# Patient Record
Sex: Female | Born: 1945
Health system: Southern US, Community
[De-identification: ages and names within clinical notes are randomized; demographics above are authoritative.]

## PROBLEM LIST (undated history)

## (undated) DIAGNOSIS — E782 Mixed hyperlipidemia: Secondary | ICD-10-CM

## (undated) DIAGNOSIS — Z974 Presence of external hearing-aid: Secondary | ICD-10-CM

## (undated) DIAGNOSIS — F32A Depression, unspecified: Secondary | ICD-10-CM

## (undated) DIAGNOSIS — E039 Hypothyroidism, unspecified: Secondary | ICD-10-CM

## (undated) DIAGNOSIS — I340 Nonrheumatic mitral (valve) insufficiency: Secondary | ICD-10-CM

## (undated) DIAGNOSIS — I5189 Other ill-defined heart diseases: Secondary | ICD-10-CM

## (undated) DIAGNOSIS — R911 Solitary pulmonary nodule: Secondary | ICD-10-CM

## (undated) DIAGNOSIS — I509 Heart failure, unspecified: Secondary | ICD-10-CM

## (undated) DIAGNOSIS — I10 Essential (primary) hypertension: Secondary | ICD-10-CM

## (undated) DIAGNOSIS — I071 Rheumatic tricuspid insufficiency: Secondary | ICD-10-CM

## (undated) DIAGNOSIS — I428 Other cardiomyopathies: Secondary | ICD-10-CM

## (undated) DIAGNOSIS — E079 Disorder of thyroid, unspecified: Secondary | ICD-10-CM

## (undated) HISTORY — DX: Disorder of thyroid, unspecified: E07.9

## (undated) HISTORY — DX: Solitary pulmonary nodule: R91.1

## (undated) HISTORY — DX: Nonrheumatic mitral (valve) insufficiency: I34.0

## (undated) HISTORY — DX: Rheumatic tricuspid insufficiency: I07.1

## (undated) HISTORY — PX: TONSILLECTOMY: SUR1361

## (undated) HISTORY — DX: Depression, unspecified: F32.A

## (undated) HISTORY — DX: Other cardiomyopathies: I42.8

## (undated) HISTORY — DX: Mixed hyperlipidemia: E78.2

## (undated) HISTORY — PX: APPENDECTOMY: SHX54

---

## 2013-12-26 HISTORY — PX: COLONOSCOPY: SHX174

## 2013-12-26 HISTORY — PX: LAPAROSCOPIC ILEOCECECTOMY: SHX5898

## 2014-03-26 DIAGNOSIS — M7500 Adhesive capsulitis of unspecified shoulder: Secondary | ICD-10-CM | POA: Insufficient documentation

## 2014-08-27 DIAGNOSIS — Z86010 Personal history of colonic polyps: Secondary | ICD-10-CM | POA: Insufficient documentation

## 2014-09-08 ENCOUNTER — Ambulatory Visit: Payer: Self-pay | Admitting: Gastroenterology

## 2014-10-09 ENCOUNTER — Ambulatory Visit: Payer: Self-pay | Admitting: Surgery

## 2014-10-09 LAB — CBC WITH DIFFERENTIAL/PLATELET
BASOS ABS: 0.1 10*3/uL (ref 0.0–0.1)
Basophil %: 1.3 %
Eosinophil #: 0.2 10*3/uL (ref 0.0–0.7)
Eosinophil %: 3.4 %
HCT: 39.8 % (ref 35.0–47.0)
HGB: 12.8 g/dL (ref 12.0–16.0)
LYMPHS ABS: 1.7 10*3/uL (ref 1.0–3.6)
Lymphocyte %: 34.9 %
MCH: 30.5 pg (ref 26.0–34.0)
MCHC: 32.3 g/dL (ref 32.0–36.0)
MCV: 95 fL (ref 80–100)
MONO ABS: 0.5 x10 3/mm (ref 0.2–0.9)
Monocyte %: 10.3 %
NEUTROS ABS: 2.5 10*3/uL (ref 1.4–6.5)
Neutrophil %: 50.1 %
PLATELETS: 228 10*3/uL (ref 150–440)
RBC: 4.21 10*6/uL (ref 3.80–5.20)
RDW: 12.4 % (ref 11.5–14.5)
WBC: 4.9 10*3/uL (ref 3.6–11.0)

## 2014-10-09 LAB — HEPATIC FUNCTION PANEL A (ARMC)
ALT: 19 U/L
Albumin: 3.7 g/dL (ref 3.4–5.0)
Alkaline Phosphatase: 76 U/L
BILIRUBIN TOTAL: 0.3 mg/dL (ref 0.2–1.0)
Bilirubin, Direct: <0.1 mg/dL (ref 0.00–0.20)
SGOT(AST): 12 U/L — ABNORMAL LOW (ref 15–37)
TOTAL PROTEIN: 7 g/dL (ref 6.4–8.2)

## 2014-10-09 LAB — BASIC METABOLIC PANEL
ANION GAP: 3 — AB (ref 7–16)
BUN: 13 mg/dL (ref 7–18)
CHLORIDE: 106 mmol/L (ref 98–107)
CREATININE: 0.66 mg/dL (ref 0.60–1.30)
Calcium, Total: 8.7 mg/dL (ref 8.5–10.1)
Co2: 31 mmol/L (ref 21–32)
EGFR (African American): >60
EGFR (Non-African Amer.): >60
Glucose: 73 mg/dL (ref 65–99)
OSMOLALITY: 278 (ref 275–301)
Potassium: 4.3 mmol/L (ref 3.5–5.1)
Sodium: 140 mmol/L (ref 136–145)

## 2014-10-13 ENCOUNTER — Inpatient Hospital Stay: Payer: Self-pay | Admitting: Surgery

## 2014-10-14 LAB — CBC WITH DIFFERENTIAL/PLATELET
Basophil #: 0 10*3/uL (ref 0.0–0.1)
Basophil %: 0.5 %
EOS ABS: 0 10*3/uL (ref 0.0–0.7)
EOS PCT: 0.3 %
HCT: 37.4 % (ref 35.0–47.0)
HGB: 12.6 g/dL (ref 12.0–16.0)
Lymphocyte #: 0.8 10*3/uL — ABNORMAL LOW (ref 1.0–3.6)
Lymphocyte %: 10.6 %
MCH: 31.2 pg (ref 26.0–34.0)
MCHC: 33.8 g/dL (ref 32.0–36.0)
MCV: 92 fL (ref 80–100)
MONO ABS: 0.3 x10 3/mm (ref 0.2–0.9)
Monocyte %: 3.8 %
NEUTROS ABS: 6.5 10*3/uL (ref 1.4–6.5)
Neutrophil %: 84.8 %
Platelet: 214 10*3/uL (ref 150–440)
RBC: 4.05 10*6/uL (ref 3.80–5.20)
RDW: 12.4 % (ref 11.5–14.5)
WBC: 7.7 10*3/uL (ref 3.6–11.0)

## 2014-10-14 LAB — BASIC METABOLIC PANEL
Anion Gap: 8 (ref 7–16)
BUN: 5 mg/dL — ABNORMAL LOW (ref 7–18)
CREATININE: 0.56 mg/dL — AB (ref 0.60–1.30)
Calcium, Total: 7.6 mg/dL — ABNORMAL LOW (ref 8.5–10.1)
Chloride: 96 mmol/L — ABNORMAL LOW (ref 98–107)
Co2: 28 mmol/L (ref 21–32)
Glucose: 101 mg/dL — ABNORMAL HIGH (ref 65–99)
OSMOLALITY: 262 (ref 275–301)
POTASSIUM: 3.8 mmol/L (ref 3.5–5.1)
Sodium: 132 mmol/L — ABNORMAL LOW (ref 136–145)

## 2015-01-15 DIAGNOSIS — Z1231 Encounter for screening mammogram for malignant neoplasm of breast: Secondary | ICD-10-CM | POA: Diagnosis not present

## 2015-03-30 DIAGNOSIS — D485 Neoplasm of uncertain behavior of skin: Secondary | ICD-10-CM | POA: Diagnosis not present

## 2015-03-30 DIAGNOSIS — Z08 Encounter for follow-up examination after completed treatment for malignant neoplasm: Secondary | ICD-10-CM | POA: Diagnosis not present

## 2015-03-30 DIAGNOSIS — Z85828 Personal history of other malignant neoplasm of skin: Secondary | ICD-10-CM | POA: Diagnosis not present

## 2015-03-30 DIAGNOSIS — Z1283 Encounter for screening for malignant neoplasm of skin: Secondary | ICD-10-CM | POA: Diagnosis not present

## 2015-03-30 DIAGNOSIS — B078 Other viral warts: Secondary | ICD-10-CM | POA: Diagnosis not present

## 2015-03-30 DIAGNOSIS — Z789 Other specified health status: Secondary | ICD-10-CM | POA: Diagnosis not present

## 2015-03-30 DIAGNOSIS — L570 Actinic keratosis: Secondary | ICD-10-CM | POA: Diagnosis not present

## 2015-04-18 NOTE — Discharge Summary (Signed)
PATIENT NAME:  Krista Peters, Krista Peters MR#:  [id number] DATE OF BIRTH:  08-18-1946  DATE OF ADMISSION:  10/13/2014 DATE OF DISCHARGE:  10/17/2014  DISCHARGE DIAGNOSES: 1.  Unresectable cecal polyp serrated status post laparoscopic ileocecectomy.  2.  History of irritable bowel syndrome.  3.  History of macular degeneration.  4.  History of urinary incontinence.  5.  History of tonsillectomy.   DISCHARGE MEDICATIONS ARE AS FOLLOWS:  Oxybutynin 5 p.o. daily, multivitamin one cap p.o. daily, Wellbutrin 150 mg p.o. daily, hyoscyamine 0.375 p.o. q. 12 hours p.r.n.   INDICATION FOR ADMISSION:  [name] is a pleasant 69 year old who was noted on colonoscopy to have a serrated polyp that was unresectable at her appendiceal orifice and her cecum. She was brought to the operating room for management of unresectable polyp with risk for transition to malignancy.   HOSPITAL COURSE AS FOLLOWS:  [name] underwent unremarkable colectomy on October 19. She was initially given clear liquids and IV fluids and PCA for pain. As she began having bowel function, she was advanced to a regular diet and PCA was converted to p.o. pain medications. At the time of discharge, she was not requiring any p.o. pain medications and was eating and having bowel movements, which were unremarkable.   DISCHARGE INSTRUCTIONS:  [name] is to follow up with me in approximately one week. She is to call or return to the ED if she has increased pain, nausea, vomiting, or redness or drainage from her incision.    ____________________________ Glena Norfolk Traver Meckes, MD cal:nb D: 10/27/2014 20:07:35 ET T: 10/28/2014 01:27:46 ET JOB#: [id number]  cc: Harrell Gave A. Hersel Mcmeen, MD, <Dictator> Floyde Parkins MD ELECTRONICALLY SIGNED 10/28/2014 7:07

## 2015-04-18 NOTE — Op Note (Signed)
PATIENT NAME:  Krista Peters, Krista Peters MR#:  [id number] DATE OF BIRTH:  1946/03/26  DATE OF PROCEDURE:  10/13/2014  REASON FOR SURGERY: Unresectable serrated appendiceal orifice polyp.   PROCEDURE PERFORMED:  Laparoscopic-assisted ileocecectomy.    ASSISTANT:  Nestor Lewandowsky, MD.    ANESTHESIA: General.   ESTIMATED BLOOD LOSS: 50 mL.     COMPLICATIONS: None.   SPECIMEN: Ileocecum.    INDICATION FOR SURGERY: Miss Plant is a pleasant 69 year old who presented with an unresectable serrated polyp at the base of her appendix. She was brought to the operating room for definitive resection of polyp which could require a limited colon resection.   DETAILS OF PROCEDURE:  Informed consent was obtained. [name] was brought to the operating room suite. She was induced. Endotracheal tube was placed, general anesthesia was administered. Her abdomen was prepped and draped in standard surgical fashion. A timeout was then performed correctly identifying the patient name, operative site, and procedure to be performed. An infraumbilical incision was made, this was deepened down to fascia. The fascia was incised. The peritoneum was entered. Two stay sutures were placed through the fasciotomy. The Hasson trocar was placed in the abdomen. The colon was evaluated and after placing a left lower quadrant 5 mm and a suprapubic 5 mm the right colon was mobilized up to the hepatic flexure and to the transverse flexure. After significant mobilization a small midline incision was made. The colon was brought out through this incision and the cecum was removed. It was ligated proximally and distally just proximal to the right colic arcade with a GIA 75 stapler. LigaSure and a harmonic scalpel was used to ligate the ileocolic vessel and after removal a GIA 75 was used to create a common channel between the terminal ileum and the right colon. After the common channel was created the enterocolostomy was closed with a TA 60 stapler.  A crotch stitch was placed to decrease tension on the anastomosis.  The anastomosis was then dropped in the belly.  The abdomen was irrigated with normal saline. The fascia was then closed with a running looped number 1 PDS ran from one direction and tied to itself at the other direction. Staples were then used to close the skin. Staples were used to close the skin at the previous port site. Sterile dressing was then placed over the wound. The patient was then awoken, extubated, and brought to the postanesthesia care unit. There were no immediate complications. Needle, sponge, and instrument counts were correct at the end of the procedure.    ____________________________ Glena Norfolk. Romney Compean, MD cal:bu D: 10/15/2014 09:36:58 ET T: 10/15/2014 16:12:54 ET JOB#: [id number]  cc: Harrell Gave A. Leyland Kenna, MD, <Dictator> Floyde Parkins MD ELECTRONICALLY SIGNED 10/28/2014 7:02

## 2015-04-20 LAB — SURGICAL PATHOLOGY

## 2015-05-29 ENCOUNTER — Other Ambulatory Visit: Payer: Self-pay | Admitting: Internal Medicine

## 2015-09-11 ENCOUNTER — Encounter: Payer: Self-pay | Admitting: Internal Medicine

## 2015-09-11 ENCOUNTER — Other Ambulatory Visit: Payer: Self-pay | Admitting: Internal Medicine

## 2015-09-11 ENCOUNTER — Other Ambulatory Visit: Payer: Self-pay

## 2015-09-11 ENCOUNTER — Ambulatory Visit (INDEPENDENT_AMBULATORY_CARE_PROVIDER_SITE_OTHER): Payer: Medicare Other | Admitting: Internal Medicine

## 2015-09-11 VITALS — BP 128/78 | HR 84 | Ht 66.0 in | Wt 130.4 lb

## 2015-09-11 DIAGNOSIS — N3941 Urge incontinence: Secondary | ICD-10-CM | POA: Insufficient documentation

## 2015-09-11 DIAGNOSIS — E781 Pure hyperglyceridemia: Secondary | ICD-10-CM | POA: Insufficient documentation

## 2015-09-11 DIAGNOSIS — K589 Irritable bowel syndrome without diarrhea: Secondary | ICD-10-CM | POA: Insufficient documentation

## 2015-09-11 DIAGNOSIS — E782 Mixed hyperlipidemia: Secondary | ICD-10-CM | POA: Insufficient documentation

## 2015-09-11 DIAGNOSIS — Z23 Encounter for immunization: Secondary | ICD-10-CM | POA: Diagnosis not present

## 2015-09-11 DIAGNOSIS — H04123 Dry eye syndrome of bilateral lacrimal glands: Secondary | ICD-10-CM | POA: Diagnosis not present

## 2015-09-11 DIAGNOSIS — F329 Major depressive disorder, single episode, unspecified: Secondary | ICD-10-CM

## 2015-09-11 DIAGNOSIS — S0300XA Dislocation of jaw, unspecified side, initial encounter: Secondary | ICD-10-CM

## 2015-09-11 DIAGNOSIS — F325 Major depressive disorder, single episode, in full remission: Secondary | ICD-10-CM | POA: Insufficient documentation

## 2015-09-11 MED ORDER — TIZANIDINE HCL 2 MG PO TABS: 2.0000 mg | ORAL_TABLET | Freq: Every day | ORAL | Status: DC

## 2015-09-11 NOTE — Progress Notes (Signed)
Date:  09/11/2015   Name:  Krista Peters   DOB:  09/08/1946   MRN:  [id number]   Chief Complaint: Headache Headache  This is a new problem. The current episode started more than 1 month ago. The problem has been unchanged. The pain is located in the occipital region. The quality of the pain is described as aching. The pain is moderate (worse in the am). Pertinent negatives include no coughing, eye pain, eye redness, fever or sore throat. Her past medical history is significant for TMJ. There is no history of hypertension, recent head traumas or sinus disease.   Dry mouth - patient complains of a dry mouth. She had a complete dental exam last week which was normal. Her mouth is not so dry to keep her from speaking. She does keep water with her at all times but is unsure how much she consumes in a day. She does take a hycosamine and ditropan but has been on them for some time.  Dry eyes -patient complains of slightly dry eyes she is aware of this problem from previous eye doctor visits. No disease has been diagnosed she has been recommended to use moisturizing eyedrops.  Review of Systems:  Review of Systems  Constitutional: Negative for fever, diaphoresis and fatigue.  HENT: Negative for dental problem, mouth sores and sore throat.   Eyes: Negative for pain, redness and visual disturbance.       Dry eyes  Respiratory: Negative for cough and shortness of breath.   Cardiovascular: Negative for chest pain, palpitations and leg swelling.  Skin: Negative for color change, rash and wound.       Dry, wrinkled skin.  Neurological: Positive for headaches.    Patient Active Problem List   Diagnosis Date Noted  . Urge incontinence of urine 09/11/2015  . Depression 09/11/2015  . Hypertriglyceridemia 09/11/2015  . Irritable bowel syndrome without diarrhea 09/11/2015  . Hx of adenomatous colonic polyps 08/27/2014  . Adhesive capsulitis 03/26/2014    Prior to Admission medications   Medication  Sig Start Date End Date Taking? Authorizing Provider  buPROPion (WELLBUTRIN XL) 150 MG 24 hr tablet TAKE 1 TABLET BY MOUTH DAILY 05/29/15  Yes [name], MD  hyoscyamine (LEVBID) 0.375 MG 12 hr tablet Take 0.375 mg by mouth. 08/02/12  Yes Historical Provider, MD  oxybutynin (DITROPAN) 5 MG tablet TAKE 1 TABLET BY MOUTH EVERY DAY 09/11/15  Yes [name], MD    Allergies  Allergen Reactions  . Naproxen Rash  . Penicillins Rash  . Pneumovax 23 [Pneumococcal Vac Polyvalent] Rash    Past Surgical History  Procedure Laterality Date  . Tonsillectomy    . Laparoscopic ileocecectomy  2015    Serrated adenoma  . Colonoscopy  2015    Large adenoma removed laparoscopically    Social History  Substance Use Topics  . Smoking status: Never Smoker   . Smokeless tobacco: None  . Alcohol Use: 1.2 oz/week    2 Standard drinks or equivalent per week     Medication list has been reviewed and updated.  Physical Examination:  Physical Exam  Constitutional: She is oriented to person, place, and time. She appears well-developed and well-nourished. No distress.  HENT:  Mouth/Throat: Mucous membranes are normal. Mucous membranes are not dry.    Neck: Normal range of motion. Neck supple. Carotid bruit is not present. No thyromegaly present.  Cardiovascular: Normal rate, regular rhythm and normal heart sounds.   Pulmonary/Chest: Effort normal and breath  sounds normal.  Musculoskeletal: She exhibits no edema.  TMJ joint click and abnormal excursion noted bilaterally No temporal artery tenderness or scalp muscle spasm appreciated  Neurological: She is alert and oriented to person, place, and time.  Skin: Skin is warm and dry. She is not diaphoretic. No pallor.  Slightly decreased skin turgor noted on both hands. No other skin abnormality is noted.    BP 128/78 mmHg  Pulse 84  Ht 5\' 6"  (1.676 m)  Wt 130 lb 6.4 oz (59.149 kg)  BMI 21.06 kg/m2  Assessment and Plan: 1. TMJ  (dislocation of temporomandibular joint), initial encounter Continue to wear bite guard at night - tiZANidine (ZANAFLEX) 2 MG tablet; Take 1 tablet (2 mg total) by mouth at bedtime.  Dispense: 30 tablet; Refill: 2  2. Need for influenza vaccination - Flu Vaccine QUAD 36+ mos IM  3. Dry eye, bilateral Continue moisturizing eyedrops as needed   Halina Maidens, MD [facility]  09/11/2015

## 2015-10-06 DIAGNOSIS — H353132 Nonexudative age-related macular degeneration, bilateral, intermediate dry stage: Secondary | ICD-10-CM | POA: Diagnosis not present

## 2015-10-13 ENCOUNTER — Other Ambulatory Visit: Payer: Self-pay | Admitting: Internal Medicine

## 2015-10-21 DIAGNOSIS — Z1283 Encounter for screening for malignant neoplasm of skin: Secondary | ICD-10-CM | POA: Diagnosis not present

## 2015-10-21 DIAGNOSIS — Z08 Encounter for follow-up examination after completed treatment for malignant neoplasm: Secondary | ICD-10-CM | POA: Diagnosis not present

## 2015-10-21 DIAGNOSIS — Z85828 Personal history of other malignant neoplasm of skin: Secondary | ICD-10-CM | POA: Diagnosis not present

## 2015-10-21 DIAGNOSIS — L821 Other seborrheic keratosis: Secondary | ICD-10-CM | POA: Diagnosis not present

## 2015-10-27 ENCOUNTER — Other Ambulatory Visit: Payer: Self-pay | Admitting: Internal Medicine

## 2016-01-08 ENCOUNTER — Other Ambulatory Visit: Payer: Self-pay | Admitting: Internal Medicine

## 2016-02-17 ENCOUNTER — Ambulatory Visit (INDEPENDENT_AMBULATORY_CARE_PROVIDER_SITE_OTHER): Payer: Medicare Other

## 2016-02-17 ENCOUNTER — Encounter: Payer: Self-pay | Admitting: Emergency Medicine

## 2016-02-17 ENCOUNTER — Ambulatory Visit
Admission: EM | Admit: 2016-02-17 | Discharge: 2016-02-17 | Disposition: A | Discharge status: Home / Self Care | Payer: Medicare Other | Attending: Family Medicine | Admitting: Family Medicine

## 2016-02-17 DIAGNOSIS — K219 Gastro-esophageal reflux disease without esophagitis: Secondary | ICD-10-CM | POA: Insufficient documentation

## 2016-02-17 DIAGNOSIS — R079 Chest pain, unspecified: Secondary | ICD-10-CM | POA: Diagnosis not present

## 2016-02-17 DIAGNOSIS — Z880 Allergy status to penicillin: Secondary | ICD-10-CM | POA: Insufficient documentation

## 2016-02-17 DIAGNOSIS — R51 Headache: Secondary | ICD-10-CM | POA: Diagnosis present

## 2016-02-17 DIAGNOSIS — Z79899 Other long term (current) drug therapy: Secondary | ICD-10-CM | POA: Diagnosis not present

## 2016-02-17 NOTE — ED Notes (Signed)
Pt reports awoke in middle of night with a sensation in chest described as burning. Pt reports resolved after taking tums but after going back to sleep awoke again with pressure in mid upper back described as intermittent. Pt denies pressure at this time reports "I just don't feel right and I have had this headache" Pt denies SOB or similar symptoms previously

## 2016-02-17 NOTE — ED Provider Notes (Signed)
CSN: [id number]     Arrival date & time 02/17/16  [id number] History   None    Chief Complaint  Patient presents with  . Headache   (Consider location/radiation/quality/duration/timing/severity/associated sxs/prior Treatment) HPI Comments: 70 yo female with a h/o GERD presents with a c/o burning chest sensation episodes (twice) last night. States pain seemed to radiate towards the back. Denies any palpitations, cough, injury, fevers, chills, diaphoresis. Currently not having any chest symptoms. States currently has a mild headache.  The history is provided by the patient.    No past medical history on file. Past Surgical History  Procedure Laterality Date  . Tonsillectomy    . Laparoscopic ileocecectomy  2015    Serrated adenoma  . Colonoscopy  2015    Large adenoma removed laparoscopically  . Appendectomy     Family History  Problem Relation Age of Onset  . Diabetes Mother   . Diabetes Brother   . CAD Father   . CAD Brother    Social History  Substance Use Topics  . Smoking status: Never Smoker   . Smokeless tobacco: None  . Alcohol Use: 1.2 oz/week    2 Standard drinks or equivalent per week   OB History    No data available     Review of Systems  Allergies  Naproxen; Penicillins; and Pneumovax 23  Home Medications   Prior to Admission medications   Medication Sig Start Date End Date Taking? Authorizing Provider  Multiple Vitamins-Minerals (PRESERVISION AREDS) CAPS Take by mouth.   Yes Historical Provider, MD  buPROPion (WELLBUTRIN XL) 150 MG 24 hr tablet TAKE 1 TABLET BY MOUTH EVERY DAY 10/27/15   [name], MD  hyoscyamine (LEVBID) 0.375 MG 12 hr tablet Take 0.375 mg by mouth. 08/02/12   Historical Provider, MD  hyoscyamine (LEVSIN, ANASPAZ) 0.125 MG tablet TAKE 1 TABLET BY MOUTH FOUR TIMES DAILY AS NEEDED 01/08/16   [name], MD  oxybutynin (DITROPAN) 5 MG tablet TAKE 1 TABLET BY MOUTH EVERY DAY 10/13/15   [name], MD  tiZANidine (ZANAFLEX) 2  MG tablet Take 1 tablet (2 mg total) by mouth at bedtime. 09/11/15   [name], MD   Meds Ordered and Administered this Visit  Medications - No data to display  BP 129/71 mmHg  Pulse 97  Temp(Src) 98.6 F (37 C) (Tympanic)  Resp 16  Ht 5\' 6"  (1.676 m)  Wt 130 lb (58.968 kg)  BMI 20.99 kg/m2  SpO2 100% No data found.   Physical Exam  Constitutional: She appears well-developed and well-nourished. No distress.  HENT:  Head: Normocephalic and atraumatic.  Right Ear: External ear normal.  Left Ear: External ear normal.  Nose: Nose normal. No nose lacerations, sinus tenderness, nasal deformity, septal deviation or nasal septal hematoma. No epistaxis.  No foreign bodies.  Mouth/Throat: Uvula is midline, oropharynx is clear and moist and mucous membranes are normal. No oropharyngeal exudate.  Eyes: Conjunctivae and EOM are normal. Pupils are equal, round, and reactive to light. Right eye exhibits no discharge. Left eye exhibits no discharge. No scleral icterus.  Neck: Normal range of motion. Neck supple. No thyromegaly present.  Cardiovascular: Normal rate, regular rhythm and normal heart sounds.   Pulmonary/Chest: Effort normal and breath sounds normal. No respiratory distress. She has no wheezes. She has no rales. She exhibits no tenderness.  Abdominal: Soft. Bowel sounds are normal. She exhibits no distension and no mass. There is no tenderness. There is no rebound and no  guarding.  Lymphadenopathy:    She has no cervical adenopathy.  Skin: She is not diaphoretic.  Nursing note and vitals reviewed.   ED Course  Procedures (including critical care time)  Labs Review Labs Reviewed - No data to display  Imaging Review Dg Chest 2 View  02/17/2016  CLINICAL DATA:  Upper back discomfort, former smoking history, chest pain EXAM: CHEST  2 VIEW COMPARISON:  Chest x-ray of 10/09/2014 FINDINGS: No active infiltrate or effusion is seen. The lungs are slightly hyperaerated.  Mediastinal and hilar contours are unremarkable. The heart is within normal limits in size. There are mild degenerative changes in the mid to lower thoracic spine. IMPRESSION: No active cardiopulmonary disease. Electronically Signed   By: Ivar Drape M.D.   On: 02/17/2016 10:10     Visual Acuity Review  Right Eye Distance:   Left Eye Distance:   Bilateral Distance:    Right Eye Near:   Left Eye Near:    Bilateral Near:       EKG: there are no previous tracings available for comparison, normal sinus rhythm; reviewed by me and agree   MDM   1. Gastroesophageal reflux disease, esophagitis presence not specified    Discharge Medication List as of 02/17/2016 11:09 AM     1. EKG and x-ray results and diagnosis reviewed with patient 2. rx as per orders above; reviewed possible side effects, interactions, risks and benefits  3. Recommend supportive treatment with otc H2 blocker or PPI prn 4. Follow-up prn if symptoms worsen or don't improve    Norval Gable, MD 02/17/16 2214

## 2016-07-07 ENCOUNTER — Ambulatory Visit (INDEPENDENT_AMBULATORY_CARE_PROVIDER_SITE_OTHER): Payer: Medicare Other | Admitting: Internal Medicine

## 2016-07-07 ENCOUNTER — Encounter: Payer: Self-pay | Admitting: Internal Medicine

## 2016-07-07 VITALS — BP 122/84 | HR 77 | Resp 16 | Ht 66.0 in | Wt 133.8 lb

## 2016-07-07 DIAGNOSIS — F329 Major depressive disorder, single episode, unspecified: Secondary | ICD-10-CM | POA: Diagnosis not present

## 2016-07-07 DIAGNOSIS — E781 Pure hyperglyceridemia: Secondary | ICD-10-CM | POA: Diagnosis not present

## 2016-07-07 DIAGNOSIS — Z1159 Encounter for screening for other viral diseases: Secondary | ICD-10-CM

## 2016-07-07 DIAGNOSIS — Z1239 Encounter for other screening for malignant neoplasm of breast: Secondary | ICD-10-CM

## 2016-07-07 DIAGNOSIS — Z0000 Encounter for general adult medical examination without abnormal findings: Secondary | ICD-10-CM | POA: Diagnosis not present

## 2016-07-07 DIAGNOSIS — K589 Irritable bowel syndrome without diarrhea: Secondary | ICD-10-CM | POA: Diagnosis not present

## 2016-07-07 DIAGNOSIS — F32A Depression, unspecified: Secondary | ICD-10-CM

## 2016-07-07 LAB — POCT URINALYSIS DIPSTICK
BILIRUBIN UA: NEGATIVE
GLUCOSE UA: NEGATIVE
Ketones, UA: NEGATIVE
Leukocytes, UA: NEGATIVE
Nitrite, UA: NEGATIVE
Protein, UA: NEGATIVE
RBC UA: NEGATIVE
SPEC GRAV UA: 1.01
pH, UA: 7

## 2016-07-07 MED ORDER — HYOSCYAMINE SULFATE 0.125 MG PO TABS: ORAL_TABLET | ORAL | Status: DC

## 2016-07-07 NOTE — Progress Notes (Signed)
Patient: Krista Peters, Female    DOB: 05/01/1946, 70 y.o.   MRN: [id number] Visit Date: 07/07/2016  Today's Provider: Halina Maidens, MD   Chief Complaint  Patient presents with  . Medicare Wellness   Subjective:    Annual wellness visit Krista Peters is a 70 y.o. female who presents today for her Subsequent Annual Wellness Visit. She feels well. She reports exercising walking. She reports she is sleeping well.   ----------------------------------------------------------- Depression        This is a chronic problem.  The problem has been resolved since onset.  Associated symptoms include no fatigue and no headaches.  Past treatments include other medications.  Compliance with treatment is good.  Previous treatment provided significant relief. Abdominal Cramping This is a recurrent problem. The problem occurs intermittently. The pain is located in the generalized abdominal region (diagnoses as IBS). Pertinent negatives include no arthralgias, constipation, diarrhea, dysuria, fever, frequency, headaches or vomiting. Treatments tried: Levsin PRN. Prior diagnostic workup includes lower endoscopy.    Review of Systems  Constitutional: Negative for fever, chills and fatigue.  HENT: Negative for congestion, hearing loss, tinnitus, trouble swallowing and voice change.   Eyes: Negative for visual disturbance.  Respiratory: Negative for cough, chest tightness, shortness of breath and wheezing.   Cardiovascular: Negative for chest pain, palpitations and leg swelling.  Gastrointestinal: Positive for abdominal distention. Negative for vomiting, abdominal pain, diarrhea and constipation.  Endocrine: Negative for polydipsia and polyuria.  Genitourinary: Negative for dysuria, frequency, vaginal bleeding, vaginal discharge and genital sores.  Musculoskeletal: Negative for joint swelling, arthralgias and gait problem.  Skin: Negative for color change and rash.  Neurological: Negative for  dizziness, tremors, light-headedness and headaches.  Hematological: Negative for adenopathy. Does not bruise/bleed easily.  Psychiatric/Behavioral: Positive for depression. Negative for sleep disturbance and dysphoric mood. The patient is not nervous/anxious.     Social History   Social History  . Marital Status: Married    Spouse Name: N/A  . Number of Children: N/A  . Years of Education: N/A   Occupational History  . Not on file.   Social History Main Topics  . Smoking status: Never Smoker   . Smokeless tobacco: Not on file  . Alcohol Use: 1.2 oz/week    2 Standard drinks or equivalent per week  . Drug Use: Not on file  . Sexual Activity: Not on file   Other Topics Concern  . Not on file   Social History Narrative    Patient Active Problem List   Diagnosis Date Noted  . Urge incontinence of urine 09/11/2015  . Depression 09/11/2015  . Hypertriglyceridemia 09/11/2015  . Irritable bowel syndrome without diarrhea 09/11/2015  . Hx of adenomatous colonic polyps 08/27/2014  . Adhesive capsulitis 03/26/2014    Past Surgical History  Procedure Laterality Date  . Tonsillectomy    . Laparoscopic ileocecectomy  2015    Serrated adenoma  . Colonoscopy  2015    Large adenoma removed laparoscopically  . Appendectomy      Her family history includes CAD in her brother and father; Diabetes in her brother and mother.    Previous Medications   BUPROPION (WELLBUTRIN XL) 150 MG 24 HR TABLET    TAKE 1 TABLET BY MOUTH EVERY DAY   HYOSCYAMINE (LEVBID) 0.375 MG 12 HR TABLET    Take 0.375 mg by mouth.   HYOSCYAMINE (LEVSIN, ANASPAZ) 0.125 MG TABLET    TAKE 1 TABLET BY MOUTH FOUR TIMES DAILY AS NEEDED  MULTIPLE VITAMINS-MINERALS (PRESERVISION AREDS) CAPS    Take by mouth.   OXYBUTYNIN (DITROPAN) 5 MG TABLET    TAKE 1 TABLET BY MOUTH EVERY DAY   TIZANIDINE (ZANAFLEX) 2 MG TABLET    Take 1 tablet (2 mg total) by mouth at bedtime.    Patient Care Team: [name], MD as PCP  - General (Family Medicine)     Objective:   Vitals: BP 122/84 mmHg  Pulse 77  Resp 16  Ht 5\' 6"  (1.676 m)  Wt 133 lb 12.8 oz (60.691 kg)  BMI 21.61 kg/m2  SpO2 97%  Physical Exam  Constitutional: She is oriented to person, place, and time. She appears well-developed and well-nourished. No distress.  HENT:  Head: Normocephalic and atraumatic.  Right Ear: Tympanic membrane and ear canal normal.  Left Ear: Tympanic membrane and ear canal normal.  Nose: Right sinus exhibits no maxillary sinus tenderness. Left sinus exhibits no maxillary sinus tenderness.  Mouth/Throat: Uvula is midline and oropharynx is clear and moist.  Eyes: Conjunctivae and EOM are normal. Right eye exhibits no discharge. Left eye exhibits no discharge. No scleral icterus.  Neck: Normal range of motion. Carotid bruit is not present. No erythema present. No thyromegaly present.  Cardiovascular: Normal rate, regular rhythm, normal heart sounds and normal pulses.   Pulmonary/Chest: Effort normal. No respiratory distress. She has no wheezes. Right breast exhibits no mass, no nipple discharge, no skin change and no tenderness. Left breast exhibits no mass, no nipple discharge, no skin change and no tenderness.  Abdominal: Soft. Bowel sounds are normal. There is no hepatosplenomegaly. There is generalized tenderness. There is no rigidity, no guarding and no CVA tenderness.  Musculoskeletal: Normal range of motion.  Lymphadenopathy:    She has no cervical adenopathy.    She has no axillary adenopathy.  Neurological: She is alert and oriented to person, place, and time. She has normal reflexes. No cranial nerve deficit or sensory deficit.  Skin: Skin is warm, dry and intact. No rash noted.  Psychiatric: She has a normal mood and affect. Her speech is normal and behavior is normal. Thought content normal.  Nursing note and vitals reviewed.   Activities of Daily Living In your present state of health, do you have any  difficulty performing the following activities: 07/07/2016 09/11/2015  Hearing? Tempie Donning  Vision? N N  Difficulty concentrating or making decisions? N N  Walking or climbing stairs? N N  Dressing or bathing? N N  Doing errands, shopping? N N  Preparing Food and eating ? N -  Using the Toilet? N -  In the past six months, have you accidently leaked urine? N -  Do you have problems with loss of bowel control? N -  Managing your Medications? N -  Managing your Finances? N -  Housekeeping or managing your Housekeeping? N -    Fall Risk Assessment Fall Risk  07/07/2016 09/11/2015  Falls in the past year? No No      Depression Screen PHQ 2/9 Scores 07/07/2016 09/11/2015  PHQ - 2 Score 0 1    Cognitive Testing - 6-CIT   Correct? Score   What year is it? yes 0 Yes = 0    No = 4  What month is it? yes 0 Yes = 0    No = 3  Remember:     Pia [address]au, [place], [place]     What time is it? yes 0 Yes = 0  No = 3  Count backwards from 20 to 1 yes 0 Correct = 0    1 error = 2   More than 1 error = 4  Say the months of the year in reverse. yes 0 Correct = 0    1 error = 2   More than 1 error = 4  What address did I ask you to remember? yes 0 Correct = 0  1 error = 2    2 error = 4    3 error = 6    4 error = 8    All wrong = 10       TOTAL SCORE  0/28   Interpretation:  Normal  Normal (0-7) Abnormal (8-28)        Medicare Annual Wellness Visit Summary:  Reviewed patient's Family Medical History Reviewed and updated list of patient's medical providers Assessment of cognitive impairment was done Assessed patient's functional ability Established a written schedule for health screening [place] Completed and Reviewed  Exercise Activities and Dietary recommendations Goals    None      Immunization History  Administered Date(s) Administered  . Influenza,inj,Quad PF,36+ Mos 09/11/2015  . Pneumococcal Polysaccharide-23 12/28/2011  . Tdap 12/27/2010  .  Zoster 07/26/2012    Health Maintenance  Topic Date Due  . Hepatitis C Screening  1946-11-03  . DEXA SCAN  07/13/2012  . PNA vac Low Risk Adult (1 of 2 - PCV13) 12/27/2012  . INFLUENZA VACCINE  07/26/2016  . MAMMOGRAM  01/15/2017  . TETANUS/TDAP  12/27/2020  . COLONOSCOPY  08/26/2024  . ZOSTAVAX  Completed     Discussed health benefits of physical activity, and encouraged her to engage in regular exercise appropriate for her age and condition.    ------------------------------------------------------------------------------------------------------------   Assessment & Plan:  1. Medicare annual wellness visit, subsequent Measures satisfied DEXA discussed - pt intolerant of bisphosphonates and not eager to take other medications - will continue high calcium diet and vitamin D - POCT urinalysis dipstick  2. Irritable bowel syndrome without diarrhea - hyoscyamine (LEVSIN, ANASPAZ) 0.125 MG tablet; TAKE 1 TABLET BY MOUTH FOUR TIMES DAILY AS NEEDED  Dispense: 30 tablet; Refill: 2 - CBC with Differential/Platelet - Comprehensive metabolic panel  3. Need for hepatitis C screening test - Hepatitis C antibody  4. Breast cancer screening Patient will schedule Mammogram annually at [facility]  5. Depression Doing well on current therapy - TSH  6. Hypertriglyceridemia Continue healthy diet and exercise - Lipid panel   Halina Maidens, MD [facility]  07/07/2016

## 2016-07-07 NOTE — Patient Instructions (Signed)
Health Maintenance  Topic Date Due  . MAMMOGRAM  01/16/2016  . DEXA SCAN  07/07/2017 (Originally 07/13/2012)  . PNA vac Low Risk Adult (1 of 2 - PCV13) 07/07/2026 (Originally 12/27/2012)  . INFLUENZA VACCINE  07/26/2016  . TETANUS/TDAP  12/27/2020  . COLONOSCOPY  08/26/2024  . ZOSTAVAX  Completed  . Hepatitis C Screening  Addressed   Breast Self-Awareness Practicing breast self-awareness may pick up problems early, prevent significant medical complications, and possibly save your life. By practicing breast self-awareness, you can become familiar with how your breasts look and feel and if your breasts are changing. This allows you to notice changes early. It can also offer you some reassurance that your breast health is good. One way to learn what is normal for your breasts and whether your breasts are changing is to do a breast self-exam. If you find a lump or something that was not present in the past, it is best to contact your caregiver right away. Other findings that should be evaluated by your caregiver include nipple discharge, especially if it is bloody; skin changes or reddening; areas where the skin seems to be pulled in (retracted); or new lumps and bumps. Breast pain is seldom associated with cancer (malignancy), but should also be evaluated by a caregiver. HOW TO PERFORM A BREAST SELF-EXAM The best time to examine your breasts is 5-7 days after your menstrual period is over. During menstruation, the breasts are lumpier, and it may be more difficult to pick up changes. If you do not menstruate, have reached menopause, or had your uterus removed (hysterectomy), you should examine your breasts at regular intervals, such as monthly. If you are breastfeeding, examine your breasts after a feeding or after using a breast pump. Breast implants do not decrease the risk for lumps or tumors, so continue to perform breast self-exams as recommended. Talk to your caregiver about how to determine the  difference between the implant and breast tissue. Also, talk about the amount of pressure you should use during the exam. Over time, you will become more familiar with the variations of your breasts and more comfortable with the exam. A breast self-exam requires you to remove all your clothes above the waist. 1. Look at your breasts and nipples. Stand in front of a mirror in a room with good lighting. With your hands on your hips, push your hands firmly downward. Look for a difference in shape, contour, and size from one breast to the other (asymmetry). Asymmetry includes puckers, dips, or bumps. Also, look for skin changes, such as reddened or scaly areas on the breasts. Look for nipple changes, such as discharge, dimpling, repositioning, or redness. 2. Carefully feel your breasts. This is best done either in the shower or tub while using soapy water or when flat on your back. Place the arm (on the side of the breast you are examining) above your head. Use the pads (not the fingertips) of your three middle fingers on your opposite hand to feel your breasts. Start in the underarm area and use  inch (2 cm) overlapping circles to feel your breast. Use 3 different levels of pressure (light, medium, and firm pressure) at each circle before moving to the next circle. The light pressure is needed to feel the tissue closest to the skin. The medium pressure will help to feel breast tissue a little deeper, while the firm pressure is needed to feel the tissue close to the ribs. Continue the overlapping circles, moving downward over  the breast until you feel your ribs below your breast. Then, move one finger-width towards the center of the body. Continue to use the  inch (2 cm) overlapping circles to feel your breast as you move slowly up toward the collar bone (clavicle) near the base of the neck. Continue the up and down exam using all 3 pressures until you reach the middle of the chest. Do this with each breast,  carefully feeling for lumps or changes. 3.  Keep a written record with breast changes or normal findings for each breast. By writing this information down, you do not need to depend only on memory for size, tenderness, or location. Write down where you are in your menstrual cycle, if you are still menstruating. Breast tissue can have some lumps or thick tissue. However, see your caregiver if you find anything that concerns you.  SEEK MEDICAL CARE IF:  You see a change in shape, contour, or size of your breasts or nipples.   You see skin changes, such as reddened or scaly areas on the breasts or nipples.   You have an unusual discharge from your nipples.   You feel a new lump or unusually thick areas.    This information is not intended to replace advice given to you by your health care provider. Make sure you discuss any questions you have with your health care provider.   Document Released: 12/12/2005 Document Revised: 11/28/2012 Document Reviewed: 03/28/2012 Elsevier Interactive Patient Education [employer].

## 2016-07-08 LAB — CBC WITH DIFFERENTIAL/PLATELET
BASOS: 1 %
Basophils Absolute: 0 10*3/uL (ref 0.0–0.2)
EOS (ABSOLUTE): 0.2 10*3/uL (ref 0.0–0.4)
EOS: 5 %
HEMATOCRIT: 41.4 % (ref 34.0–46.6)
HEMOGLOBIN: 13.3 g/dL (ref 11.1–15.9)
IMMATURE GRANS (ABS): 0 10*3/uL (ref 0.0–0.1)
Immature Granulocytes: 0 %
LYMPHS: 32 %
Lymphocytes Absolute: 1.5 10*3/uL (ref 0.7–3.1)
MCH: 30.6 pg (ref 26.6–33.0)
MCHC: 32.1 g/dL (ref 31.5–35.7)
MCV: 95 fL (ref 79–97)
MONOCYTES: 9 %
Monocytes Absolute: 0.4 10*3/uL (ref 0.1–0.9)
NEUTROS ABS: 2.5 10*3/uL (ref 1.4–7.0)
Neutrophils: 53 %
Platelets: 278 10*3/uL (ref 150–379)
RBC: 4.34 x10E6/uL (ref 3.77–5.28)
RDW: 13.2 % (ref 12.3–15.4)
WBC: 4.7 10*3/uL (ref 3.4–10.8)

## 2016-07-08 LAB — TSH: TSH: 1.63 u[IU]/mL (ref 0.450–4.500)

## 2016-07-08 LAB — HEPATITIS C ANTIBODY

## 2016-07-08 LAB — LIPID PANEL
CHOL/HDL RATIO: 3.1 ratio (ref 0.0–4.4)
Cholesterol, Total: 197 mg/dL (ref 100–199)
HDL: 63 mg/dL (ref >39)
LDL Calculated: 116 mg/dL — ABNORMAL HIGH (ref 0–99)
TRIGLYCERIDES: 91 mg/dL (ref 0–149)
VLDL Cholesterol Cal: 18 mg/dL (ref 5–40)

## 2016-07-08 LAB — COMPREHENSIVE METABOLIC PANEL
A/G RATIO: 1.7 (ref 1.2–2.2)
ALBUMIN: 4.3 g/dL (ref 3.6–4.8)
ALT: 11 IU/L (ref 0–32)
AST: 12 IU/L (ref 0–40)
Alkaline Phosphatase: 82 IU/L (ref 39–117)
BILIRUBIN TOTAL: 0.4 mg/dL (ref 0.0–1.2)
BUN/Creatinine Ratio: 18 (ref 12–28)
BUN: 13 mg/dL (ref 8–27)
CHLORIDE: 98 mmol/L (ref 96–106)
CO2: 26 mmol/L (ref 18–29)
Calcium: 9.2 mg/dL (ref 8.7–10.3)
Creatinine, Ser: 0.72 mg/dL (ref 0.57–1.00)
GFR calc non Af Amer: 86 mL/min/{1.73_m2} (ref >59)
GFR, EST AFRICAN AMERICAN: 100 mL/min/{1.73_m2} (ref >59)
Globulin, Total: 2.6 g/dL (ref 1.5–4.5)
Glucose: 71 mg/dL (ref 65–99)
POTASSIUM: 4.5 mmol/L (ref 3.5–5.2)
Sodium: 140 mmol/L (ref 134–144)
TOTAL PROTEIN: 6.9 g/dL (ref 6.0–8.5)

## 2016-07-18 DIAGNOSIS — Z1231 Encounter for screening mammogram for malignant neoplasm of breast: Secondary | ICD-10-CM | POA: Diagnosis not present

## 2016-07-27 ENCOUNTER — Encounter: Payer: Self-pay | Admitting: Internal Medicine

## 2016-09-16 ENCOUNTER — Other Ambulatory Visit: Payer: Self-pay | Admitting: Internal Medicine

## 2016-09-16 ENCOUNTER — Telehealth: Payer: Self-pay | Admitting: Internal Medicine

## 2016-09-16 DIAGNOSIS — M810 Age-related osteoporosis without current pathological fracture: Secondary | ICD-10-CM

## 2016-09-16 NOTE — Telephone Encounter (Signed)
Pt called said she would like to have a bone density test done said she would take meds if it was bad enough, pt said she isn't having any symptoms at this time.

## 2016-09-20 ENCOUNTER — Ambulatory Visit (INDEPENDENT_AMBULATORY_CARE_PROVIDER_SITE_OTHER): Payer: Medicare Other

## 2016-09-20 DIAGNOSIS — Z23 Encounter for immunization: Secondary | ICD-10-CM

## 2016-09-27 ENCOUNTER — Ambulatory Visit
Admission: RE | Admit: 2016-09-27 | Discharge: 2016-09-27 | Disposition: A | Discharge status: Home / Self Care | Payer: Medicare Other | Source: Ambulatory Visit | Attending: Internal Medicine | Admitting: Internal Medicine

## 2016-09-27 DIAGNOSIS — E2839 Other primary ovarian failure: Secondary | ICD-10-CM | POA: Diagnosis present

## 2016-09-27 DIAGNOSIS — M810 Age-related osteoporosis without current pathological fracture: Secondary | ICD-10-CM | POA: Diagnosis not present

## 2016-09-27 DIAGNOSIS — Z780 Asymptomatic menopausal state: Secondary | ICD-10-CM | POA: Diagnosis not present

## 2016-10-11 DIAGNOSIS — H353132 Nonexudative age-related macular degeneration, bilateral, intermediate dry stage: Secondary | ICD-10-CM | POA: Diagnosis not present

## 2016-10-12 ENCOUNTER — Other Ambulatory Visit: Payer: Self-pay | Admitting: Internal Medicine

## 2016-10-21 ENCOUNTER — Encounter: Payer: Self-pay | Admitting: Internal Medicine

## 2016-10-21 ENCOUNTER — Telehealth: Payer: Self-pay

## 2016-10-21 ENCOUNTER — Other Ambulatory Visit: Payer: Self-pay | Admitting: Internal Medicine

## 2016-10-21 DIAGNOSIS — M810 Age-related osteoporosis without current pathological fracture: Secondary | ICD-10-CM

## 2016-10-21 MED ORDER — ALENDRONATE SODIUM 70 MG PO TABS: 70.0000 mg | ORAL_TABLET | ORAL | 11 refills | Status: DC

## 2016-10-21 NOTE — Telephone Encounter (Signed)
Advised to add 1200 mg calcium to diet and add vit D suppl. 800mg  to meds. She can not do calcium OTC has to be in food only. Will start Fosomax please call in.

## 2016-10-26 ENCOUNTER — Other Ambulatory Visit: Payer: Self-pay | Admitting: Internal Medicine

## 2016-10-27 DIAGNOSIS — D485 Neoplasm of uncertain behavior of skin: Secondary | ICD-10-CM | POA: Diagnosis not present

## 2016-10-27 DIAGNOSIS — L821 Other seborrheic keratosis: Secondary | ICD-10-CM | POA: Diagnosis not present

## 2016-10-27 DIAGNOSIS — L570 Actinic keratosis: Secondary | ICD-10-CM | POA: Diagnosis not present

## 2016-10-27 DIAGNOSIS — D225 Melanocytic nevi of trunk: Secondary | ICD-10-CM | POA: Diagnosis not present

## 2016-10-27 DIAGNOSIS — D1801 Hemangioma of skin and subcutaneous tissue: Secondary | ICD-10-CM | POA: Diagnosis not present

## 2017-01-23 ENCOUNTER — Other Ambulatory Visit: Payer: Self-pay | Admitting: Internal Medicine

## 2017-01-23 NOTE — Telephone Encounter (Signed)
pts coming in on 7/26 for her cpe

## 2017-07-07 ENCOUNTER — Other Ambulatory Visit: Payer: Self-pay | Admitting: Internal Medicine

## 2017-07-20 ENCOUNTER — Encounter: Payer: Self-pay | Admitting: Internal Medicine

## 2017-07-29 ENCOUNTER — Other Ambulatory Visit: Payer: Self-pay | Admitting: Internal Medicine

## 2017-08-07 DIAGNOSIS — L821 Other seborrheic keratosis: Secondary | ICD-10-CM | POA: Diagnosis not present

## 2017-08-07 DIAGNOSIS — L570 Actinic keratosis: Secondary | ICD-10-CM | POA: Diagnosis not present

## 2017-08-07 DIAGNOSIS — Z859 Personal history of malignant neoplasm, unspecified: Secondary | ICD-10-CM | POA: Diagnosis not present

## 2017-08-07 DIAGNOSIS — L308 Other specified dermatitis: Secondary | ICD-10-CM | POA: Diagnosis not present

## 2017-08-11 DIAGNOSIS — Z1231 Encounter for screening mammogram for malignant neoplasm of breast: Secondary | ICD-10-CM | POA: Diagnosis not present

## 2017-08-26 ENCOUNTER — Other Ambulatory Visit: Payer: Self-pay | Admitting: Internal Medicine

## 2017-08-26 DIAGNOSIS — M810 Age-related osteoporosis without current pathological fracture: Secondary | ICD-10-CM

## 2017-08-31 ENCOUNTER — Ambulatory Visit (INDEPENDENT_AMBULATORY_CARE_PROVIDER_SITE_OTHER): Payer: Medicare Other | Admitting: Internal Medicine

## 2017-08-31 ENCOUNTER — Encounter: Payer: Self-pay | Admitting: Internal Medicine

## 2017-08-31 VITALS — BP 132/84 | HR 68 | Ht 66.0 in | Wt 137.2 lb

## 2017-08-31 DIAGNOSIS — K589 Irritable bowel syndrome without diarrhea: Secondary | ICD-10-CM | POA: Diagnosis not present

## 2017-08-31 DIAGNOSIS — Z23 Encounter for immunization: Secondary | ICD-10-CM | POA: Diagnosis not present

## 2017-08-31 DIAGNOSIS — E781 Pure hyperglyceridemia: Secondary | ICD-10-CM

## 2017-08-31 DIAGNOSIS — M810 Age-related osteoporosis without current pathological fracture: Secondary | ICD-10-CM

## 2017-08-31 DIAGNOSIS — Z86010 Personal history of colonic polyps: Secondary | ICD-10-CM | POA: Diagnosis not present

## 2017-08-31 DIAGNOSIS — F324 Major depressive disorder, single episode, in partial remission: Secondary | ICD-10-CM | POA: Diagnosis not present

## 2017-08-31 DIAGNOSIS — Z0000 Encounter for general adult medical examination without abnormal findings: Secondary | ICD-10-CM

## 2017-08-31 DIAGNOSIS — Z860101 Personal history of adenomatous and serrated colon polyps: Secondary | ICD-10-CM

## 2017-08-31 LAB — POCT URINALYSIS DIPSTICK
Bilirubin, UA: NEGATIVE
Blood, UA: NEGATIVE
Glucose, UA: NEGATIVE
KETONES UA: NEGATIVE
NITRITE UA: NEGATIVE
PH UA: 6.5 (ref 5.0–8.0)
PROTEIN UA: NEGATIVE
SPEC GRAV UA: 1.01 (ref 1.010–1.025)
Urobilinogen, UA: 0.2 E.U./dL

## 2017-08-31 NOTE — Patient Instructions (Signed)
Health Maintenance  Topic Date Due  . INFLUENZA VACCINE  09/25/2017 (Originally 07/26/2017)  . PNA vac Low Risk Adult (1 of 2 - PCV13) 07/07/2026 (Originally 12/27/2012)  . MAMMOGRAM  08/11/2018  . TETANUS/TDAP  12/27/2020  . COLONOSCOPY  08/26/2024  . DEXA SCAN  Completed  . Hepatitis C Screening  Completed

## 2017-08-31 NOTE — Progress Notes (Signed)
Patient: Krista Peters, Female    DOB: Mar 02, 1946, 71 y.o.   MRN: [id number] Visit Date: 08/31/2017  Today's Provider: Halina Maidens, MD   Chief Complaint  Patient presents with  . Medicare Wellness    PHQ9- 5. Feeling tired and little interest in doing things. Breast Exam.    Subjective:    Annual wellness visit Krista Peters is a 71 y.o. female who presents today for her Subsequent Annual Wellness Visit. She feels fairly well. She reports exercising walking daily. She reports she is sleeping well. Recent mammogram was normal.  ----------------------------------------------------------- HPI Osteoporosis - Patient had a DEXA scan last fall which showed osteoporosis. She started on Fosamax 70 mg weekly. She tolerated fairly well but feels as if it causes some mild stomach irritation and upset. Similar to episodes she had a number of years ago with a medication-induced pancreatitis. She denies vomiting, difficulty swallowing, or heartburn. IBS - patient also has irritable bowel syndrome for which she takes hycosamine prn. Stools are normal. No vomiting weight loss or other change in symptoms. Last colonoscopy was 3 years ago requiring surgical removal of a very large polyp. She is now due for follow-up colonoscopy. Mood disorder - currently on Wellbutrin and doing well. She sleeps well in her juice fairly good although she flushes slowed down a little bit. No chest pains palpitations or change in appetite or sleep.   Review of Systems  Constitutional: Negative for chills, fatigue, fever and unexpected weight change.  HENT: Negative for congestion, hearing loss, tinnitus, trouble swallowing and voice change.   Eyes: Negative for visual disturbance.  Respiratory: Negative for cough, chest tightness, shortness of breath and wheezing.   Cardiovascular: Negative for chest pain, palpitations and leg swelling.  Gastrointestinal: Positive for nausea. Negative for abdominal pain, blood in  stool, constipation, diarrhea and vomiting.  Endocrine: Negative for polydipsia and polyuria.  Genitourinary: Negative for dysuria, frequency, genital sores, vaginal bleeding and vaginal discharge.  Musculoskeletal: Negative for arthralgias, gait problem and joint swelling.  Skin: Negative for color change and rash.  Neurological: Negative for dizziness, tremors, light-headedness and headaches.  Hematological: Negative for adenopathy. Does not bruise/bleed easily.  Psychiatric/Behavioral: Negative for dysphoric mood, sleep disturbance and suicidal ideas. The patient is not nervous/anxious.     Social History   Social History  . Marital status: Married    Spouse name: N/A  . Number of children: N/A  . Years of education: N/A   Occupational History  . Not on file.   Social History Main Topics  . Smoking status: Never Smoker  . Smokeless tobacco: Never Used  . Alcohol use 1.2 oz/week    2 Standard drinks or equivalent per week  . Drug use: Unknown  . Sexual activity: Not on file   Other Topics Concern  . Not on file   Social History Narrative  . No narrative on file    Patient Active Problem List   Diagnosis Date Noted  . Osteoporosis of multiple sites without pathological fracture 10/21/2016  . Urge incontinence of urine 09/11/2015  . Depression 09/11/2015  . Hypertriglyceridemia 09/11/2015  . Irritable bowel syndrome without diarrhea 09/11/2015  . Hx of adenomatous colonic polyps 08/27/2014  . Adhesive capsulitis 03/26/2014    Past Surgical History:  Procedure Laterality Date  . APPENDECTOMY    . COLONOSCOPY  2015   Large adenoma removed laparoscopically  . LAPAROSCOPIC ILEOCECECTOMY  2015   Serrated adenoma  . TONSILLECTOMY      Her  family history includes CAD in her brother and father; Diabetes in her brother and mother.     Previous Medications   ALENDRONATE (FOSAMAX) 70 MG TABLET    TAKE 1 TABLET(70 MG) BY MOUTH EVERY 7 DAYS WITH A FULL GLASS OF WATER  AND ON AN EMPTY STOMACH   BUPROPION (WELLBUTRIN XL) 150 MG 24 HR TABLET    TAKE 1 TABLET BY MOUTH EVERY DAY   HYOSCYAMINE (LEVSIN, ANASPAZ) 0.125 MG TABLET    TAKE 1 TABLET BY MOUTH FOUR TIMES DAILY AS NEEDED   MULTIPLE VITAMINS-MINERALS (PRESERVISION AREDS) CAPS    Take by mouth.   OXYBUTYNIN (DITROPAN) 5 MG TABLET    TAKE 1 TABLET BY MOUTH EVERY DAY    Patient Care Team: [name], MD as PCP - General (Family Medicine) [facility] (Dermatology)      Objective:   Vitals: BP 132/84   Pulse 68   Ht 5\' 6"  (1.676 m)   Wt 137 lb 3.2 oz (62.2 kg)   BMI 22.14 kg/m   Physical Exam  Constitutional: She is oriented to person, place, and time. She appears well-developed and well-nourished. No distress.  HENT:  Head: Normocephalic and atraumatic.  Right Ear: Tympanic membrane and ear canal normal.  Left Ear: Tympanic membrane and ear canal normal.  Nose: Right sinus exhibits no maxillary sinus tenderness. Left sinus exhibits no maxillary sinus tenderness.  Mouth/Throat: Uvula is midline and oropharynx is clear and moist.  Eyes: Conjunctivae and EOM are normal. Right eye exhibits no discharge. Left eye exhibits no discharge. No scleral icterus.  Neck: Normal range of motion. Carotid bruit is not present. No erythema present. No thyromegaly present.  Cardiovascular: Normal rate, regular rhythm, normal heart sounds and normal pulses.   Pulmonary/Chest: Effort normal. No respiratory distress. She has no wheezes. Right breast exhibits no mass, no nipple discharge, no skin change and no tenderness. Left breast exhibits no mass, no nipple discharge, no skin change and no tenderness.  Abdominal: Soft. Bowel sounds are normal. There is no hepatosplenomegaly. There is no tenderness. There is no CVA tenderness.  Musculoskeletal: Normal range of motion.  Lymphadenopathy:    She has no cervical adenopathy.    She has no axillary adenopathy.  Neurological: She is alert and  oriented to person, place, and time. She has normal reflexes. No cranial nerve deficit or sensory deficit.  Skin: Skin is warm, dry and intact. No rash noted.  Psychiatric: She has a normal mood and affect. Her speech is normal and behavior is normal. Thought content normal.  Nursing note and vitals reviewed.   Activities of Daily Living In your present state of health, do you have any difficulty performing the following activities: 08/31/2017 08/31/2017  Hearing? N Y  Comment - Hearing Aids in Both ears.   Vision? N N  Comment - Pt wears glasses.   Difficulty concentrating or making decisions? N N  Walking or climbing stairs? N N  Dressing or bathing? N N  Doing errands, shopping? N N  Preparing Food and eating ? N -  Using the Toilet? N -  In the past six months, have you accidently leaked urine? Y -  Do you have problems with loss of bowel control? N -  Comment BM- comes out fast but smaller peices but otherwise normal.  -  Managing your Medications? N -  Managing your Finances? N -  Housekeeping or managing your Housekeeping? N -  Some recent data might be hidden  Fall Risk Assessment Fall Risk  08/31/2017 07/07/2016 09/11/2015  Falls in the past year? No No No     Depression Screen PHQ 2/9 Scores 08/31/2017 07/07/2016 09/11/2015  PHQ - 2 Score 2 0 1  PHQ- 9 Score 5 - -    6CIT Screen 08/31/2017  What Year? 0 points  What month? 0 points  What time? 0 points  Count back from 20 0 points  Months in reverse 0 points  Repeat phrase 0 points  Total Score 0    Medicare Annual Wellness Visit Summary:  Reviewed patient's Family Medical History Reviewed and updated list of patient's medical providers Assessment of cognitive impairment was done Assessed patient's functional ability Established a written schedule for health screening [place] Completed and Reviewed  Exercise Activities and Dietary recommendations Goals    None      Immunization  History  Administered Date(s) Administered  . Influenza,inj,Quad PF,6+ Mos 09/11/2015, 09/20/2016  . Pneumococcal Polysaccharide-23 12/28/2011  . Tdap 12/27/2010  . Zoster 07/26/2012    Health Maintenance  Topic Date Due  . INFLUENZA VACCINE  09/25/2017 (Originally 07/26/2017)  . PNA vac Low Risk Adult (1 of 2 - PCV13) 07/07/2026 (Originally 12/27/2012)  . MAMMOGRAM  08/11/2018  . TETANUS/TDAP  12/27/2020  . COLONOSCOPY  08/26/2024  . DEXA SCAN  Completed  . Hepatitis C Screening  Completed    Discussed health benefits of physical activity, and encouraged her to engage in regular exercise appropriate for her age and condition.    ------------------------------------------------------------------------------------------------------------  Assessment & Plan:  1. Medicare annual wellness visit, subsequent Measures satisfied - POCT Urinalysis Dipstick  2. Irritable bowel syndrome without diarrhea stable - CBC with Differential/Platelet - Comprehensive metabolic panel  3. Osteoporosis of multiple sites without pathological fracture Possible side effects to fosamax - recommend stopping for several months Continue vitamin D, high calcium diet and resume regular exercise  4. Hypertriglyceridemia Continue diet control - Lipid panel  5. Major depressive disorder with single episode, in partial remission ([place]) Doing fairly well on current medication No change for now - TSH  6. Need for influenza vaccination - Flu Vaccine QUAD 36+ mos IM  7. Hx of adenomatous colonic polyps - Ambulatory referral to Gastroenterology   No orders of the defined types were placed in this encounter.   Partially dictated using Editor, commissioning. Any errors are unintentional.  Halina Maidens, MD [facility]  08/31/2017

## 2017-09-01 LAB — LIPID PANEL
CHOLESTEROL TOTAL: 206 mg/dL — AB (ref 100–199)
Chol/HDL Ratio: 3.3 ratio (ref 0.0–4.4)
HDL: 63 mg/dL (ref >39)
LDL Calculated: 127 mg/dL — ABNORMAL HIGH (ref 0–99)
Triglycerides: 80 mg/dL (ref 0–149)
VLDL Cholesterol Cal: 16 mg/dL (ref 5–40)

## 2017-09-01 LAB — COMPREHENSIVE METABOLIC PANEL
ALK PHOS: 67 IU/L (ref 39–117)
ALT: 13 IU/L (ref 0–32)
AST: 19 IU/L (ref 0–40)
Albumin/Globulin Ratio: 1.7 (ref 1.2–2.2)
Albumin: 4.5 g/dL (ref 3.5–4.8)
BUN/Creatinine Ratio: 17 (ref 12–28)
BUN: 12 mg/dL (ref 8–27)
Bilirubin Total: 0.4 mg/dL (ref 0.0–1.2)
CO2: 25 mmol/L (ref 20–29)
Calcium: 9.1 mg/dL (ref 8.7–10.3)
Chloride: 98 mmol/L (ref 96–106)
Creatinine, Ser: 0.72 mg/dL (ref 0.57–1.00)
GFR calc Af Amer: 98 mL/min/{1.73_m2} (ref >59)
GFR calc non Af Amer: 85 mL/min/{1.73_m2} (ref >59)
GLUCOSE: 80 mg/dL (ref 65–99)
Globulin, Total: 2.6 g/dL (ref 1.5–4.5)
Potassium: 4.5 mmol/L (ref 3.5–5.2)
Sodium: 140 mmol/L (ref 134–144)
Total Protein: 7.1 g/dL (ref 6.0–8.5)

## 2017-09-01 LAB — CBC WITH DIFFERENTIAL/PLATELET
BASOS ABS: 0 10*3/uL (ref 0.0–0.2)
Basos: 1 %
EOS (ABSOLUTE): 0.2 10*3/uL (ref 0.0–0.4)
Eos: 5 %
HEMOGLOBIN: 13.4 g/dL (ref 11.1–15.9)
Hematocrit: 41.1 % (ref 34.0–46.6)
Immature Grans (Abs): 0 10*3/uL (ref 0.0–0.1)
Immature Granulocytes: 0 %
LYMPHS ABS: 1.8 10*3/uL (ref 0.7–3.1)
Lymphs: 34 %
MCH: 31.5 pg (ref 26.6–33.0)
MCHC: 32.6 g/dL (ref 31.5–35.7)
MCV: 97 fL (ref 79–97)
MONOCYTES: 10 %
MONOS ABS: 0.5 10*3/uL (ref 0.1–0.9)
Neutrophils Absolute: 2.6 10*3/uL (ref 1.4–7.0)
Neutrophils: 50 %
PLATELETS: 264 10*3/uL (ref 150–379)
RBC: 4.26 x10E6/uL (ref 3.77–5.28)
RDW: 13.5 % (ref 12.3–15.4)
WBC: 5.2 10*3/uL (ref 3.4–10.8)

## 2017-09-01 LAB — TSH: TSH: 1.3 u[IU]/mL (ref 0.450–4.500)

## 2017-09-18 ENCOUNTER — Telehealth: Payer: Self-pay | Admitting: Gastroenterology

## 2017-09-18 NOTE — Telephone Encounter (Signed)
Patient called to schedule her colonoscopy with Dr. Allen Norris

## 2017-09-19 NOTE — Telephone Encounter (Signed)
Returned patient's call to schedule.  LVM for callback.

## 2017-10-05 ENCOUNTER — Other Ambulatory Visit: Payer: Self-pay

## 2017-10-05 ENCOUNTER — Telehealth: Payer: Self-pay

## 2017-10-05 DIAGNOSIS — Z86010 Personal history of colonic polyps: Secondary | ICD-10-CM

## 2017-10-05 NOTE — Telephone Encounter (Signed)
Gastroenterology Pre-Procedure Review  Request Date: 11/12 Requesting Physician: Dr. Allen Norris  PATIENT REVIEW QUESTIONS: The patient responded to the following health history questions as indicated:    1. Are you having any GI issues? no 2. Do you have a personal history of Polyps? yes (3 yrs prior) 3. Do you have a family history of Colon Cancer or Polyps? no 4. Diabetes Mellitus? no 5. Joint replacements in the past 12 months?no 6. Major health problems in the past 3 months?no 7. Any artificial heart valves, MVP, or defibrillator?no    MEDICATIONS & ALLERGIES:    Patient reports the following regarding taking any anticoagulation/antiplatelet therapy:   Plavix, Coumadin, Eliquis, Xarelto, Lovenox, Pradaxa, Brilinta, or Effient? no Aspirin? nO  Patient confirms/reports the following medications:  Current Outpatient Prescriptions  Medication Sig Dispense Refill  . alendronate (FOSAMAX) 70 MG tablet TAKE 1 TABLET(70 MG) BY MOUTH EVERY 7 DAYS WITH A FULL GLASS OF WATER AND ON AN EMPTY STOMACH 12 tablet 11  . buPROPion (WELLBUTRIN XL) 150 MG 24 hr tablet TAKE 1 TABLET BY MOUTH EVERY DAY 90 tablet 0  . hyoscyamine (LEVSIN, ANASPAZ) 0.125 MG tablet TAKE 1 TABLET BY MOUTH FOUR TIMES DAILY AS NEEDED 30 tablet 2  . Multiple Vitamins-Minerals (PRESERVISION AREDS) CAPS Take by mouth.    . oxybutynin (DITROPAN) 5 MG tablet TAKE 1 TABLET BY MOUTH EVERY DAY 90 tablet 0   No current facility-administered medications for this visit.     Patient confirms/reports the following allergies:  Allergies  Allergen Reactions  . Naproxen Rash  . Penicillins Rash  . Pneumovax 23 [Pneumococcal Vac Polyvalent] Rash    No orders of the defined types were placed in this encounter.   AUTHORIZATION INFORMATION Primary Insurance: 1D#: Group #:  Secondary Insurance: 1D#: Group #:  SCHEDULE INFORMATION: Date: 11/12 Time: Location: [place]

## 2017-10-11 ENCOUNTER — Other Ambulatory Visit: Payer: Self-pay | Admitting: Internal Medicine

## 2017-10-25 ENCOUNTER — Other Ambulatory Visit: Payer: Self-pay | Admitting: Internal Medicine

## 2017-11-03 NOTE — Discharge Instructions (Signed)
General Anesthesia, Adult, Care After °These instructions provide you with information about caring for yourself after your procedure. Your health care provider may also give you more specific instructions. Your treatment has been planned according to current medical practices, but problems sometimes occur. Call your health care provider if you have any problems or questions after your procedure. °What can I expect after the procedure? °After the procedure, it is common to have: °· Vomiting. °· A sore throat. °· Mental slowness. ° °It is common to feel: °· Nauseous. °· Cold or shivery. °· Sleepy. °· Tired. °· Sore or achy, even in parts of your body where you did not have surgery. ° °Follow these instructions at home: °For at least 24 hours after the procedure: °· Do not: °? Participate in activities where you could fall or become injured. °? Drive. °? Use heavy machinery. °? Drink alcohol. °? Take sleeping pills or medicines that cause drowsiness. °? Make important decisions or sign legal documents. °? Take care of children on your own. °· Rest. °Eating and drinking °· If you vomit, drink water, juice, or soup when you can drink without vomiting. °· Drink enough fluid to keep your urine clear or pale yellow. °· Make sure you have little or no nausea before eating solid foods. °· Follow the diet recommended by your health care provider. °General instructions °· Have a responsible adult stay with you until you are awake and alert. °· Return to your normal activities as told by your health care provider. Ask your health care provider what activities are safe for you. °· Take over-the-counter and prescription medicines only as told by your health care provider. °· If you smoke, do not smoke without supervision. °· Keep all follow-up visits as told by your health care provider. This is important. °Contact a health care provider if: °· You continue to have nausea or vomiting at home, and medicines are not helpful. °· You  cannot drink fluids or start eating again. °· You cannot urinate after 8-12 hours. °· You develop a skin rash. °· You have fever. °· You have increasing redness at the site of your procedure. °Get help right away if: °· You have difficulty breathing. °· You have chest pain. °· You have unexpected bleeding. °· You feel that you are having a life-threatening or urgent problem. °This information is not intended to replace advice given to you by your health care provider. Make sure you discuss any questions you have with your health care provider. °Document Released: 03/20/2001 Document Revised: 05/16/2016 Document Reviewed: 11/26/2015 °Elsevier Interactive Patient Education © 2018 [employer] ° °

## 2017-11-06 ENCOUNTER — Ambulatory Visit: Payer: Medicare Other | Admitting: Anesthesiology

## 2017-11-06 ENCOUNTER — Ambulatory Visit
Admission: RE | Admit: 2017-11-06 | Discharge: 2017-11-06 | Disposition: A | Discharge status: Home / Self Care | Payer: Medicare Other | Source: Ambulatory Visit | Attending: Gastroenterology | Admitting: Gastroenterology

## 2017-11-06 ENCOUNTER — Encounter
Admission: RE | Disposition: A | Discharge status: Home / Self Care | Payer: Self-pay | Source: Ambulatory Visit | Attending: Gastroenterology

## 2017-11-06 DIAGNOSIS — Z79899 Other long term (current) drug therapy: Secondary | ICD-10-CM | POA: Insufficient documentation

## 2017-11-06 DIAGNOSIS — Z1211 Encounter for screening for malignant neoplasm of colon: Secondary | ICD-10-CM | POA: Diagnosis not present

## 2017-11-06 DIAGNOSIS — Z887 Allergy status to serum and vaccine status: Secondary | ICD-10-CM | POA: Diagnosis not present

## 2017-11-06 DIAGNOSIS — Z9049 Acquired absence of other specified parts of digestive tract: Secondary | ICD-10-CM | POA: Insufficient documentation

## 2017-11-06 DIAGNOSIS — Z886 Allergy status to analgesic agent status: Secondary | ICD-10-CM | POA: Insufficient documentation

## 2017-11-06 DIAGNOSIS — F329 Major depressive disorder, single episode, unspecified: Secondary | ICD-10-CM | POA: Diagnosis not present

## 2017-11-06 DIAGNOSIS — Z880 Allergy status to penicillin: Secondary | ICD-10-CM | POA: Diagnosis not present

## 2017-11-06 DIAGNOSIS — Z833 Family history of diabetes mellitus: Secondary | ICD-10-CM | POA: Diagnosis not present

## 2017-11-06 DIAGNOSIS — K5730 Diverticulosis of large intestine without perforation or abscess without bleeding: Secondary | ICD-10-CM | POA: Diagnosis not present

## 2017-11-06 DIAGNOSIS — Z9889 Other specified postprocedural states: Secondary | ICD-10-CM | POA: Insufficient documentation

## 2017-11-06 DIAGNOSIS — K648 Other hemorrhoids: Secondary | ICD-10-CM | POA: Insufficient documentation

## 2017-11-06 DIAGNOSIS — Z86010 Personal history of colonic polyps: Secondary | ICD-10-CM | POA: Diagnosis not present

## 2017-11-06 DIAGNOSIS — N319 Neuromuscular dysfunction of bladder, unspecified: Secondary | ICD-10-CM | POA: Diagnosis not present

## 2017-11-06 DIAGNOSIS — Z8249 Family history of ischemic heart disease and other diseases of the circulatory system: Secondary | ICD-10-CM | POA: Diagnosis not present

## 2017-11-06 HISTORY — DX: Presence of external hearing-aid: Z97.4

## 2017-11-06 HISTORY — PX: COLONOSCOPY WITH PROPOFOL: SHX5780

## 2017-11-06 SURGERY — COLONOSCOPY WITH PROPOFOL
Anesthesia: General | Site: Rectum | Wound class: Contaminated

## 2017-11-06 MED ORDER — LIDOCAINE HCL (CARDIAC) 20 MG/ML IV SOLN
INTRAVENOUS | Status: DC | PRN
  Administered 2017-11-06: 50 mg via INTRAVENOUS

## 2017-11-06 MED ORDER — LACTATED RINGERS IV SOLN
1000.0000 mL | INTRAVENOUS | Status: DC
  Administered 2017-11-06: 1000 mL via INTRAVENOUS

## 2017-11-06 MED ORDER — STERILE WATER FOR IRRIGATION IR SOLN
Status: DC | PRN
  Administered 2017-11-06: 09:00:00

## 2017-11-06 MED ORDER — PROPOFOL 10 MG/ML IV BOLUS
INTRAVENOUS | Status: DC | PRN
  Administered 2017-11-06: 40 mg via INTRAVENOUS
  Administered 2017-11-06: 80 mg via INTRAVENOUS
  Administered 2017-11-06: 40 mg via INTRAVENOUS

## 2017-11-06 SURGICAL SUPPLY — 23 items

## 2017-11-06 NOTE — Anesthesia Preprocedure Evaluation (Addendum)
Anesthesia Evaluation  Patient identified by MRN, date of birth, ID band Patient awake    Reviewed: Allergy & Precautions, NPO status , Patient's Chart, lab work & pertinent test results, reviewed documented beta blocker date and time   Airway Mallampati: II  TM Distance: >3 FB Neck ROM: Full    Dental no notable dental hx.    Pulmonary neg pulmonary ROS,    Pulmonary exam normal breath sounds clear to auscultation       Cardiovascular negative cardio ROS Normal cardiovascular exam Rhythm:Regular Rate:Normal     Neuro/Psych PSYCHIATRIC DISORDERS Depression negative neurological ROS     GI/Hepatic negative GI ROS, Neg liver ROS,   Endo/Other  negative endocrine ROS  Renal/GU negative Renal ROS Bladder dysfunction      Musculoskeletal negative musculoskeletal ROS (+)   Abdominal Normal abdominal exam  (+)  Abdomen: soft.    Peds  Hematology negative hematology ROS (+)   Anesthesia Other Findings   Reproductive/Obstetrics negative OB ROS                            Anesthesia Physical Anesthesia Plan  ASA: II  Anesthesia Plan: General   Post-op Pain Management:    Induction:   PONV Risk Score and Plan:   Airway Management Planned: Natural Airway  Additional Equipment: None  Intra-op Plan:   Post-operative Plan:   Informed Consent: I have reviewed the patients History and Physical, chart, labs and discussed the procedure including the risks, benefits and alternatives for the proposed anesthesia with the patient or authorized representative who has indicated his/her understanding and acceptance.     Plan Discussed with: CRNA, Anesthesiologist and Surgeon  Anesthesia Plan Comments:         Anesthesia Quick Evaluation

## 2017-11-06 NOTE — H&P (Signed)
Krista Lame, MD [facility] [address], [place], [place] [address] Phone:[phone] Fax : [phone]  Primary Care Physician:  [name], MD Primary Gastroenterologist:  Dr. Allen Norris  Pre-Procedure History & Physical: HPI:  Krista Peters is a 71 y.o. female is here for an colonoscopy.   Past Medical History:  Diagnosis Date  . Hearing aid worn    bilateral    Past Surgical History:  Procedure Laterality Date  . APPENDECTOMY    . COLONOSCOPY  2015   Large adenoma removed laparoscopically  . LAPAROSCOPIC ILEOCECECTOMY  2015   Serrated adenoma  . TONSILLECTOMY      Prior to Admission medications   Medication Sig Start Date End Date Taking? Authorizing Provider  buPROPion (WELLBUTRIN XL) 150 MG 24 hr tablet TAKE 1 TABLET BY MOUTH EVERY DAY 10/25/17  Yes [name], MD  Multiple Vitamins-Minerals (PRESERVISION AREDS) CAPS Take by mouth.   Yes [provider]  oxybutynin (DITROPAN) 5 MG tablet TAKE 1 TABLET BY MOUTH EVERY DAY 10/11/17  Yes [name], MD  alendronate (FOSAMAX) 70 MG tablet TAKE 1 TABLET(70 MG) BY MOUTH EVERY 7 DAYS WITH A FULL GLASS OF WATER AND ON AN EMPTY STOMACH Patient not taking: Reported on 10/20/2017 08/26/17   [name], MD  hyoscyamine (LEVSIN, ANASPAZ) 0.125 MG tablet TAKE 1 TABLET BY MOUTH FOUR TIMES DAILY AS NEEDED Patient not taking: Reported on 10/20/2017 07/07/16   [name], MD    Allergies as of 10/05/2017 - Review Complete 08/31/2017  Allergen Reaction Noted  . Naproxen Rash 09/11/2015  . Penicillins Rash 09/11/2015  . Pneumovax 23 [pneumococcal vac polyvalent] Rash 09/11/2015    Family History  Problem Relation Age of Onset  . Diabetes Mother   . Diabetes Brother   . CAD Father   . CAD Brother     Social History   Socioeconomic History  . Marital status: Married    Spouse name: Not on file  . Number of children: Not on file  . Years of education: Not on file  . Highest  education level: Not on file  Social Needs  . Financial resource strain: Not on file  . Food insecurity - worry: Not on file  . Food insecurity - inability: Not on file  . Transportation needs - medical: Not on file  . Transportation needs - non-medical: Not on file  Occupational History  . Not on file  Tobacco Use  . Smoking status: Never Smoker  . Smokeless tobacco: Never Used  Substance and Sexual Activity  . Alcohol use: Yes    Alcohol/week: 1.2 oz    Types: 2 Standard drinks or equivalent per week    Comment: occasionally  . Drug use: Not on file  . Sexual activity: Not on file  Other Topics Concern  . Not on file  Social History Narrative  . Not on file    Review of Systems: See HPI, otherwise negative ROS  Physical Exam: BP 135/76   Temp 98.1 F (36.7 C) (Tympanic)   Resp 16   Ht 5\' 6"  (1.676 m)   Wt 132 lb (59.9 kg)   SpO2 98%   BMI 21.31 kg/m  General:   Alert,  pleasant and cooperative in NAD Head:  Normocephalic and atraumatic. Neck:  Supple; no masses or thyromegaly. Lungs:  Clear throughout to auscultation.    Heart:  Regular rate and rhythm. Abdomen:  Soft, nontender and nondistended. Normal bowel sounds, without guarding,  and without rebound.   Neurologic:  Alert and  oriented x4;  grossly normal neurologically.  Impression/Plan: Krista Peters is here for an colonoscopy to be performed for history of colon polyp  Risks, benefits, limitations, and alternatives regarding  colonoscopy have been reviewed with the patient.  Questions have been answered.  All parties agreeable.   Krista Lame, MD  11/06/2017, 7:43 AM

## 2017-11-06 NOTE — Anesthesia Procedure Notes (Signed)
Date/Time: 11/06/2017 8:32 AM Performed by: Cameron Ali, CRNA Pre-anesthesia Checklist: Patient identified, Emergency Drugs available, Suction available, Timeout performed and Patient being monitored Patient Re-evaluated:Patient Re-evaluated prior to induction Oxygen Delivery Method: Nasal cannula Placement Confirmation: positive ETCO2

## 2017-11-06 NOTE — Transfer of Care (Signed)
Immediate Anesthesia Transfer of Care Note  Patient: Krista Peters  Procedure(s) Performed: COLONOSCOPY WITH PROPOFOL (N/A Rectum)  Patient Location: PACU  Anesthesia Type: General  Level of Consciousness: awake, alert  and patient cooperative  Airway and Oxygen Therapy: Patient Spontanous Breathing and Patient connected to supplemental oxygen  Post-op Assessment: Post-op Vital signs reviewed, Patient's Cardiovascular Status Stable, Respiratory Function Stable, Patent Airway and No signs of Nausea or vomiting  Post-op Vital Signs: Reviewed and stable  Complications: No apparent anesthesia complications

## 2017-11-06 NOTE — Op Note (Signed)
[facility] Gastroenterology Patient Name: Krista Peters Procedure Date: 11/06/2017 8:29 AM MRN: [id number] Account #: [id number] Date of Birth: 02-26-1946 Admit Type: Outpatient Age: 71 Room: [facility] OR ROOM 01 Gender: Female Note Status: Finalized Procedure:            Colonoscopy Indications:          High risk colon cancer surveillance: Personal history                        of colonic polyps Providers:            Lucilla Lame MD, MD Referring MD:         Halina Maidens, MD (Referring MD) Medicines:            Propofol per Anesthesia Complications:        No immediate complications. Procedure:            Pre-Anesthesia Assessment:                       - Prior to the procedure, a History and Physical was                        performed, and patient medications and allergies were                        reviewed. The patient's tolerance of previous                        anesthesia was also reviewed. The risks and benefits of                        the procedure and the sedation options and risks were                        discussed with the patient. All questions were                        answered, and informed consent was obtained. Prior                        Anticoagulants: The patient has taken no previous                        anticoagulant or antiplatelet agents. ASA Grade                        Assessment: II - A patient with mild systemic disease.                        After reviewing the risks and benefits, the patient was                        deemed in satisfactory condition to undergo the                        procedure.                       After obtaining informed consent, the colonoscope was  passed under direct vision. Throughout the procedure,                        the patient's blood pressure, pulse, and oxygen                        saturations were monitored continuously. The Olympus   Colonoscope 190 [id number]) was introduced through the                        anus and advanced to the the ileocolonic anastomosis.                        The colonoscopy was performed without difficulty. The                        patient tolerated the procedure well. The quality of                        the bowel preparation was excellent. Findings:      The perianal and digital rectal examinations were normal.      There was evidence of a prior end-to-side colo-colonic anastomosis in       the ascending colon. This was patent and was characterized by healthy       appearing mucosa.      Non-bleeding internal hemorrhoids were found during retroflexion. The       hemorrhoids were Grade I (internal hemorrhoids that do not prolapse).      Multiple small-mouthed diverticula were found in the sigmoid colon. Impression:           - Patent end-to-side colo-colonic anastomosis,                        characterized by healthy appearing mucosa.                       - Non-bleeding internal hemorrhoids.                       - Diverticulosis in the sigmoid colon.                       - No specimens collected. Recommendation:       - Discharge patient to home.                       - Resume previous diet.                       - Repeat colonoscopy in 5 years for surveillance. Procedure Code(s):    --- Professional ---                       [phone], Colonoscopy, flexible; diagnostic, including                        collection of specimen(s) by brushing or washing, when                        performed (separate procedure) Diagnosis Code(s):    --- Professional ---  Z86.010, Personal history of colonic polyps CPT copyright 2016 American Medical Association. All rights reserved. The codes documented in this report are preliminary and upon coder review may  be revised to meet current compliance requirements. Lucilla Lame MD, MD 11/06/2017 8:49:31 AM This report has been signed  electronically. Number of Addenda: 0 Note Initiated On: 11/06/2017 8:29 AM Scope Withdrawal Time: 0 hours 5 minutes 41 seconds  Total Procedure Duration: 0 hours 8 minutes 1 second       [facility]

## 2017-11-06 NOTE — Anesthesia Postprocedure Evaluation (Signed)
Anesthesia Post Note  Patient: Krista Peters  Procedure(s) Performed: COLONOSCOPY WITH PROPOFOL (N/A Rectum)  Patient location during evaluation: PACU Anesthesia Type: General Level of consciousness: awake Pain management: pain level controlled Vital Signs Assessment: post-procedure vital signs reviewed and stable Respiratory status: spontaneous breathing Cardiovascular status: blood pressure returned to baseline Postop Assessment: no headache Anesthetic complications: no    Lavonna Monarch

## 2017-11-07 ENCOUNTER — Encounter: Payer: Self-pay | Admitting: Gastroenterology

## 2018-01-22 ENCOUNTER — Other Ambulatory Visit: Payer: Self-pay | Admitting: Internal Medicine

## 2018-01-22 NOTE — Telephone Encounter (Signed)
Scheduled for September.

## 2018-04-04 ENCOUNTER — Telehealth: Payer: Self-pay

## 2018-04-04 NOTE — Telephone Encounter (Signed)
Called to reschedule 08/30/18 AWV. Program no longer available on Thursdays. LVM requesting returned call.

## 2018-04-06 NOTE — Telephone Encounter (Signed)
Called to reschedule 08/30/18 AWV. Program no longer available on Thursdays. LVM requesting returned call.

## 2018-04-09 NOTE — Telephone Encounter (Signed)
Called to reschedule 08/30/18 AWV. Program no longer available on Thursdays. LVM requesting returned call.

## 2018-04-10 ENCOUNTER — Other Ambulatory Visit: Payer: Self-pay | Admitting: Internal Medicine

## 2018-04-19 DIAGNOSIS — M1812 Unilateral primary osteoarthritis of first carpometacarpal joint, left hand: Secondary | ICD-10-CM | POA: Diagnosis not present

## 2018-04-19 DIAGNOSIS — R2232 Localized swelling, mass and lump, left upper limb: Secondary | ICD-10-CM | POA: Diagnosis not present

## 2018-04-19 DIAGNOSIS — M25542 Pain in joints of left hand: Secondary | ICD-10-CM | POA: Diagnosis not present

## 2018-04-28 DIAGNOSIS — R2232 Localized swelling, mass and lump, left upper limb: Secondary | ICD-10-CM | POA: Insufficient documentation

## 2018-04-30 DIAGNOSIS — D3612 Benign neoplasm of peripheral nerves and autonomic nervous system, upper limb, including shoulder: Secondary | ICD-10-CM | POA: Diagnosis not present

## 2018-04-30 DIAGNOSIS — Z87891 Personal history of nicotine dependence: Secondary | ICD-10-CM | POA: Diagnosis not present

## 2018-04-30 DIAGNOSIS — R2232 Localized swelling, mass and lump, left upper limb: Secondary | ICD-10-CM | POA: Diagnosis not present

## 2018-06-08 DIAGNOSIS — H353132 Nonexudative age-related macular degeneration, bilateral, intermediate dry stage: Secondary | ICD-10-CM | POA: Diagnosis not present

## 2018-08-30 ENCOUNTER — Ambulatory Visit: Payer: Self-pay

## 2018-08-30 ENCOUNTER — Ambulatory Visit (INDEPENDENT_AMBULATORY_CARE_PROVIDER_SITE_OTHER): Payer: Medicare Other | Admitting: Internal Medicine

## 2018-08-30 ENCOUNTER — Encounter: Payer: Self-pay | Admitting: Internal Medicine

## 2018-08-30 VITALS — BP 124/78 | HR 95 | Ht 66.0 in | Wt 136.0 lb

## 2018-08-30 DIAGNOSIS — K589 Irritable bowel syndrome without diarrhea: Secondary | ICD-10-CM

## 2018-08-30 DIAGNOSIS — Z0000 Encounter for general adult medical examination without abnormal findings: Secondary | ICD-10-CM | POA: Diagnosis not present

## 2018-08-30 DIAGNOSIS — M810 Age-related osteoporosis without current pathological fracture: Secondary | ICD-10-CM

## 2018-08-30 DIAGNOSIS — F324 Major depressive disorder, single episode, in partial remission: Secondary | ICD-10-CM

## 2018-08-30 DIAGNOSIS — Z1239 Encounter for other screening for malignant neoplasm of breast: Secondary | ICD-10-CM

## 2018-08-30 LAB — POCT URINALYSIS DIPSTICK
Bilirubin, UA: NEGATIVE
Glucose, UA: NEGATIVE
Ketones, UA: NEGATIVE
Nitrite, UA: NEGATIVE
PH UA: 6 (ref 5.0–8.0)
PROTEIN UA: NEGATIVE
RBC UA: NEGATIVE
Spec Grav, UA: 1.01 (ref 1.010–1.025)
Urobilinogen, UA: 0.2 E.U./dL

## 2018-08-30 MED ORDER — BUPROPION HCL ER (XL) 150 MG PO TB24
150.0000 mg | ORAL_TABLET | Freq: Every day | ORAL | 3 refills | Status: DC
Start: 2018-08-30 — End: 2019-10-07

## 2018-08-30 NOTE — Progress Notes (Signed)
Date:  08/30/2018   Name:  Krista Peters   DOB:  1946/05/09   MRN:  [id number]   Chief Complaint: Annual Exam (Breast Exam. Needs to void at end of visit.) Krista Peters is a 72 y.o. female who presents today for her Complete Annual Exam. She feels well. She reports exercising walking 5-8 K steps. She reports she is sleeping well.  She had mammogram at [facility] earlier this week - reported BiRads 0.  She had colonoscopy done in 2018 with recommendations for 5 yr follow up.  Depression         This is a chronic problem.The problem is unchanged.  Associated symptoms include no fatigue, no headaches and no suicidal ideas.  Past treatments include other medications.  Previous treatment provided significant relief.  IBS - mostly bloating and cramping.  Not taking any medication currently.  Bowels move normally with no bleeding, constipation or diarrhea.    Review of Systems  Constitutional: Negative for chills, fatigue and fever.  HENT: Positive for hearing loss. Negative for congestion, tinnitus, trouble swallowing and voice change.   Eyes: Negative for visual disturbance.  Respiratory: Negative for cough, chest tightness, shortness of breath and wheezing.   Cardiovascular: Negative for chest pain, palpitations and leg swelling.  Gastrointestinal: Positive for nausea. Negative for abdominal pain, constipation, diarrhea and vomiting.  Endocrine: Negative for polydipsia and polyuria.  Genitourinary: Negative for dysuria, frequency, genital sores, vaginal bleeding and vaginal discharge.  Musculoskeletal: Negative for arthralgias, gait problem and joint swelling.  Skin: Positive for rash (since getting shingles vaccine). Negative for color change.  Neurological: Negative for dizziness, tremors, light-headedness and headaches.  Hematological: Negative for adenopathy. Does not bruise/bleed easily.  Psychiatric/Behavioral: Positive for depression. Negative for dysphoric mood, sleep disturbance and  suicidal ideas. The patient is not nervous/anxious.     Patient Active Problem List   Diagnosis Date Noted  . Osteoporosis of multiple sites without pathological fracture 10/21/2016  . Urge incontinence of urine 09/11/2015  . Depression 09/11/2015  . Hypertriglyceridemia 09/11/2015  . Irritable bowel syndrome without diarrhea 09/11/2015  . Hx of adenomatous colonic polyps 08/27/2014  . Adhesive capsulitis 03/26/2014    Allergies  Allergen Reactions  . Naproxen Rash  . Penicillins Rash  . Pneumovax 23 [Pneumococcal Vac Polyvalent] Rash    Past Surgical History:  Procedure Laterality Date  . APPENDECTOMY    . COLONOSCOPY  2015   Large adenoma removed laparoscopically  . COLONOSCOPY WITH PROPOFOL N/A 11/06/2017   Procedure: COLONOSCOPY WITH PROPOFOL;  Surgeon: Lucilla Lame, MD;  Location: [facility];  Service: Endoscopy;  Laterality: N/A;  . LAPAROSCOPIC ILEOCECECTOMY  2015   Serrated adenoma  . TONSILLECTOMY      Social History   Tobacco Use  . Smoking status: Never Smoker  . Smokeless tobacco: Never Used  Substance Use Topics  . Alcohol use: Yes    Alcohol/week: 2.0 standard drinks    Types: 2 Standard drinks or equivalent per week    Comment: occasionally  . Drug use: Not on file     Medication list has been reviewed and updated.  Current Meds  Medication Sig  . buPROPion (WELLBUTRIN XL) 150 MG 24 hr tablet TAKE 1 TABLET BY MOUTH EVERY DAY  . Multiple Vitamins-Minerals (PRESERVISION AREDS) CAPS Take by mouth.  . oxybutynin (DITROPAN) 5 MG tablet TAKE 1 TABLET BY MOUTH EVERY DAY    PHQ 2/9 Scores 08/30/2018 08/31/2017 07/07/2016 09/11/2015  PHQ - 2 Score 0  2 0 1  PHQ- 9 Score 0 5 - -    Physical Exam  Constitutional: She is oriented to person, place, and time. She appears well-developed and well-nourished. No distress.  HENT:  Head: Normocephalic and atraumatic.  Right Ear: Tympanic membrane and ear canal normal.  Left Ear: Tympanic membrane and ear  canal normal.  Nose: Right sinus exhibits no maxillary sinus tenderness. Left sinus exhibits no maxillary sinus tenderness.  Mouth/Throat: Uvula is midline and oropharynx is clear and moist.  Eyes: Conjunctivae and EOM are normal. Right eye exhibits no discharge. Left eye exhibits no discharge. No scleral icterus.  Neck: Normal range of motion. Carotid bruit is not present. No erythema present. No thyromegaly present.  Cardiovascular: Normal rate, regular rhythm, normal heart sounds and normal pulses.  Pulmonary/Chest: Effort normal. No respiratory distress. She has no wheezes. Right breast exhibits no mass, no nipple discharge, no skin change and no tenderness. Left breast exhibits no mass, no nipple discharge, no skin change and no tenderness.  Abdominal: Soft. Bowel sounds are normal. There is no hepatosplenomegaly. There is no tenderness. There is no CVA tenderness.  Musculoskeletal: Normal range of motion.  Lymphadenopathy:    She has no cervical adenopathy.    She has no axillary adenopathy.  Neurological: She is alert and oriented to person, place, and time. She has normal reflexes. No cranial nerve deficit or sensory deficit.  Skin: Skin is warm, dry and intact. Rash noted.  Psychiatric: She has a normal mood and affect. Her speech is normal and behavior is normal. Thought content normal.  Nursing note and vitals reviewed.   BP 124/78 (BP Location: Right Arm, Patient Position: Sitting, Cuff Size: Normal)   Pulse 95   Ht 5\' 6"  (1.676 m)   Wt 136 lb (61.7 kg)   SpO2 96%   BMI 21.95 kg/m   Assessment and Plan: 1. Annual physical exam Continue healthy diet, regular exercise - Lipid panel - POCT urinalysis dipstick  2. Breast cancer screening Mammogram done recently was normal.  3. Major depressive disorder with single episode, in partial remission ([place]) Doing well on Bupropion - TSH - buPROPion (WELLBUTRIN XL) 150 MG 24 hr tablet; Take 1 tablet (150 mg total) by mouth daily.   Dispense: 90 tablet; Refill: 3  4. Irritable bowel syndrome without diarrhea Stable without medication - CBC with Differential/Platelet - Comprehensive metabolic panel  5. Osteoporosis of multiple sites without pathological fracture Did not tolerate Fosamax and not sure about taking Forteo, etc - VITAMIN D 25 Hydroxy (Vit-D Deficiency, Fractures) - DG Bone Density; Future   Meds ordered this encounter  Medications  . buPROPion (WELLBUTRIN XL) 150 MG 24 hr tablet    Sig: Take 1 tablet (150 mg total) by mouth daily.    Dispense:  90 tablet    Refill:  3    Partially dictated using Editor, commissioning. Any errors are unintentional.  Halina Maidens, MD [facility]  08/30/2018

## 2018-08-31 LAB — LIPID PANEL
CHOLESTEROL TOTAL: 197 mg/dL (ref 100–199)
Chol/HDL Ratio: 2.9 ratio (ref 0.0–4.4)
HDL: 69 mg/dL (ref >39)
LDL Calculated: 115 mg/dL — ABNORMAL HIGH (ref 0–99)
Triglycerides: 67 mg/dL (ref 0–149)
VLDL Cholesterol Cal: 13 mg/dL (ref 5–40)

## 2018-08-31 LAB — COMPREHENSIVE METABOLIC PANEL
ALBUMIN: 4.2 g/dL (ref 3.5–4.8)
ALK PHOS: 76 IU/L (ref 39–117)
ALT: 14 IU/L (ref 0–32)
AST: 15 IU/L (ref 0–40)
Albumin/Globulin Ratio: 1.6 (ref 1.2–2.2)
BILIRUBIN TOTAL: 0.3 mg/dL (ref 0.0–1.2)
BUN / CREAT RATIO: 17 (ref 12–28)
BUN: 12 mg/dL (ref 8–27)
CO2: 23 mmol/L (ref 20–29)
CREATININE: 0.69 mg/dL (ref 0.57–1.00)
Calcium: 8.9 mg/dL (ref 8.7–10.3)
Chloride: 99 mmol/L (ref 96–106)
GFR calc Af Amer: 101 mL/min/{1.73_m2} (ref >59)
GFR calc non Af Amer: 88 mL/min/{1.73_m2} (ref >59)
GLOBULIN, TOTAL: 2.6 g/dL (ref 1.5–4.5)
Glucose: 83 mg/dL (ref 65–99)
Potassium: 4.6 mmol/L (ref 3.5–5.2)
SODIUM: 139 mmol/L (ref 134–144)
Total Protein: 6.8 g/dL (ref 6.0–8.5)

## 2018-08-31 LAB — CBC WITH DIFFERENTIAL/PLATELET
BASOS: 1 %
Basophils Absolute: 0 10*3/uL (ref 0.0–0.2)
EOS (ABSOLUTE): 0.2 10*3/uL (ref 0.0–0.4)
Eos: 4 %
Hematocrit: 39.4 % (ref 34.0–46.6)
Hemoglobin: 13 g/dL (ref 11.1–15.9)
Immature Grans (Abs): 0 10*3/uL (ref 0.0–0.1)
Immature Granulocytes: 0 %
Lymphocytes Absolute: 1.1 10*3/uL (ref 0.7–3.1)
Lymphs: 25 %
MCH: 30.9 pg (ref 26.6–33.0)
MCHC: 33 g/dL (ref 31.5–35.7)
MCV: 94 fL (ref 79–97)
MONOS ABS: 0.4 10*3/uL (ref 0.1–0.9)
Monocytes: 10 %
NEUTROS ABS: 2.7 10*3/uL (ref 1.4–7.0)
Neutrophils: 60 %
PLATELETS: 235 10*3/uL (ref 150–450)
RBC: 4.21 x10E6/uL (ref 3.77–5.28)
RDW: 12.6 % (ref 12.3–15.4)
WBC: 4.5 10*3/uL (ref 3.4–10.8)

## 2018-08-31 LAB — TSH: TSH: 1.91 u[IU]/mL (ref 0.450–4.500)

## 2018-08-31 LAB — VITAMIN D 25 HYDROXY (VIT D DEFICIENCY, FRACTURES): VIT D 25 HYDROXY: 40.3 ng/mL (ref 30.0–100.0)

## 2018-09-03 ENCOUNTER — Ambulatory Visit (INDEPENDENT_AMBULATORY_CARE_PROVIDER_SITE_OTHER): Payer: Medicare Other

## 2018-09-03 VITALS — BP 140/72 | HR 67 | Temp 98.0°F | Resp 12 | Ht 66.0 in | Wt 135.8 lb

## 2018-09-03 DIAGNOSIS — Z0000 Encounter for general adult medical examination without abnormal findings: Secondary | ICD-10-CM | POA: Diagnosis not present

## 2018-09-03 NOTE — Progress Notes (Signed)
Subjective:   Krista Peters is a 72 y.o. female who presents for Medicare Annual (Subsequent) preventive examination.  Review of Systems:  N/A Cardiac Risk Factors include: advanced age (>70men, >57 women);sedentary lifestyle     Objective:     Vitals: BP 140/72 (BP Location: Right Arm, Patient Position: Sitting, Cuff Size: Normal)   Pulse 67   Temp 98 F (36.7 C) (Oral)   Resp 12   Ht 5\' 6"  (1.676 m)   Wt 135 lb 12.8 oz (61.6 kg)   SpO2 98%   BMI 21.92 kg/m   Body mass index is 21.92 kg/m.  Advanced Directives 09/03/2018 11/06/2017 08/31/2017 07/07/2016  Does Patient Have a Medical Advance Directive? Yes Yes Yes Yes  Type of Paramedic of [place];Living will Healthcare Power of [place] Living will [place];Living will  Does patient want to make changes to medical advance directive? - No - Patient declined - -  Copy of [place] in Chart? Yes Yes - -    Tobacco Social History   Tobacco Use  Smoking Status Former Smoker  . Packs/day: 0.25  . Years: 10.00  . Pack years: 2.50  . Types: Cigarettes  . Last attempt to quit: 1970  . Years since quitting: 49.7  Smokeless Tobacco Never Used  Tobacco Comment   smoking cessation materials not required     Counseling given: No Comment: smoking cessation materials not required  Clinical Intake:  Pre-visit preparation completed: Yes  Pain : No/denies pain   BMI - recorded: 21.92 Nutritional Status: BMI of 19-24  Normal Nutritional Risks: None Diabetes: No  How often do you need to have someone help you when you read instructions, pamphlets, or other written materials from your doctor or pharmacy?: 1 - Never  Interpreter Needed?: No  Information entered by :: AEversole, LPN  Past Medical History:  Diagnosis Date  . Hearing aid worn    bilateral   Past Surgical History:  Procedure Laterality Date  . APPENDECTOMY    . COLONOSCOPY  2015   Large  adenoma removed laparoscopically  . COLONOSCOPY WITH PROPOFOL N/A 11/06/2017   Procedure: COLONOSCOPY WITH PROPOFOL;  Surgeon: Lucilla Lame, MD;  Location: [facility];  Service: Endoscopy;  Laterality: N/A;  . LAPAROSCOPIC ILEOCECECTOMY  2015   Serrated adenoma  . TONSILLECTOMY     Family History  Problem Relation Age of Onset  . Diabetes Mother   . Diabetes Brother   . CAD Father   . CAD Brother    Social History   Socioeconomic History  . Marital status: Married    Spouse name: Not on file  . Number of children: 4  . Years of education: Not on file  . Highest education level: 12th grade  Occupational History  . Occupation: Retired  Scientific laboratory technician  . Financial resource strain: Not hard at all  . Food insecurity:    Worry: Never true    Inability: Never true  . Transportation needs:    Medical: No    Non-medical: No  Tobacco Use  . Smoking status: Former Smoker    Packs/day: 0.25    Years: 10.00    Pack years: 2.50    Types: Cigarettes    Last attempt to quit: 1970    Years since quitting: 49.7  . Smokeless tobacco: Never Used  . Tobacco comment: smoking cessation materials not required  Substance and Sexual Activity  . Alcohol use: Yes  Comment: occasionally  . Drug use: Never  . Sexual activity: Not Currently  Lifestyle  . Physical activity:    Days per week: 7 days    Minutes per session: 20 min  . Stress: Not at all  Relationships  . Social connections:    Talks on phone: Patient refused    Gets together: Patient refused    Attends religious service: Patient refused    Active member of club or organization: Patient refused    Attends meetings of clubs or organizations: Patient refused    Relationship status: Married  Other Topics Concern  . Not on file  Social History Narrative  . Not on file    Outpatient Encounter Medications as of 09/03/2018  Medication Sig  . buPROPion (WELLBUTRIN XL) 150 MG 24 hr tablet Take 1 tablet (150 mg total)  by mouth daily.  . Multiple Vitamins-Minerals (PRESERVISION AREDS) CAPS Take by mouth.  . oxybutynin (DITROPAN) 5 MG tablet TAKE 1 TABLET BY MOUTH EVERY DAY   No facility-administered encounter medications on file as of 09/03/2018.     Activities of Daily Living In your present state of health, do you have any difficulty performing the following activities: 09/03/2018 11/06/2017  Hearing? N Y  Comment B hearing aids -  Vision? N N  Comment wears eyeglasses -  Difficulty concentrating or making decisions? N N  Walking or climbing stairs? N N  Dressing or bathing? N N  Doing errands, shopping? N -  Preparing Food and eating ? N -  Comment denies dentures -  Using the Toilet? N -  In the past six months, have you accidently leaked urine? N -  Do you have problems with loss of bowel control? N -  Managing your Medications? N -  Managing your Finances? N -  Housekeeping or managing your Housekeeping? N -  Some recent data might be hidden    Patient Care Team: [name], MD as PCP - General (Family Medicine) [facility] as Consulting Physician (Dermatology)    Assessment:   This is a routine wellness examination for [name].  Exercise Activities and Dietary recommendations Current Exercise Habits: The patient does not participate in regular exercise at present;Home exercise routine, Time (Minutes): 20, Frequency (Times/Week): 7, Weekly Exercise (Minutes/Week): 140, Intensity: Mild, Exercise limited by: None identified  Goals    . DIET - INCREASE WATER INTAKE     Recommend to drink at least 6-8 8oz glasses of water per day.       Fall Risk Fall Risk  09/03/2018 08/31/2017 07/07/2016 09/11/2015  Falls in the past year? No No No No  Risk for fall due to : Impaired vision - - -  Risk for fall due to: Comment wears eyeglasses - - -   FALL RISK PREVENTION PERTAINING TO HOME: Is your home free of loose throw rugs in walkways, pet beds, electrical cords, etc? Yes Is  there adequate lighting in your home to reduce risk of falls?  Yes Are there stairs in or around your home WITH handrails? No stairs  ASSISTIVE DEVICES UTILIZED TO PREVENT FALLS: Use of a cane, walker or w/c? No Grab bars in the bathroom? Yes  Shower chair or a place to sit while bathing? Yes An elevated toilet seat or a handicapped toilet? Yes  Timed Get Up and Go Performed: Yes. Pt ambulated 10 feet within 7 sec. Gait stead-fast and without the use of an assistive device. No intervention required at this time. Fall  risk prevention has been discussed.  Community Resource Referral:  [place] Referral not required at this time.   Depression Screen PHQ 2/9 Scores 09/03/2018 08/30/2018 08/31/2017 07/07/2016  PHQ - 2 Score 0 0 2 0  PHQ- 9 Score 0 0 5 -     Cognitive Function     6CIT Screen 09/03/2018 08/31/2017  What Year? 0 points 0 points  What month? 0 points 0 points  What time? 0 points 0 points  Count back from 20 0 points 0 points  Months in reverse 0 points 0 points  Repeat phrase 0 points 0 points  Total Score 0 0    Immunization History  Administered Date(s) Administered  . Influenza, High Dose Seasonal PF 09/25/2013  . Influenza,inj,Quad PF,6+ Mos 09/11/2015, 09/20/2016, 08/31/2017  . Pneumococcal Polysaccharide-23 12/28/2011, 11/18/2013  . Tdap 12/27/2010  . Zoster 08/02/2012  . Zoster Recombinat (Shingrix) 06/09/2018, 08/28/2018    Qualifies for Shingles Vaccine? No. Completed Shingrix series  Due for Flu vaccine. Declined my offer to administer today. Education has been provided regarding the importance of this vaccine but still declined. States she had an adverse reaction to the Shingrix and was advised by Dr. [name] to wait to receive until Oct 2019. Advised may receive this vaccine at local pharmacy or Health Dept. Aware to provide a copy of the vaccination record if obtained from local pharmacy or Health Dept. Verbalized acceptance and  understanding.  Screening Tests Health Maintenance  Topic Date Due  . INFLUENZA VACCINE  04/23/2019 (Originally 07/26/2018)  . MAMMOGRAM  08/29/2019  . TETANUS/TDAP  12/27/2020  . COLONOSCOPY  11/06/2022  . DEXA SCAN  Completed  . Hepatitis C Screening  Completed    Cancer Screenings: Lung: Low Dose CT Chest recommended if Age 34-80 years, 30 pack-year currently smoking OR have quit w/in 15years. Patient does not qualify. Breast:  Up to date on Mammogram? No. Completed 08/28/18. Repeat every year.   Up to date of Bone Density/Dexa? Yes. Completed 09/27/16. Results reflect osteopenia and osteoporosis. Repeat every 2 years. Ordered by Dr. [name] on 08/30/18. Colorectal: Completed 11/06/17. Repeat every 5 years  Additional Screenings: Hepatitis C Screening: Completed 07/07/16    Plan:  I have personally reviewed and addressed the Medicare Annual Wellness questionnaire and have noted the following in the patient's chart:  A. Medical and social history B. Use of alcohol, tobacco or illicit drugs  C. Current medications and supplements D. Functional ability and status E.  Nutritional status F.  Physical activity G. Advance directives H. List of other physicians I.  Hospitalizations, surgeries, and ER visits in previous 12 months J.  [place] such as hearing and vision if needed, cognitive and depression L. Referrals and appointments  In addition, I have reviewed and discussed with patient certain preventive protocols, quality metrics, and best practice recommendations. A written personalized care plan for preventive services as well as general preventive health recommendations were provided to patient.  Signed,  Aleatha Borer, LPN Nurse Health Advisor  MD Recommendations: Due for Flu vaccine. Declined my offer to administer today. Education has been provided regarding the importance of this vaccine but still declined. States she had an adverse reaction to the Shingrix and  was advised by Dr. [name] to wait to receive until Oct 2019. Advised may receive this vaccine at local pharmacy or Health Dept. Aware to provide a copy of the vaccination record if obtained from local pharmacy or Health Dept. Verbalized acceptance and understanding.  Bone Density/Dexa:  Completed 09/27/16. Results reflect osteopenia and osteoporosis. Repeat every 2 years. Ordered by Dr. [name] on 08/30/18.

## 2018-09-03 NOTE — Patient Instructions (Signed)
[name] , Thank you for taking time to come for your Medicare Wellness Visit. I appreciate your ongoing commitment to your health goals. Please review the following plan we discussed and let me know if I can assist you in the future.   Screening recommendations/referrals: Colorectal Screening: Up to date Mammogram: Up to date Bone Density: You will receive a call from our office regarding your appointment  Vision and Dental Exams: Recommended annual ophthalmology exams for early detection of glaucoma and other disorders of the eye Recommended annual dental exams for proper oral hygiene  Vaccinations: Influenza vaccine: Declined Pneumococcal vaccine: Contraindicated Tdap vaccine: Up to date Shingles vaccine: Please call your insurance company to determine your out of pocket expense for the Shingrix vaccine. You may also receive this vaccine at your local pharmacy or Health Dept.    Advanced directives: We have received a copy of your POA (Power of [place]) and/or Living Will. These documents can be located in your chart.  Goals: Recommend to drink at least 6-8 8oz glasses of water per day.  Next appointment: Please schedule your Annual Wellness Visit with your Nurse Health Advisor in one year.  Preventive Care 71 Years and Older, Female Preventive care refers to lifestyle choices and visits with your health care provider that can promote health and wellness. What does preventive care include?  A yearly physical exam. This is also called an annual well check.  Dental exams once or twice a year.  Routine eye exams. Ask your health care provider how often you should have your eyes checked.  Personal lifestyle choices, including:  Daily care of your teeth and gums.  Regular physical activity.  Eating a healthy diet.  Avoiding tobacco and drug use.  Limiting alcohol use.  Practicing safe sex.  Taking low-dose aspirin every day.  Taking vitamin and mineral supplements  as recommended by your health care provider. What happens during an annual well check? The services and screenings done by your health care provider during your annual well check will depend on your age, overall health, lifestyle risk factors, and family history of disease. Counseling  Your health care provider may ask you questions about your:  Alcohol use.  Tobacco use.  Drug use.  Emotional well-being.  Home and relationship well-being.  Sexual activity.  Eating habits.  History of falls.  Memory and ability to understand (cognition).  Work and work Statistician.  Reproductive health. Screening  You may have the following tests or measurements:  Height, weight, and BMI.  Blood pressure.  Lipid and cholesterol levels. These may be checked every 5 years, or more frequently if you are over 71 years old.  Skin check.  Lung cancer screening. You may have this screening every year starting at age 21 if you have a 30-pack-year history of smoking and currently smoke or have quit within the past 15 years.  Fecal occult blood test (FOBT) of the stool. You may have this test every year starting at age 67.  Flexible sigmoidoscopy or colonoscopy. You may have a sigmoidoscopy every 5 years or a colonoscopy every 10 years starting at age 81.  Hepatitis C blood test.  Hepatitis B blood test.  Sexually transmitted disease (STD) testing.  Diabetes screening. This is done by checking your blood sugar (glucose) after you have not eaten for a while (fasting). You may have this done every 1-3 years.  Bone density scan. This is done to screen for osteoporosis. You may have this done starting at age  65.  Mammogram. This may be done every 1-2 years. Talk to your health care provider about how often you should have regular mammograms. Talk with your health care provider about your test results, treatment options, and if necessary, the need for more tests. Vaccines  Your health care  provider may recommend certain vaccines, such as:  Influenza vaccine. This is recommended every year.  Tetanus, diphtheria, and acellular pertussis (Tdap, Td) vaccine. You may need a Td booster every 10 years.  Zoster vaccine. You may need this after age 50.  Pneumococcal 13-valent conjugate (PCV13) vaccine. One dose is recommended after age 33.  Pneumococcal polysaccharide (PPSV23) vaccine. One dose is recommended after age 71. Talk to your health care provider about which screenings and vaccines you need and how often you need them. This information is not intended to replace advice given to you by your health care provider. Make sure you discuss any questions you have with your health care provider. Document Released: 01/08/2016 Document Revised: 08/31/2016 Document Reviewed: 10/13/2015 Elsevier Interactive Patient Education  2017 [place] Prevention in the Home Falls can cause injuries. They can happen to people of all ages. There are many things you can do to make your home safe and to help prevent falls. What can I do on the outside of my home?  Regularly fix the edges of walkways and driveways and fix any cracks.  Remove anything that might make you trip as you walk through a door, such as a raised step or threshold.  Trim any bushes or trees on the path to your home.  Use bright outdoor lighting.  Clear any walking paths of anything that might make someone trip, such as rocks or tools.  Regularly check to see if handrails are loose or broken. Make sure that both sides of any steps have handrails.  Any raised decks and porches should have guardrails on the edges.  Have any leaves, snow, or ice cleared regularly.  Use sand or salt on walking paths during winter.  Clean up any spills in your garage right away. This includes oil or grease spills. What can I do in the bathroom?  Use night lights.  Install grab bars by the toilet and in the tub and shower. Do  not use towel bars as grab bars.  Use non-skid mats or decals in the tub or shower.  If you need to sit down in the shower, use a plastic, non-slip stool.  Keep the floor dry. Clean up any water that spills on the floor as soon as it happens.  Remove soap buildup in the tub or shower regularly.  Attach bath mats securely with double-sided non-slip rug tape.  Do not have throw rugs and other things on the floor that can make you trip. What can I do in the bedroom?  Use night lights.  Make sure that you have a light by your bed that is easy to reach.  Do not use any sheets or blankets that are too big for your bed. They should not hang down onto the floor.  Have a firm chair that has side arms. You can use this for support while you get dressed.  Do not have throw rugs and other things on the floor that can make you trip. What can I do in the kitchen?  Clean up any spills right away.  Avoid walking on wet floors.  Keep items that you use a lot in easy-to-reach places.  If you need  to reach something above you, use a strong step stool that has a grab bar.  Keep electrical cords out of the way.  Do not use floor polish or wax that makes floors slippery. If you must use wax, use non-skid floor wax.  Do not have throw rugs and other things on the floor that can make you trip. What can I do with my stairs?  Do not leave any items on the stairs.  Make sure that there are handrails on both sides of the stairs and use them. Fix handrails that are broken or loose. Make sure that handrails are as long as the stairways.  Check any carpeting to make sure that it is firmly attached to the stairs. Fix any carpet that is loose or worn.  Avoid having throw rugs at the top or bottom of the stairs. If you do have throw rugs, attach them to the floor with carpet tape.  Make sure that you have a light switch at the top of the stairs and the bottom of the stairs. If you do not have them,  ask someone to add them for you. What else can I do to help prevent falls?  Wear shoes that:  Do not have high heels.  Have rubber bottoms.  Are comfortable and fit you well.  Are closed at the toe. Do not wear sandals.  If you use a stepladder:  Make sure that it is fully opened. Do not climb a closed stepladder.  Make sure that both sides of the stepladder are locked into place.  Ask someone to hold it for you, if possible.  Clearly mark and make sure that you can see:  Any grab bars or handrails.  First and last steps.  Where the edge of each step is.  Use tools that help you move around (mobility aids) if they are needed. These include:  Canes.  Walkers.  Scooters.  Crutches.  Turn on the lights when you go into a dark area. Replace any light bulbs as soon as they burn out.  Set up your furniture so you have a clear path. Avoid moving your furniture around.  If any of your floors are uneven, fix them.  If there are any pets around you, be aware of where they are.  Review your medicines with your doctor. Some medicines can make you feel dizzy. This can increase your chance of falling. Ask your doctor what other things that you can do to help prevent falls. This information is not intended to replace advice given to you by your health care provider. Make sure you discuss any questions you have with your health care provider. Document Released: 10/08/2009 Document Revised: 05/19/2016 Document Reviewed: 01/16/2015 Elsevier Interactive Patient Education  2017 [employer].

## 2018-10-11 ENCOUNTER — Ambulatory Visit
Admission: RE | Admit: 2018-10-11 | Discharge: 2018-10-11 | Disposition: A | Discharge status: Home / Self Care | Payer: Medicare Other | Source: Ambulatory Visit | Attending: Internal Medicine | Admitting: Internal Medicine

## 2018-10-11 DIAGNOSIS — M8589 Other specified disorders of bone density and structure, multiple sites: Secondary | ICD-10-CM | POA: Diagnosis not present

## 2018-10-11 DIAGNOSIS — M810 Age-related osteoporosis without current pathological fracture: Secondary | ICD-10-CM

## 2018-10-11 DIAGNOSIS — Z780 Asymptomatic menopausal state: Secondary | ICD-10-CM | POA: Diagnosis not present

## 2018-10-12 NOTE — Progress Notes (Signed)
Called patient. Patient husband answered the phone. She is not home. Asked him to tell her to call our office to discuss results. He verbalized understanding. Awaiting call. Will try again next business day if not spoke with patient.

## 2018-10-15 NOTE — Progress Notes (Signed)
Spoke with patient. Informed of bone density results- will take calcium and vitd daily. Repeat in two years.

## 2018-10-22 ENCOUNTER — Other Ambulatory Visit: Payer: Self-pay | Admitting: Internal Medicine

## 2018-11-02 DIAGNOSIS — Z872 Personal history of diseases of the skin and subcutaneous tissue: Secondary | ICD-10-CM | POA: Diagnosis not present

## 2018-11-02 DIAGNOSIS — L308 Other specified dermatitis: Secondary | ICD-10-CM | POA: Diagnosis not present

## 2018-11-02 DIAGNOSIS — Z1283 Encounter for screening for malignant neoplasm of skin: Secondary | ICD-10-CM | POA: Diagnosis not present

## 2018-11-02 DIAGNOSIS — L578 Other skin changes due to chronic exposure to nonionizing radiation: Secondary | ICD-10-CM | POA: Diagnosis not present

## 2018-11-13 ENCOUNTER — Ambulatory Visit: Payer: Self-pay | Admitting: Internal Medicine

## 2018-12-24 ENCOUNTER — Encounter: Payer: Self-pay | Admitting: Internal Medicine

## 2018-12-24 ENCOUNTER — Ambulatory Visit: Payer: Medicare Other | Admitting: Internal Medicine

## 2018-12-24 VITALS — BP 132/68 | HR 108 | Ht 66.0 in | Wt 135.0 lb

## 2018-12-24 DIAGNOSIS — M62830 Muscle spasm of back: Secondary | ICD-10-CM

## 2018-12-24 MED ORDER — BACLOFEN 10 MG PO TABS: 10.0000 mg | ORAL_TABLET | Freq: Every day | ORAL | 0 refills | Status: DC

## 2018-12-24 NOTE — Progress Notes (Signed)
Date:  12/24/2018   Name:  Krista Peters   DOB:  1946-05-18   MRN:  [id number]   Chief Complaint: Back Pain (X 2 months. Getting worse. Hurts right in the center of the back/ spine. Does not hurt when walking but constantly hurting while sitting. )  Back Pain  This is a new problem. The current episode started more than 1 month ago. The problem occurs daily. The quality of the pain is described as cramping. The pain does not radiate. Pertinent negatives include no abdominal pain, chest pain, fever or headaches.    Review of Systems  Constitutional: Negative for chills and fever.  Respiratory: Negative for chest tightness and shortness of breath.   Cardiovascular: Negative for chest pain and palpitations.  Gastrointestinal: Negative for abdominal pain, constipation and diarrhea.  Musculoskeletal: Positive for back pain.  Neurological: Negative for dizziness and headaches.    Patient Active Problem List   Diagnosis Date Noted  . Osteoporosis of multiple sites without pathological fracture 10/21/2016  . Urge incontinence of urine 09/11/2015  . Depression 09/11/2015  . Hypertriglyceridemia 09/11/2015  . Irritable bowel syndrome without diarrhea 09/11/2015  . Hx of adenomatous colonic polyps 08/27/2014  . Adhesive capsulitis 03/26/2014    Allergies  Allergen Reactions  . Naproxen Rash  . Penicillins Rash  . Pneumovax 23 [Pneumococcal Vac Polyvalent] Rash    Past Surgical History:  Procedure Laterality Date  . APPENDECTOMY    . COLONOSCOPY  2015   Large adenoma removed laparoscopically  . COLONOSCOPY WITH PROPOFOL N/A 11/06/2017   Procedure: COLONOSCOPY WITH PROPOFOL;  Surgeon: Lucilla Lame, MD;  Location: [facility];  Service: Endoscopy;  Laterality: N/A;  . LAPAROSCOPIC ILEOCECECTOMY  2015   Serrated adenoma  . TONSILLECTOMY      Social History   Tobacco Use  . Smoking status: Former Smoker    Packs/day: 0.25    Years: 10.00    Pack years: 2.50   Types: Cigarettes    Last attempt to quit: 1970    Years since quitting: 50.0  . Smokeless tobacco: Never Used  . Tobacco comment: smoking cessation materials not required  Substance Use Topics  . Alcohol use: Yes    Comment: occasionally  . Drug use: Never     Medication list has been reviewed and updated.  Current Meds  Medication Sig  . buPROPion (WELLBUTRIN XL) 150 MG 24 hr tablet Take 1 tablet (150 mg total) by mouth daily.  . cholecalciferol (VITAMIN D3) 25 MCG (1000 UT) tablet Take 1,000 Units by mouth daily.  . Multiple Vitamins-Minerals (PRESERVISION AREDS) CAPS Take by mouth.  . oxybutynin (DITROPAN) 5 MG tablet TAKE 1 TABLET BY MOUTH EVERY DAY    PHQ 2/9 Scores 12/24/2018 09/03/2018 08/30/2018 08/31/2017  PHQ - 2 Score 0 0 0 2  PHQ- 9 Score 0 0 0 5    Physical Exam Vitals signs and nursing note reviewed.  Constitutional:      General: She is not in acute distress.    Appearance: She is well-developed.  HENT:     Head: Normocephalic and atraumatic.  Neck:     Musculoskeletal: Normal range of motion and neck supple.  Cardiovascular:     Rate and Rhythm: Normal rate and regular rhythm.  Pulmonary:     Effort: Pulmonary effort is normal. No respiratory distress.  Musculoskeletal: Normal range of motion.       Arms:  Skin:    General: Skin is warm and dry.  Findings: No rash.  Neurological:     Mental Status: She is alert and oriented to person, place, and time.     Motor: Motor function is intact.     Deep Tendon Reflexes:     Reflex Scores:      Patellar reflexes are 2+ on the right side and 2+ on the left side.    Comments: SLR negative bilaterally  Psychiatric:        Behavior: Behavior normal.        Thought Content: Thought content normal.     BP 132/68 (BP Location: Right Arm, Patient Position: Sitting, Cuff Size: Normal)   Pulse (!) 108   Ht 5\' 6"  (1.676 m)   Wt 135 lb (61.2 kg)   SpO2 99%   BMI 21.79 kg/m   Assessment and Plan: 1. Spasm  of thoracic back muscle Take baclofen at HS and use heat 1-2 times per day If no improvement, return for re check - baclofen (LIORESAL) 10 MG tablet; Take 1 tablet (10 mg total) by mouth at bedtime.  Dispense: 30 each; Refill: 0   Partially dictated using Editor, commissioning. Any errors are unintentional.  Halina Maidens, MD [facility]  12/24/2018

## 2018-12-26 HISTORY — PX: BIOPSY THYROID: PRO38

## 2019-01-21 ENCOUNTER — Other Ambulatory Visit: Payer: Self-pay | Admitting: Internal Medicine

## 2019-01-21 DIAGNOSIS — M62830 Muscle spasm of back: Secondary | ICD-10-CM

## 2019-02-11 DIAGNOSIS — M2012 Hallux valgus (acquired), left foot: Secondary | ICD-10-CM | POA: Diagnosis not present

## 2019-02-18 ENCOUNTER — Other Ambulatory Visit: Payer: Self-pay

## 2019-02-18 ENCOUNTER — Encounter: Payer: Self-pay | Admitting: *Deleted

## 2019-02-20 DIAGNOSIS — M2042 Other hammer toe(s) (acquired), left foot: Secondary | ICD-10-CM | POA: Diagnosis not present

## 2019-02-20 DIAGNOSIS — M2012 Hallux valgus (acquired), left foot: Secondary | ICD-10-CM | POA: Diagnosis not present

## 2019-02-21 ENCOUNTER — Other Ambulatory Visit: Payer: Self-pay | Admitting: Podiatry

## 2019-02-27 ENCOUNTER — Ambulatory Visit: Payer: Medicare Other | Admitting: Anesthesiology

## 2019-02-27 ENCOUNTER — Ambulatory Visit
Admission: RE | Admit: 2019-02-27 | Discharge: 2019-02-27 | Disposition: A | Discharge status: Home / Self Care | Payer: Medicare Other | Attending: Podiatry | Admitting: Podiatry

## 2019-02-27 ENCOUNTER — Encounter
Admission: RE | Disposition: A | Discharge status: Home / Self Care | Payer: Self-pay | Source: Home / Self Care | Attending: Podiatry

## 2019-02-27 DIAGNOSIS — Z79899 Other long term (current) drug therapy: Secondary | ICD-10-CM | POA: Insufficient documentation

## 2019-02-27 DIAGNOSIS — Z833 Family history of diabetes mellitus: Secondary | ICD-10-CM | POA: Diagnosis not present

## 2019-02-27 DIAGNOSIS — M810 Age-related osteoporosis without current pathological fracture: Secondary | ICD-10-CM | POA: Insufficient documentation

## 2019-02-27 DIAGNOSIS — M24575 Contracture, left foot: Secondary | ICD-10-CM | POA: Diagnosis not present

## 2019-02-27 DIAGNOSIS — Z8249 Family history of ischemic heart disease and other diseases of the circulatory system: Secondary | ICD-10-CM | POA: Diagnosis not present

## 2019-02-27 DIAGNOSIS — M2012 Hallux valgus (acquired), left foot: Secondary | ICD-10-CM | POA: Diagnosis not present

## 2019-02-27 DIAGNOSIS — Z880 Allergy status to penicillin: Secondary | ICD-10-CM | POA: Diagnosis not present

## 2019-02-27 DIAGNOSIS — M2042 Other hammer toe(s) (acquired), left foot: Secondary | ICD-10-CM | POA: Insufficient documentation

## 2019-02-27 DIAGNOSIS — E781 Pure hyperglyceridemia: Secondary | ICD-10-CM | POA: Diagnosis not present

## 2019-02-27 DIAGNOSIS — Z886 Allergy status to analgesic agent status: Secondary | ICD-10-CM | POA: Diagnosis not present

## 2019-02-27 DIAGNOSIS — Z887 Allergy status to serum and vaccine status: Secondary | ICD-10-CM | POA: Insufficient documentation

## 2019-02-27 DIAGNOSIS — Z87891 Personal history of nicotine dependence: Secondary | ICD-10-CM | POA: Diagnosis not present

## 2019-02-27 DIAGNOSIS — K589 Irritable bowel syndrome without diarrhea: Secondary | ICD-10-CM | POA: Diagnosis not present

## 2019-02-27 DIAGNOSIS — Z86010 Personal history of colonic polyps: Secondary | ICD-10-CM | POA: Insufficient documentation

## 2019-02-27 HISTORY — PX: BUNIONECTOMY: SHX129

## 2019-02-27 SURGERY — BUNIONECTOMY
Anesthesia: General | Site: Foot | Laterality: Left

## 2019-02-27 MED ORDER — FENTANYL CITRATE (PF) 100 MCG/2ML IJ SOLN: 25.0000 ug | INTRAMUSCULAR | Status: DC | PRN

## 2019-02-27 MED ORDER — ONDANSETRON HCL 4 MG/2ML IJ SOLN
INTRAMUSCULAR | Status: DC | PRN
  Administered 2019-02-27: 4 mg via INTRAVENOUS

## 2019-02-27 MED ORDER — HYDROCODONE-ACETAMINOPHEN 5-325 MG PO TABS: 1.0000 | ORAL_TABLET | ORAL | 0 refills | Status: DC | PRN

## 2019-02-27 MED ORDER — LIDOCAINE HCL (CARDIAC) PF 100 MG/5ML IV SOSY
PREFILLED_SYRINGE | INTRAVENOUS | Status: DC | PRN
  Administered 2019-02-27: 40 mg via INTRATRACHEAL

## 2019-02-27 MED ORDER — LACTATED RINGERS IV SOLN
INTRAVENOUS | Status: DC
  Administered 2019-02-27: 13:00:00 via INTRAVENOUS

## 2019-02-27 MED ORDER — CLINDAMYCIN PHOSPHATE 900 MG/50ML IV SOLN
900.0000 mg | INTRAVENOUS | Status: AC
  Administered 2019-02-27: 900 mg via INTRAVENOUS

## 2019-02-27 MED ORDER — PROPOFOL 10 MG/ML IV BOLUS
INTRAVENOUS | Status: DC | PRN
  Administered 2019-02-27: 120 mg via INTRAVENOUS

## 2019-02-27 MED ORDER — MIDAZOLAM HCL 5 MG/5ML IJ SOLN
INTRAMUSCULAR | Status: DC | PRN
  Administered 2019-02-27: 2 mg via INTRAVENOUS

## 2019-02-27 MED ORDER — ONDANSETRON HCL 4 MG PO TABS: 4.0000 mg | ORAL_TABLET | Freq: Four times a day (QID) | ORAL | Status: DC | PRN

## 2019-02-27 MED ORDER — BUPIVACAINE LIPOSOME 1.3 % IJ SUSP
INTRAMUSCULAR | Status: DC | PRN
  Administered 2019-02-27 (×2): 10 mL

## 2019-02-27 MED ORDER — OXYCODONE HCL 5 MG/5ML PO SOLN: 5.0000 mg | Freq: Once | ORAL | Status: DC | PRN

## 2019-02-27 MED ORDER — OXYCODONE HCL 5 MG PO TABS: 5.0000 mg | ORAL_TABLET | Freq: Once | ORAL | Status: DC | PRN

## 2019-02-27 MED ORDER — FENTANYL CITRATE (PF) 100 MCG/2ML IJ SOLN
INTRAMUSCULAR | Status: DC | PRN
  Administered 2019-02-27: 25 ug via INTRAVENOUS

## 2019-02-27 MED ORDER — EPHEDRINE SULFATE 50 MG/ML IJ SOLN
INTRAMUSCULAR | Status: DC | PRN
  Administered 2019-02-27: 10 mg via INTRAVENOUS

## 2019-02-27 MED ORDER — BUPIVACAINE HCL (PF) 0.25 % IJ SOLN
INTRAMUSCULAR | Status: DC | PRN
  Administered 2019-02-27: 10 mL

## 2019-02-27 MED ORDER — ONDANSETRON HCL 4 MG/2ML IJ SOLN: 4.0000 mg | Freq: Four times a day (QID) | INTRAMUSCULAR | Status: DC | PRN

## 2019-02-27 MED ORDER — POVIDONE-IODINE 7.5 % EX SOLN
Freq: Once | CUTANEOUS | Status: AC
  Administered 2019-02-27: 14:00:00 via TOPICAL

## 2019-02-27 MED ORDER — DEXAMETHASONE SODIUM PHOSPHATE 4 MG/ML IJ SOLN
INTRAMUSCULAR | Status: DC | PRN
  Administered 2019-02-27: 4 mg via INTRAVENOUS

## 2019-02-27 SURGICAL SUPPLY — 40 items
BANDAGE ELASTIC 4 VELCRO NS (GAUZE/BANDAGES/DRESSINGS) ×2 IMPLANT
BENZOIN TINCTURE PRP APPL 2/3 (GAUZE/BANDAGES/DRESSINGS) ×2 IMPLANT
BLADE MED AGGRESSIVE (BLADE) ×2 IMPLANT
BLADE OSC/SAGITTAL MD 5.5X18 (BLADE) ×2 IMPLANT
BNDG COHESIVE 4X5 TAN STRL (GAUZE/BANDAGES/DRESSINGS) ×2 IMPLANT
BNDG ESMARK 4X12 TAN STRL LF (GAUZE/BANDAGES/DRESSINGS) ×2 IMPLANT
BNDG GAUZE 4.5X4.1 6PLY STRL (MISCELLANEOUS) ×2 IMPLANT
BNDG STRETCH 4X75 STRL LF (GAUZE/BANDAGES/DRESSINGS) ×2 IMPLANT
CANISTER SUCT 1200ML W/VALVE (MISCELLANEOUS) ×2 IMPLANT
CHLORAPREP W/TINT 26 (MISCELLANEOUS) ×2 IMPLANT
COVER LIGHT HANDLE UNIVERSAL (MISCELLANEOUS) ×4 IMPLANT
CUFF TOURN SGL QUICK 18 (TOURNIQUET CUFF) ×2 IMPLANT
DRAPE FLUOR MINI C-ARM 54X84 (DRAPES) ×2 IMPLANT
DYNACLIP Bone Fixation System (Staple) ×2 IMPLANT
ELECT REM PT RETURN 9FT ADLT (ELECTROSURGICAL) ×2
ELECTRODE REM PT RTRN 9FT ADLT (ELECTROSURGICAL) ×1 IMPLANT
GAUZE PETRO XEROFOAM 1X8 (MISCELLANEOUS) ×2 IMPLANT
GAUZE SPONGE 4X4 12PLY STRL (GAUZE/BANDAGES/DRESSINGS) ×2 IMPLANT
GLOVE BIO SURGEON STRL SZ7.5 (GLOVE) ×2 IMPLANT
GLOVE INDICATOR 8.0 STRL GRN (GLOVE) ×2 IMPLANT
GOWN STRL REUS W/ TWL LRG LVL3 (GOWN DISPOSABLE) ×2 IMPLANT
GOWN STRL REUS W/TWL LRG LVL3 (GOWN DISPOSABLE) ×2
K-WIRE DBL TROCAR .045X4 ×2 IMPLANT
K-WIRE DBL TROCAR .062X4 ×2 IMPLANT
KIT PROCEDURE DRILL (DRILL) ×2 IMPLANT
KIT TURNOVER KIT A (KITS) ×2 IMPLANT
KWIRE DBL TROCAR .045X4 ×1 IMPLANT
KWIRE DBL TROCAR .062X4 ×1 IMPLANT
NS IRRIG 500ML POUR BTL (IV SOLUTION) ×2 IMPLANT
PACK EXTREMITY ARMC (MISCELLANEOUS) ×2 IMPLANT
PENCIL SMOKE EVACUATOR (MISCELLANEOUS) ×2 IMPLANT
RASP SM TEAR CROSS CUT (RASP) ×2 IMPLANT
STOCKINETTE IMPERVIOUS LG (DRAPES) ×2 IMPLANT
STRIP CLOSURE SKIN 1/4X4 (GAUZE/BANDAGES/DRESSINGS) ×2 IMPLANT
SUT ETHILON 4 0 FS (SUTURE) ×2 IMPLANT
SUT MNCRL 5-0+ PC-1 (SUTURE) ×1 IMPLANT
SUT MONOCRYL 5-0 (SUTURE) ×1
SUT VIC AB 3-0 SH 27 (SUTURE) ×1
SUT VIC AB 3-0 SH 27X BRD (SUTURE) ×1 IMPLANT
SUT VIC AB 4-0 FS2 27 (SUTURE) ×2 IMPLANT

## 2019-02-27 NOTE — Anesthesia Preprocedure Evaluation (Signed)
Anesthesia Evaluation  Patient identified by MRN, date of birth, ID band Patient awake    Reviewed: Allergy & Precautions, H&P , NPO status , Patient's Chart, lab work & pertinent test results  Airway Mallampati: I  TM Distance: >3 FB Neck ROM: full    Dental no notable dental hx.    Pulmonary former smoker,    Pulmonary exam normal breath sounds clear to auscultation       Cardiovascular negative cardio ROS Normal cardiovascular exam Rhythm:regular Rate:Normal     Neuro/Psych negative neurological ROS     GI/Hepatic negative GI ROS, Neg liver ROS,   Endo/Other  negative endocrine ROS  Renal/GU negative Renal ROS  negative genitourinary   Musculoskeletal   Abdominal   Peds  Hematology negative hematology ROS (+)   Anesthesia Other Findings   Reproductive/Obstetrics negative OB ROS                             Anesthesia Physical Anesthesia Plan  ASA: II  Anesthesia Plan: General LMA   Post-op Pain Management:    Induction:   PONV Risk Score and Plan:   Airway Management Planned:   Additional Equipment:   Intra-op Plan:   Post-operative Plan:   Informed Consent: I have reviewed the patients History and Physical, chart, labs and discussed the procedure including the risks, benefits and alternatives for the proposed anesthesia with the patient or authorized representative who has indicated his/her understanding and acceptance.       Plan Discussed with:   Anesthesia Plan Comments:         Anesthesia Quick Evaluation

## 2019-02-27 NOTE — Anesthesia Procedure Notes (Signed)
Procedure Name: LMA Insertion Date/Time: 02/27/2019 2:52 PM Performed by: Mayme Genta, CRNA Pre-anesthesia Checklist: Patient identified, Emergency Drugs available, Suction available, Timeout performed and Patient being monitored Patient Re-evaluated:Patient Re-evaluated prior to induction Oxygen Delivery Method: Circle system utilized Preoxygenation: Pre-oxygenation with 100% oxygen Induction Type: IV induction LMA: LMA inserted LMA Size: 4.0 Number of attempts: 1 Placement Confirmation: positive ETCO2 and breath sounds checked- equal and bilateral Tube secured with: Tape

## 2019-02-27 NOTE — Op Note (Signed)
Operative note   Surgeon:Lawernce Earll Lawyer: None    Preop diagnosis: 1.  Hallux valgus deformity left foot 2.  Hammertoe contracture left second toe 3.  Contracted metatarsophalangeal joint second toe    Postop diagnosis: Same    Procedure: 1 hallux valgus correction with Liane Comber and Akin osteotomy left foot 2.  Hammertoe repair with PIPJ arthrodesis left second toe 3.  Dorsal medial and lateral capsulotomy metatarsophalangeal joint left second MTPJ    EBL: Minimal    Anesthesia:local and general.  Local consisted of a one-to-one mixture of 0.25% bupivacaine plain and Exparel long-acting anesthetic.  A local block was placed around all areas with 20 cc total preoperatively.  At the end of the case an additional 10 cc of Exparel was used.    Hemostasis: Ankle tourniquet inflated to 200 mmHg for approximately 65 minutes    Specimen: None    Complications: None    Operative indications:Krista Peters is an 73 y.o. that presents today for surgical intervention.  The risks/benefits/alternatives/complications have been discussed and consent has been given.    Procedure:  Patient was brought into the OR and placed on the operating table in thesupine position. After anesthesia was obtained theleft lower extremity was prepped and draped in usual sterile fashion.  Attention was directed to the dorsal medial aspect of the left first MTPJ where a dorsomedial incision was performed.  Sharp and blunt dissection down to the capsule.  A T capsulotomy was performed.  The prominent dorsomedial eminence was noted and transected with a power saw.  Next a V osteotomy was created.  The capital fragment was translocated laterally.  Good realignment was noted on the fluoroscopy.  This was stabilized with a 0.062 K wire.  The ensuing overhanging ledge was then transected and with a power small and smoothed with a power rasp.  Mild residual valgus of the great toe was noted and an Akin osteotomy was  created.  A small wedge osteotomy was created on the base of the proximal phalanx.  This was then stabilized with a compression bone staple.  Good alignment was noted.  The wound was flushed with copious amounts of irrigation.  The capsule was closed with a 3-0 Vicryl, the subcutaneous tissue closed with a 4-0 Vicryl and the skin closed with a 5-0 Monocryl undyed.  Attention was then directed to the second toe where a longitudinal incision was begun over the PIPJ with a curvilinear incision ending just lateral to the metatarsophalangeal joint.  Sharp and blunt dissection carried down to the extensor tendon.  This was then reflected proximally.  This exposed the head of the proximal phalanx and base of the middle phalanx.  The head of the proximal phalanx and base of the middle phalanx were then resected with a power saw.  Mild residual dorsal contracture of the second toe at the MTPJ was noted.  The dorsomedial lateral capsule was released.  The plantar plate was then released with a McGlamry elevator.  Good realignment was noted after release of all of the joint and ligaments.  Next a 0.045 K wire was driven from the base of the middle phalanx through the tip of the toe and retrograded back into the proximal phalanx to the base.  Good realignment was noted in all planes.  The extensor tendon was reapproximated with a 3-0 Vicryl.  The subcutaneous tissue reapproximated with a 3-0 Vicryl and the skin closed with a 4-0 nylon.  At this time 10  cc of Exparel was used to infiltrate all areas.  A mild bulky sterile dressing was applied to the left foot.    Patient tolerated the procedure and anesthesia well.  Was transported from the OR to the PACU with all vital signs stable and vascular status intact. To be discharged per routine protocol.  Will follow up in approximately 1 week in the outpatient clinic.

## 2019-02-27 NOTE — H&P (Signed)
HISTORY AND PHYSICAL INTERVAL NOTE:  02/27/2019  2:21 PM  Krista Peters  has presented today for surgery, with the diagnosis of M20.12 HALLUX VALGUS LEFT FOOT.  The various methods of treatment have been discussed with the patient.  No guarantees were given.  After consideration of risks, benefits and other options for treatment, the patient has consented to surgery.  I have reviewed the patients' chart and labs.     A history and physical examination was performed in my office.  The patient was reexamined.  There have been no changes to this history and physical examination.  Krista Peters A

## 2019-02-27 NOTE — Discharge Instructions (Signed)
[place] [place]   1. Take your medication as prescribed.  Pain medication should be taken only as needed.  2. Keep the dressing clean, dry and intact.  3. Keep your foot elevated above the heart level for the first 48 hours.  4. Walking to the bathroom and brief periods of walking are acceptable, unless we have instructed you to be non-weight bearing.  5. Always wear your post-op shoe when walking.  Always use your crutches if you are to be non-weight bearing.  6. Do not take a shower. Baths are permissible as long as the foot is kept out of the water.   7. Every hour you are awake:  - Bend your knee 15 times. - Flex foot 15 times - Massage calf 15 times  8. Call [facility] [phone]) if any of the following problems occur: - You develop a temperature or fever. - The bandage becomes saturated with blood. - Medication does not stop your pain. - Injury of the foot occurs. - Any symptoms of infection including redness, odor, or red streaks running from wound.  Information for Discharge Teaching: EXPAREL (bupivacaine liposome injectable suspension)   Your surgeon or anesthesiologist gave you EXPAREL(bupivacaine) to help control your pain after surgery.   EXPAREL is a local anesthetic that provides pain relief by numbing the tissue around the surgical site.  EXPAREL is designed to release pain medication over time and can control pain for up to 72 hours.  Depending on how you respond to EXPAREL, you may require less pain medication during your recovery.  Possible side effects:  Temporary loss of sensation or ability to move in the area where bupivacaine was injected.  Nausea, vomiting, constipation  Rarely, numbness and tingling in your mouth or lips, lightheadedness, or anxiety may occur.  Call your doctor right away if you think  you may be experiencing any of these sensations, or if you have other questions regarding possible side effects.  Follow all other discharge instructions given to you by your surgeon or nurse. Eat a healthy diet and drink plenty of water or other fluids.  If you return to the hospital for any reason within 96 hours following the administration of EXPAREL, it is important for health care providers to know that you have received this anesthetic. A teal colored band has been placed on your arm with the date, time and amount of EXPAREL you have received in order to alert and inform your health care providers. Please leave this armband in place for the full 96 hours following administration, and then you may remove the band.  General Anesthesia, Adult, Care After This sheet gives you information about how to care for yourself after your procedure. Your health care provider may also give you more specific instructions. If you have problems or questions, contact your health care provider. What can I expect after the procedure? After the procedure, the following side effects are common:  Pain or discomfort at the IV site.  Nausea.  Vomiting.  Sore throat.  Trouble concentrating.  Feeling cold or chills.  Weak or tired.  Sleepiness and fatigue.  Soreness and body aches. These side effects can affect parts of the body that were not involved in surgery. Follow these instructions at home:  For at least 24 hours after the procedure:  Have a responsible adult stay with you. It is important to have someone  help care for you until you are awake and alert.  Rest as needed.  Do not: ? Participate in activities in which you could fall or become injured. ? Drive. ? Use heavy machinery. ? Drink alcohol. ? Take sleeping pills or medicines that cause drowsiness. ? Make important decisions or sign legal documents. ? Take care of children on your own. Eating and drinking  Follow any instructions  from your health care provider about eating or drinking restrictions.  When you feel hungry, start by eating small amounts of foods that are soft and easy to digest (bland), such as toast. Gradually return to your regular diet.  Drink enough fluid to keep your urine pale yellow.  If you vomit, rehydrate by drinking water, juice, or clear broth. General instructions  If you have sleep apnea, surgery and certain medicines can increase your risk for breathing problems. Follow instructions from your health care provider about wearing your sleep device: ? Anytime you are sleeping, including during daytime naps. ? While taking prescription pain medicines, sleeping medicines, or medicines that make you drowsy.  Return to your normal activities as told by your health care provider. Ask your health care provider what activities are safe for you.  Take over-the-counter and prescription medicines only as told by your health care provider.  If you smoke, do not smoke without supervision.  Keep all follow-up visits as told by your health care provider. This is important. Contact a health care provider if:  You have nausea or vomiting that does not get better with medicine.  You cannot eat or drink without vomiting.  You have pain that does not get better with medicine.  You are unable to pass urine.  You develop a skin rash.  You have a fever.  You have redness around your IV site that gets worse. Get help right away if:  You have difficulty breathing.  You have chest pain.  You have blood in your urine or stool, or you vomit blood. Summary  After the procedure, it is common to have a sore throat or nausea. It is also common to feel tired.  Have a responsible adult stay with you for the first 24 hours after general anesthesia. It is important to have someone help care for you until you are awake and alert.  When you feel hungry, start by eating small amounts of foods that are soft  and easy to digest (bland), such as toast. Gradually return to your regular diet.  Drink enough fluid to keep your urine pale yellow.  Return to your normal activities as told by your health care provider. Ask your health care provider what activities are safe for you. This information is not intended to replace advice given to you by your health care provider. Make sure you discuss any questions you have with your health care provider. Document Released: 03/20/2001 Document Revised: 07/28/2017 Document Reviewed: 07/28/2017 Elsevier Interactive Patient Education  2019 [employer].

## 2019-02-27 NOTE — Anesthesia Postprocedure Evaluation (Signed)
Anesthesia Post Note  Patient: Krista Peters  Procedure(s) Performed: DOUBLE OSTECTOMY (Left Foot)  Patient location during evaluation: PACU Anesthesia Type: General Level of consciousness: awake and alert Pain management: pain level controlled Vital Signs Assessment: post-procedure vital signs reviewed and stable Respiratory status: spontaneous breathing Cardiovascular status: stable Anesthetic complications: no    Riley Papin, III,  Drayven Marchena D

## 2019-02-27 NOTE — Transfer of Care (Signed)
Immediate Anesthesia Transfer of Care Note  Patient: Krista Peters  Procedure(s) Performed: DOUBLE OSTECTOMY (Left Foot)  Patient Location: PACU  Anesthesia Type: General LMA  Level of Consciousness: awake, alert  and patient cooperative  Airway and Oxygen Therapy: Patient Spontanous Breathing and Patient connected to supplemental oxygen  Post-op Assessment: Post-op Vital signs reviewed, Patient's Cardiovascular Status Stable, Respiratory Function Stable, Patent Airway and No signs of Nausea or vomiting  Post-op Vital Signs: Reviewed and stable  Complications: No apparent anesthesia complications

## 2019-02-28 ENCOUNTER — Encounter: Payer: Self-pay | Admitting: Podiatry

## 2019-03-04 DIAGNOSIS — M2012 Hallux valgus (acquired), left foot: Secondary | ICD-10-CM | POA: Diagnosis not present

## 2019-03-06 DIAGNOSIS — H905 Unspecified sensorineural hearing loss: Secondary | ICD-10-CM | POA: Diagnosis not present

## 2019-04-01 DIAGNOSIS — M2012 Hallux valgus (acquired), left foot: Secondary | ICD-10-CM | POA: Diagnosis not present

## 2019-04-04 ENCOUNTER — Other Ambulatory Visit: Payer: Self-pay | Admitting: Internal Medicine

## 2019-05-13 DIAGNOSIS — M2012 Hallux valgus (acquired), left foot: Secondary | ICD-10-CM | POA: Diagnosis not present

## 2019-08-07 ENCOUNTER — Ambulatory Visit (INDEPENDENT_AMBULATORY_CARE_PROVIDER_SITE_OTHER): Payer: Medicare Other | Admitting: Internal Medicine

## 2019-08-07 ENCOUNTER — Other Ambulatory Visit: Payer: Self-pay

## 2019-08-07 ENCOUNTER — Encounter: Payer: Self-pay | Admitting: Internal Medicine

## 2019-08-07 VITALS — BP 112/74 | HR 71 | Ht 66.0 in | Wt 130.0 lb

## 2019-08-07 DIAGNOSIS — E041 Nontoxic single thyroid nodule: Secondary | ICD-10-CM | POA: Diagnosis not present

## 2019-08-07 NOTE — Progress Notes (Signed)
Date:  08/07/2019   Name:  Krista Peters   DOB:  [birth year]   MRN:  [id number]   Chief Complaint: Weight Loss and Mass (Bump on neck- Front of neck. Krista Peters has been there for months. No pain, or growth.)  HPI  Review of Systems  Constitutional: Positive for unexpected weight change (between 5-10 lbs over the past 8 months). Negative for appetite change, fatigue and fever.  HENT: Negative for facial swelling, postnasal drip, sore throat, trouble swallowing and voice change.        Lump on right side of throat - noticed several months ago - no tender and not enlarging  Respiratory: Negative for chest tightness, shortness of breath and wheezing.   Cardiovascular: Positive for palpitations ( occasional PVCs, nothing sustained). Negative for chest pain.  Gastrointestinal: Positive for nausea and vomiting (on several occasions after oatmeal and using almond mild).  Allergic/Immunologic: Negative for environmental allergies.  Neurological: Negative for dizziness and headaches.  Psychiatric/Behavioral: The patient is not nervous/anxious.     Patient Active Problem List   Diagnosis Date Noted  . Osteoporosis of multiple sites without pathological fracture 10/21/2016  . Urge incontinence of urine 09/11/2015  . Depression 09/11/2015  . Hypertriglyceridemia 09/11/2015  . Irritable bowel syndrome without diarrhea 09/11/2015  . Hx of adenomatous colonic polyps 08/27/2014  . Adhesive capsulitis 03/26/2014    Allergies  Allergen Reactions  . Naproxen Rash  . Penicillins Rash  . Pneumovax 23 [Pneumococcal Vac Polyvalent] Rash    Past Surgical History:  Procedure Laterality Date  . APPENDECTOMY    . BUNIONECTOMY Left 02/27/2019   Procedure: DOUBLE OSTECTOMY;  Surgeon: Samara Deist, DPM;  Location: [facility];  Service: Podiatry;  Laterality: Left;  GENERAL WITH LOCAL  . COLONOSCOPY  2015   Large adenoma removed laparoscopically  . COLONOSCOPY WITH PROPOFOL N/A 11/06/2017   Procedure: COLONOSCOPY WITH PROPOFOL;  Surgeon: Lucilla Lame, MD;  Location: [facility];  Service: Endoscopy;  Laterality: N/A;  . LAPAROSCOPIC ILEOCECECTOMY  2015   Serrated adenoma  . TONSILLECTOMY      Social History   Tobacco Use  . Smoking status: Former Smoker    Packs/day: 0.25    Years: 10.00    Pack years: 2.50    Types: Cigarettes    Quit date: 1970    Years since quitting: 50.6  . Smokeless tobacco: Never Used  . Tobacco comment: smoking cessation materials not required  Substance Use Topics  . Alcohol use: Yes    Comment: occasionally  . Drug use: Never     Medication list has been reviewed and updated.  Current Meds  Medication Sig  . buPROPion (WELLBUTRIN XL) 150 MG 24 hr tablet Take 1 tablet (150 mg total) by mouth daily.  . cholecalciferol (VITAMIN D3) 25 MCG (1000 UT) tablet Take 1,000 Units by mouth daily.  . Multiple Vitamins-Minerals (PRESERVISION AREDS) CAPS Take by mouth.  . oxybutynin (DITROPAN) 5 MG tablet TAKE 1 TABLET BY MOUTH EVERY DAY    PHQ 2/9 Scores 08/07/2019 12/24/2018 09/03/2018 08/30/2018  PHQ - 2 Score 0 0 0 0  PHQ- 9 Score 0 0 0 0    BP Readings from Last 3 Encounters:  08/07/19 112/74  02/27/19 (!) 148/79  12/24/18 132/68    Physical Exam Vitals signs and nursing note reviewed.  Constitutional:      General: She is not in acute distress.    Appearance: Normal appearance. She is well-developed.  HENT:  Head: Normocephalic and atraumatic.  Neck:     Musculoskeletal: Normal range of motion and neck supple. No muscular tenderness.     Thyroid: Thyroid mass (1 cm right sided mass, soft and non tender) present.     Vascular: No carotid bruit.  Cardiovascular:     Rate and Rhythm: Normal rate and regular rhythm.     Pulses: Normal pulses.     Heart sounds: No murmur.  Pulmonary:     Effort: Pulmonary effort is normal. No respiratory distress.     Breath sounds: No wheezing or rhonchi.  Musculoskeletal: Normal range  of motion.     Right lower leg: No edema.     Left lower leg: No edema.  Lymphadenopathy:     Cervical: No cervical adenopathy.  Skin:    General: Skin is warm and dry.     Findings: No rash.  Neurological:     Mental Status: She is alert and oriented to person, place, and time.     Cranial Nerves: Cranial nerves are intact.     Sensory: Sensation is intact.     Motor: No tremor.  Psychiatric:        Attention and Perception: Attention normal.        Mood and Affect: Mood normal.        Behavior: Behavior normal.        Thought Content: Thought content normal.     Wt Readings from Last 3 Encounters:  08/07/19 130 lb (59 kg)  02/27/19 128 lb (58.1 kg)  12/24/18 135 lb (61.2 kg)    BP 112/74   Pulse 71   Ht 5\' 6"  (1.676 m)   Wt 130 lb (59 kg)   SpO2 98%   BMI 20.98 kg/m   Assessment and Plan: 1. Thyroid nodule Minimally sx but mild associated weight loss If labs are normal, will get [place]; other wise refer to Endocrinology - Thyroid Panel With TSH   Partially dictated using Dragon software. Any errors are unintentional.  Krista Maidens, MD [facility]  08/07/2019

## 2019-08-09 LAB — THYROID PANEL WITH TSH
Free Thyroxine Index: 2.3 (ref 1.2–4.9)
T3 Uptake Ratio: 28 % (ref 24–39)
T4, Total: 8.3 ug/dL (ref 4.5–12.0)
TSH: 2.09 u[IU]/mL (ref 0.450–4.500)

## 2019-08-10 ENCOUNTER — Other Ambulatory Visit: Payer: Self-pay | Admitting: Internal Medicine

## 2019-08-10 DIAGNOSIS — E041 Nontoxic single thyroid nodule: Secondary | ICD-10-CM

## 2019-08-16 ENCOUNTER — Ambulatory Visit
Admission: RE | Admit: 2019-08-16 | Discharge: 2019-08-16 | Disposition: A | Discharge status: Home / Self Care | Payer: Medicare Other | Source: Ambulatory Visit | Attending: Internal Medicine | Admitting: Internal Medicine

## 2019-08-16 ENCOUNTER — Other Ambulatory Visit: Payer: Self-pay | Admitting: Internal Medicine

## 2019-08-16 ENCOUNTER — Other Ambulatory Visit: Payer: Self-pay

## 2019-08-16 DIAGNOSIS — E041 Nontoxic single thyroid nodule: Secondary | ICD-10-CM

## 2019-08-19 DIAGNOSIS — E041 Nontoxic single thyroid nodule: Secondary | ICD-10-CM | POA: Diagnosis not present

## 2019-08-19 DIAGNOSIS — H903 Sensorineural hearing loss, bilateral: Secondary | ICD-10-CM | POA: Diagnosis not present

## 2019-08-21 ENCOUNTER — Other Ambulatory Visit: Payer: Self-pay | Admitting: Otolaryngology

## 2019-08-21 DIAGNOSIS — E041 Nontoxic single thyroid nodule: Secondary | ICD-10-CM

## 2019-08-27 ENCOUNTER — Ambulatory Visit
Admission: RE | Admit: 2019-08-27 | Discharge: 2019-08-27 | Disposition: A | Discharge status: Home / Self Care | Payer: Medicare Other | Source: Ambulatory Visit | Attending: Otolaryngology | Admitting: Otolaryngology

## 2019-08-27 ENCOUNTER — Other Ambulatory Visit: Payer: Self-pay

## 2019-08-27 DIAGNOSIS — E041 Nontoxic single thyroid nodule: Secondary | ICD-10-CM | POA: Diagnosis not present

## 2019-08-28 LAB — CYTOLOGY - NON PAP

## 2019-09-05 ENCOUNTER — Encounter: Payer: Self-pay | Admitting: Internal Medicine

## 2019-09-09 ENCOUNTER — Ambulatory Visit (INDEPENDENT_AMBULATORY_CARE_PROVIDER_SITE_OTHER): Payer: Medicare Other

## 2019-09-09 VITALS — BP 134/74 | HR 79 | Temp 97.6°F | Ht 66.0 in | Wt 129.0 lb

## 2019-09-09 DIAGNOSIS — Z1231 Encounter for screening mammogram for malignant neoplasm of breast: Secondary | ICD-10-CM | POA: Diagnosis not present

## 2019-09-09 DIAGNOSIS — Z0000 Encounter for general adult medical examination without abnormal findings: Secondary | ICD-10-CM

## 2019-09-09 NOTE — Patient Instructions (Signed)
Krista Peters , Thank you for taking time to come for your Medicare Wellness Visit. I appreciate your ongoing commitment to your health goals. Please review the following plan we discussed and let me know if I can assist you in the future.   Screening recommendations/referrals: Colonoscopy: done 11/06/17. Repeat in 2023. Mammogram: done 08/29/18. Ordered today.  Bone Density: done 10/11/18. Repeat in 2021. Recommended yearly ophthalmology/optometry visit for glaucoma screening and checkup Recommended yearly dental visit for hygiene and checkup  Vaccinations: Influenza vaccine: done 10/30/18 Pneumococcal vaccine: n/a Tdap vaccine: done 2012 Shingles vaccine: Shingrix series completed 08/28/18    Conditions/risks identified: Keep up the great work!  Next appointment: Please follow up in one year for your Medicare Annual Wellness visit.     Preventive Care 35 Years and Older, Female Preventive care refers to lifestyle choices and visits with your health care provider that can promote health and wellness. What does preventive care include?  A yearly physical exam. This is also called an annual well check.  Dental exams once or twice a year.  Routine eye exams. Ask your health care provider how often you should have your eyes checked.  Personal lifestyle choices, including:  Daily care of your teeth and gums.  Regular physical activity.  Eating a healthy diet.  Avoiding tobacco and drug use.  Limiting alcohol use.  Practicing safe sex.  Taking low-dose aspirin every day.  Taking vitamin and mineral supplements as recommended by your health care provider. What happens during an annual well check? The services and screenings done by your health care provider during your annual well check will depend on your age, overall health, lifestyle risk factors, and family history of disease. Counseling  Your health care provider may ask you questions about your:  Alcohol use.   Tobacco use.  Drug use.  Emotional well-being.  Home and relationship well-being.  Sexual activity.  Eating habits.  History of falls.  Memory and ability to understand (cognition).  Work and work Statistician.  Reproductive health. Screening  You may have the following tests or measurements:  Height, weight, and BMI.  Blood pressure.  Lipid and cholesterol levels. These may be checked every 5 years, or more frequently if you are over 38 years old.  Skin check.  Lung cancer screening. You may have this screening every year starting at age 43 if you have a 30-pack-year history of smoking and currently smoke or have quit within the past 15 years.  Fecal occult blood test (FOBT) of the stool. You may have this test every year starting at age 40.  Flexible sigmoidoscopy or colonoscopy. You may have a sigmoidoscopy every 5 years or a colonoscopy every 10 years starting at age 68.  Hepatitis C blood test.  Hepatitis B blood test.  Sexually transmitted disease (STD) testing.  Diabetes screening. This is done by checking your blood sugar (glucose) after you have not eaten for a while (fasting). You may have this done every 1-3 years.  Bone density scan. This is done to screen for osteoporosis. You may have this done starting at age 62.  Mammogram. This may be done every 1-2 years. Talk to your health care provider about how often you should have regular mammograms. Talk with your health care provider about your test results, treatment options, and if necessary, the need for more tests. Vaccines  Your health care provider may recommend certain vaccines, such as:  Influenza vaccine. This is recommended every year.  Tetanus, diphtheria, and acellular  pertussis (Tdap, Td) vaccine. You may need a Td booster every 10 years.  Zoster vaccine. You may need this after age 47.  Pneumococcal 13-valent conjugate (PCV13) vaccine. One dose is recommended after age 56.  Pneumococcal  polysaccharide (PPSV23) vaccine. One dose is recommended after age 68. Talk to your health care provider about which screenings and vaccines you need and how often you need them. This information is not intended to replace advice given to you by your health care provider. Make sure you discuss any questions you have with your health care provider. Document Released: 01/08/2016 Document Revised: 08/31/2016 Document Reviewed: 10/13/2015 Elsevier Interactive Patient Education  2017 [employer] Prevention in the Home Falls can cause injuries. They can happen to people of all ages. There are many things you can do to make your home safe and to help prevent falls. What can I do on the outside of my home?  Regularly fix the edges of walkways and driveways and fix any cracks.  Remove anything that might make you trip as you walk through a door, such as a raised step or threshold.  Trim any bushes or trees on the path to your home.  Use bright outdoor lighting.  Clear any walking paths of anything that might make someone trip, such as rocks or tools.  Regularly check to see if handrails are loose or broken. Make sure that both sides of any steps have handrails.  Any raised decks and porches should have guardrails on the edges.  Have any leaves, snow, or ice cleared regularly.  Use sand or salt on walking paths during winter.  Clean up any spills in your garage right away. This includes oil or grease spills. What can I do in the bathroom?  Use night lights.  Install grab bars by the toilet and in the tub and shower. Do not use towel bars as grab bars.  Use non-skid mats or decals in the tub or shower.  If you need to sit down in the shower, use a plastic, non-slip stool.  Keep the floor dry. Clean up any water that spills on the floor as soon as it happens.  Remove soap buildup in the tub or shower regularly.  Attach bath mats securely with double-sided non-slip rug tape.   Do not have throw rugs and other things on the floor that can make you trip. What can I do in the bedroom?  Use night lights.  Make sure that you have a light by your bed that is easy to reach.  Do not use any sheets or blankets that are too big for your bed. They should not hang down onto the floor.  Have a firm chair that has side arms. You can use this for support while you get dressed.  Do not have throw rugs and other things on the floor that can make you trip. What can I do in the kitchen?  Clean up any spills right away.  Avoid walking on wet floors.  Keep items that you use a lot in easy-to-reach places.  If you need to reach something above you, use a strong step stool that has a grab bar.  Keep electrical cords out of the way.  Do not use floor polish or wax that makes floors slippery. If you must use wax, use non-skid floor wax.  Do not have throw rugs and other things on the floor that can make you trip. What can I do with my stairs?  Do not leave any items on the stairs.  Make sure that there are handrails on both sides of the stairs and use them. Fix handrails that are broken or loose. Make sure that handrails are as long as the stairways.  Check any carpeting to make sure that it is firmly attached to the stairs. Fix any carpet that is loose or worn.  Avoid having throw rugs at the top or bottom of the stairs. If you do have throw rugs, attach them to the floor with carpet tape.  Make sure that you have a light switch at the top of the stairs and the bottom of the stairs. If you do not have them, ask someone to add them for you. What else can I do to help prevent falls?  Wear shoes that:  Do not have high heels.  Have rubber bottoms.  Are comfortable and fit you well.  Are closed at the toe. Do not wear sandals.  If you use a stepladder:  Make sure that it is fully opened. Do not climb a closed stepladder.  Make sure that both sides of the  stepladder are locked into place.  Ask someone to hold it for you, if possible.  Clearly mark and make sure that you can see:  Any grab bars or handrails.  First and last steps.  Where the edge of each step is.  Use tools that help you move around (mobility aids) if they are needed. These include:  Canes.  Walkers.  Scooters.  Crutches.  Turn on the lights when you go into a dark area. Replace any light bulbs as soon as they burn out.  Set up your furniture so you have a clear path. Avoid moving your furniture around.  If any of your floors are uneven, fix them.  If there are any pets around you, be aware of where they are.  Review your medicines with your doctor. Some medicines can make you feel dizzy. This can increase your chance of falling. Ask your doctor what other things that you can do to help prevent falls. This information is not intended to replace advice given to you by your health care provider. Make sure you discuss any questions you have with your health care provider. Document Released: 10/08/2009 Document Revised: 05/19/2016 Document Reviewed: 01/16/2015 Elsevier Interactive Patient Education  2017 [employer].

## 2019-09-09 NOTE — Progress Notes (Signed)
Subjective:   Krista Peters is a 73 y.o. female who presents for Medicare Annual (Subsequent) preventive examination.  Virtual Visit via Telephone Note  I connected with [name] on 09/09/19 at 10:00 AM EDT by telephone and verified that I am speaking with the correct person using two identifiers.  Medicare Annual Wellness visit completed telephonically due to Covid-19 pandemic.   Location: Patient: home Provider: office   I discussed the limitations, risks, security and privacy concerns of performing an evaluation and management service by telephone and the availability of in person appointments. The patient expressed understanding and agreed to proceed.  Some vital signs may be absent or patient reported.   Clemetine Marker, LPN     Review of Systems:   Cardiac Risk Factors include: advanced age (>45men, >3 women)     Objective:     Vitals: BP 134/74   Pulse 79   Temp 97.6 F (36.4 C) (Oral)   Ht 5\' 6"  (1.676 m)   Wt 129 lb (58.5 kg)   BMI 20.82 kg/m   Body mass index is 20.82 kg/m.  Advanced Directives 09/09/2019 02/27/2019 09/03/2018 11/06/2017 08/31/2017 07/07/2016  Does Patient Have a Medical Advance Directive? Yes Yes Yes Yes Yes Yes  Type of Paramedic of [place];Living will [place];Living will [place];Living will Healthcare Power of [place] Living will [place];Living will  Does patient want to make changes to medical advance directive? - No - Patient declined - No - Patient declined - -  Copy of [place] in Chart? Yes - validated most recent copy scanned in chart (See row information) Yes - validated most recent copy scanned in chart (See row information) Yes Yes - -    Tobacco Social History   Tobacco Use  Smoking Status Former Smoker  . Packs/day: 0.25  . Years: 10.00  . Pack years: 2.50  . Types: Cigarettes  . Quit date: 20  . Years since  quitting: 50.7  Smokeless Tobacco Never Used  Tobacco Comment   smoking cessation materials not required     Counseling given: Not Answered Comment: smoking cessation materials not required   Clinical Intake:  Pre-visit preparation completed: Yes  Pain : No/denies pain     BMI - recorded: 20.82 Nutritional Status: BMI of 19-24  Normal Nutritional Risks: None Diabetes: No  How often do you need to have someone help you when you read instructions, pamphlets, or other written materials from your doctor or pharmacy?: 1 - Never  Interpreter Needed?: No  Information entered by :: Clemetine Marker LPN  Past Medical History:  Diagnosis Date  . Hearing aid worn    bilateral   Past Surgical History:  Procedure Laterality Date  . APPENDECTOMY    . BIOPSY THYROID  2020  . BUNIONECTOMY Left 02/27/2019   Procedure: DOUBLE OSTECTOMY;  Surgeon: Samara Deist, DPM;  Location: [facility];  Service: Podiatry;  Laterality: Left;  GENERAL WITH LOCAL  . COLONOSCOPY  2015   Large adenoma removed laparoscopically  . COLONOSCOPY WITH PROPOFOL N/A 11/06/2017   Procedure: COLONOSCOPY WITH PROPOFOL;  Surgeon: Lucilla Lame, MD;  Location: [facility];  Service: Endoscopy;  Laterality: N/A;  . LAPAROSCOPIC ILEOCECECTOMY  2015   Serrated adenoma  . TONSILLECTOMY     Family History  Problem Relation Age of Onset  . Diabetes Mother   . Diabetes Brother   . CAD Father   .  CAD Brother    Social History   Socioeconomic History  . Marital status: Married    Spouse name: Not on file  . Number of children: 4  . Years of education: Not on file  . Highest education level: 12th grade  Occupational History  . Occupation: Retired  Scientific laboratory technician  . Financial resource strain: Not hard at all  . Food insecurity    Worry: Never true    Inability: Never true  . Transportation needs    Medical: No    Non-medical: No  Tobacco Use  . Smoking status: Former Smoker    Packs/day: 0.25     Years: 10.00    Pack years: 2.50    Types: Cigarettes    Quit date: 1970    Years since quitting: 50.7  . Smokeless tobacco: Never Used  . Tobacco comment: smoking cessation materials not required  Substance and Sexual Activity  . Alcohol use: Yes    Comment: occasionally  . Drug use: Never  . Sexual activity: Not Currently  Lifestyle  . Physical activity    Days per week: 7 days    Minutes per session: 30 min  . Stress: Only a little  Relationships  . Social Herbalist on phone: Patient refused    Gets together: Patient refused    Attends religious service: Patient refused    Active member of club or organization: Patient refused    Attends meetings of clubs or organizations: Patient refused    Relationship status: Married  Other Topics Concern  . Not on file  Social History Narrative  . Not on file    Outpatient Encounter Medications as of 09/09/2019  Medication Sig  . buPROPion (WELLBUTRIN XL) 150 MG 24 hr tablet Take 1 tablet (150 mg total) by mouth daily.  . cholecalciferol (VITAMIN D3) 25 MCG (1000 UT) tablet Take 1,000 Units by mouth daily.  Marland Kitchen levothyroxine (SYNTHROID) 25 MCG tablet   . Multiple Vitamins-Minerals (PRESERVISION AREDS) CAPS Take by mouth.  . oxybutynin (DITROPAN) 5 MG tablet TAKE 1 TABLET BY MOUTH EVERY DAY   No facility-administered encounter medications on file as of 09/09/2019.     Activities of Daily Living In your present state of health, do you have any difficulty performing the following activities: 09/09/2019 02/27/2019  Hearing? Y N  Comment wears hearing aids -  Vision? N N  Difficulty concentrating or making decisions? N N  Walking or climbing stairs? N N  Dressing or bathing? N N  Doing errands, shopping? N -  Preparing Food and eating ? N -  Using the Toilet? N -  In the past six months, have you accidently leaked urine? N -  Do you have problems with loss of bowel control? N -  Managing your Medications? N -  Managing  your Finances? N -  Housekeeping or managing your Housekeeping? N -  Some recent data might be hidden    Patient Care Team: [name], MD as PCP - General (Internal Medicine) [facility] as Consulting Physician (Dermatology)    Assessment:   This is a routine wellness examination for Krista Peters.  Exercise Activities and Dietary recommendations Current Exercise Habits: Home exercise routine, Type of exercise: walking, Time (Minutes): 30, Frequency (Times/Week): 7, Weekly Exercise (Minutes/Week): 210, Intensity: Mild, Exercise limited by: None identified  Goals    . DIET - INCREASE WATER INTAKE     Recommend to drink at least 6-8 8oz glasses of water  per day.       Fall Risk Fall Risk  09/09/2019 09/03/2018 08/31/2017 07/07/2016 09/11/2015  Falls in the past year? 0 No No No No  Number falls in past yr: 0 - - - -  Injury with Fall? 0 - - - -  Risk for fall due to : - Impaired vision - - -  Risk for fall due to: Comment - wears eyeglasses - - -  Follow up Falls prevention discussed - - - -   FALL RISK PREVENTION PERTAINING TO THE HOME:  Any stairs in or around the home? No  If so, do they handrails? No   Home free of loose throw rugs in walkways, pet beds, electrical cords, etc? Yes  Adequate lighting in your home to reduce risk of falls? Yes   ASSISTIVE DEVICES UTILIZED TO PREVENT FALLS:  Life alert? No  Use of a cane, walker or w/c? No  Grab bars in the bathroom? Yes  Shower chair or bench in shower? Yes  Elevated toilet seat or a handicapped toilet? Yes   DME ORDERS:  DME order needed?  No   TIMED UP AND GO:  Was the test performed? No . Telephonic visit.   Education: Fall risk prevention has been discussed.  Intervention(s) required? No    Depression Screen PHQ 2/9 Scores 09/09/2019 08/07/2019 12/24/2018 09/03/2018  PHQ - 2 Score 0 0 0 0  PHQ- 9 Score - 0 0 0     Cognitive Function     6CIT Screen 09/09/2019 09/03/2018 08/31/2017  What Year? 0  points 0 points 0 points  What month? 0 points 0 points 0 points  What time? 0 points 0 points 0 points  Count back from 20 0 points 0 points 0 points  Months in reverse 0 points 0 points 0 points  Repeat phrase 0 points 0 points 0 points  Total Score 0 0 0    Immunization History  Administered Date(s) Administered  . Influenza, High Dose Seasonal PF 09/25/2013, 10/30/2018  . Influenza,inj,Quad PF,6+ Mos 09/11/2015, 09/20/2016, 08/31/2017  . Influenza-Unspecified 10/30/2018  . Pneumococcal Polysaccharide-23 12/28/2011, 11/18/2013  . Tdap 12/27/2010  . Zoster 08/02/2012  . Zoster Recombinat (Shingrix) 06/09/2018, 08/28/2018    Qualifies for Shingles Vaccine? Yes  Shingrix series completed.   Tdap: Up to date  Flu Vaccine: Due for Flu vaccine. Does the patient want to receive this vaccine today?  No . Education has been provided regarding the importance of this vaccine but still declined. Advised may receive this vaccine at local pharmacy or Health Dept. Aware to provide a copy of the vaccination record if obtained from local pharmacy or Health Dept. Verbalized acceptance and understanding.  Pneumococcal Vaccine: n/a - pt allergic  Screening Tests Health Maintenance  Topic Date Due  . INFLUENZA VACCINE  07/27/2019  . MAMMOGRAM  08/29/2019  . TETANUS/TDAP  12/27/2020  . COLONOSCOPY  11/06/2022  . DEXA SCAN  Completed  . Hepatitis C Screening  Completed    Cancer Screenings:  Colorectal Screening: Completed 11/06/17. Repeat every 5 years  Mammogram: Completed 08/28/18. Repeat every year;. Ordered today. Pt provided with contact information and advised to call to schedule appt.   Bone Density: Completed 10/11/18. Results reflect  OSTEOPOROSIS. Repeat every 2 years.    Lung Cancer Screening: (Low Dose CT Chest recommended if Age 72-80 years, 30 pack-year currently smoking OR have quit w/in 15years.) does not qualify.    Additional Screening:  Hepatitis C Screening: does  qualify;  Completed 07/07/16  Vision Screening: Recommended annual ophthalmology exams for early detection of glaucoma and other disorders of the eye. Is the patient up to date with their annual eye exam?  No  - postponed due to Covid Who is the provider or what is the name of the office in which the pt attends annual eye exams? [place] Screening: Recommended annual dental exams for proper oral hygiene  Community Resource Referral:  CRR required this visit?  No '    Plan:     I have personally reviewed and addressed the Medicare Annual Wellness questionnaire and have noted the following in the patient's chart:  A. Medical and social history B. Use of alcohol, tobacco or illicit drugs  C. Current medications and supplements D. Functional ability and status E.  Nutritional status F.  Physical activity G. Advance directives H. List of other physicians I.  Hospitalizations, surgeries, and ER visits in previous 12 months J.  [place] such as hearing and vision if needed, cognitive and depression L. Referrals and appointments   In addition, I have reviewed and discussed with patient certain preventive protocols, quality metrics, and best practice recommendations. A written personalized care plan for preventive services as well as general preventive health recommendations were provided to patient.   Signed,  Clemetine Marker, LPN Nurse Health Advisor   Nurse Notes: pt request flu shot, scheduled nurse visit for tomorrow.

## 2019-09-10 ENCOUNTER — Ambulatory Visit (INDEPENDENT_AMBULATORY_CARE_PROVIDER_SITE_OTHER): Payer: Medicare Other

## 2019-09-10 ENCOUNTER — Other Ambulatory Visit: Payer: Self-pay

## 2019-09-10 DIAGNOSIS — Z23 Encounter for immunization: Secondary | ICD-10-CM | POA: Diagnosis not present

## 2019-09-10 NOTE — Progress Notes (Signed)
Pt came in for flu shot- insisted that I give it in her thigh. She stated she talked to "[place] and she told her that she could get a flu shot in her thigh/leg." She refused the arm and was offered to come back when Dr [name] and Crouch are here. She stated she was not going to come back, but would get it in her hip. She has a small frame, therefore I gave it in the R) hip.

## 2019-09-16 ENCOUNTER — Encounter: Payer: Self-pay | Admitting: Internal Medicine

## 2019-09-16 DIAGNOSIS — E041 Nontoxic single thyroid nodule: Secondary | ICD-10-CM | POA: Insufficient documentation

## 2019-09-16 DIAGNOSIS — E042 Nontoxic multinodular goiter: Secondary | ICD-10-CM | POA: Insufficient documentation

## 2019-09-20 ENCOUNTER — Encounter: Payer: Self-pay | Admitting: Internal Medicine

## 2019-10-07 ENCOUNTER — Encounter: Payer: Self-pay | Admitting: Internal Medicine

## 2019-10-07 ENCOUNTER — Ambulatory Visit (INDEPENDENT_AMBULATORY_CARE_PROVIDER_SITE_OTHER): Payer: Medicare Other | Admitting: Internal Medicine

## 2019-10-07 ENCOUNTER — Other Ambulatory Visit: Payer: Self-pay | Admitting: Internal Medicine

## 2019-10-07 ENCOUNTER — Other Ambulatory Visit: Payer: Self-pay

## 2019-10-07 VITALS — BP 132/80 | HR 81 | Ht 66.0 in | Wt 130.0 lb

## 2019-10-07 DIAGNOSIS — E781 Pure hyperglyceridemia: Secondary | ICD-10-CM

## 2019-10-07 DIAGNOSIS — Z0000 Encounter for general adult medical examination without abnormal findings: Secondary | ICD-10-CM | POA: Diagnosis not present

## 2019-10-07 DIAGNOSIS — F325 Major depressive disorder, single episode, in full remission: Secondary | ICD-10-CM

## 2019-10-07 DIAGNOSIS — E042 Nontoxic multinodular goiter: Secondary | ICD-10-CM | POA: Diagnosis not present

## 2019-10-07 DIAGNOSIS — F324 Major depressive disorder, single episode, in partial remission: Secondary | ICD-10-CM

## 2019-10-07 LAB — POCT URINALYSIS DIPSTICK
Bilirubin, UA: 1.02
Blood, UA: NEGATIVE
Glucose, UA: NEGATIVE
Ketones, UA: NEGATIVE
Leukocytes, UA: NEGATIVE
Nitrite, UA: NEGATIVE
Protein, UA: NEGATIVE
Spec Grav, UA: 1.02 (ref 1.010–1.025)
Urobilinogen, UA: 0.2 E.U./dL
pH, UA: 6 (ref 5.0–8.0)

## 2019-10-07 NOTE — Progress Notes (Signed)
Date:  10/07/2019   Name:  Krista Peters   DOB:  1946-02-23   MRN:  [id number]   Chief Complaint: Annual Exam (Breast Exam. No pap. ) Krista Peters is a 73 y.o. female who presents today for her Complete Annual Exam. She feels well. She reports exercising some. She reports she is sleeping well. She denies breast issues.  Mammogram  08/2019 Colonoscopy  10/2017 Immunizations UTD  Depression        This is a chronic problem.  The problem has been resolved since onset.  Associated symptoms include no fatigue and no headaches.  Past treatments include other medications (bupropion).  Compliance with treatment is good.  Previous treatment provided significant relief.  Past medical history includes thyroid problem.   Thyroid Problem Presents for follow-up visit. Patient reports no anxiety, constipation, diarrhea, fatigue, hoarse voice, leg swelling, palpitations, tremors or weight gain. The symptoms have been stable (evaluated with FNA - benign; started on thyroid meds to reduce nodule growth (ENT)).    Review of Systems  Constitutional: Negative for chills, fatigue, fever and weight gain.  HENT: Positive for hearing loss. Negative for congestion, hoarse voice, tinnitus, trouble swallowing and voice change.   Eyes: Negative for visual disturbance.  Respiratory: Negative for cough, chest tightness, shortness of breath and wheezing.   Cardiovascular: Negative for chest pain, palpitations and leg swelling.  Gastrointestinal: Negative for abdominal pain, constipation, diarrhea and vomiting.  Endocrine: Negative for polydipsia and polyuria.  Genitourinary: Negative for dysuria, frequency, genital sores, vaginal bleeding and vaginal discharge.  Musculoskeletal: Negative for arthralgias, gait problem and joint swelling.  Skin: Negative for color change and rash.  Neurological: Negative for dizziness, tremors, light-headedness and headaches.  Hematological: Negative for adenopathy. Does not  bruise/bleed easily.  Psychiatric/Behavioral: Positive for depression. Negative for dysphoric mood and sleep disturbance. The patient is not nervous/anxious.     Patient Active Problem List   Diagnosis Date Noted  . Multiple thyroid nodules 09/16/2019  . Mass of skin of left hand 04/28/2018  . Osteoporosis of multiple sites without pathological fracture 10/21/2016  . Urge incontinence of urine 09/11/2015  . Depression 09/11/2015  . Hypertriglyceridemia 09/11/2015  . Irritable bowel syndrome without diarrhea 09/11/2015  . Hx of adenomatous colonic polyps 08/27/2014  . Adhesive capsulitis 03/26/2014  . Disequilibrium 06/13/2008    Allergies  Allergen Reactions  . Naproxen Rash  . Penicillins Rash  . Pneumovax 23 [Pneumococcal Vac Polyvalent] Rash    Past Surgical History:  Procedure Laterality Date  . APPENDECTOMY    . BIOPSY THYROID  2020  . BUNIONECTOMY Left 02/27/2019   Procedure: DOUBLE OSTECTOMY;  Surgeon: Samara Deist, DPM;  Location: [place];  Service: Podiatry;  Laterality: Left;  GENERAL WITH LOCAL  . COLONOSCOPY  2015   Large adenoma removed laparoscopically  . COLONOSCOPY WITH PROPOFOL N/A 11/06/2017   Procedure: COLONOSCOPY WITH PROPOFOL;  Surgeon: Lucilla Lame, MD;  Location: [place];  Service: Endoscopy;  Laterality: N/A;  . LAPAROSCOPIC ILEOCECECTOMY  2015   Serrated adenoma  . TONSILLECTOMY      Social History   Tobacco Use  . Smoking status: Former Smoker    Packs/day: 0.25    Years: 10.00    Pack years: 2.50    Types: Cigarettes    Quit date: 1970    Years since quitting: 50.8  . Smokeless tobacco: Never Used  . Tobacco comment: smoking cessation materials not required  Substance Use Topics  . Alcohol  use: Yes    Comment: occasionally  . Drug use: Never     Medication list has been reviewed and updated.  Current Meds  Medication Sig  . buPROPion (WELLBUTRIN XL) 150 MG 24 hr tablet TAKE 1 TABLET(150 MG) BY MOUTH  DAILY  . cholecalciferol (VITAMIN D3) 25 MCG (1000 UT) tablet Take 1,000 Units by mouth daily.  Marland Kitchen levothyroxine (SYNTHROID) 25 MCG tablet   . Multiple Vitamins-Minerals (PRESERVISION AREDS) CAPS Take by mouth.  . oxybutynin (DITROPAN) 5 MG tablet TAKE 1 TABLET BY MOUTH EVERY DAY    PHQ 2/9 Scores 10/07/2019 09/09/2019 08/07/2019 12/24/2018  PHQ - 2 Score 0 0 0 0  PHQ- 9 Score - - 0 0    BP Readings from Last 3 Encounters:  10/07/19 132/80  09/09/19 134/74  08/27/19 (!) 154/77    Physical Exam Vitals signs and nursing note reviewed.  Constitutional:      General: She is not in acute distress.    Appearance: She is well-developed.  HENT:     Head: Normocephalic and atraumatic.     Right Ear: Tympanic membrane and ear canal normal.     Left Ear: Tympanic membrane and ear canal normal.     Nose:     Right Sinus: No maxillary sinus tenderness.     Left Sinus: No maxillary sinus tenderness.  Eyes:     General: No scleral icterus.       Right eye: No discharge.        Left eye: No discharge.     Conjunctiva/sclera: Conjunctivae normal.  Neck:     Musculoskeletal: Normal range of motion. No erythema.     Thyroid: No thyromegaly.     Vascular: No carotid bruit.  Cardiovascular:     Rate and Rhythm: Normal rate and regular rhythm.     Pulses: Normal pulses.     Heart sounds: Normal heart sounds.  Pulmonary:     Effort: Pulmonary effort is normal. No respiratory distress.     Breath sounds: No wheezing.  Chest:     Breasts:        Right: No mass, nipple discharge, skin change or tenderness.        Left: No mass, nipple discharge, skin change or tenderness.  Abdominal:     General: Bowel sounds are normal.     Palpations: Abdomen is soft.     Tenderness: There is no abdominal tenderness.  Musculoskeletal: Normal range of motion.     Right lower leg: No edema.     Left lower leg: No edema.  Lymphadenopathy:     Cervical: No cervical adenopathy.  Skin:    General: Skin is  warm and dry.     Capillary Refill: Capillary refill takes less than 2 seconds.     Findings: No rash.  Neurological:     General: No focal deficit present.     Mental Status: She is alert and oriented to person, place, and time.     Cranial Nerves: No cranial nerve deficit.     Sensory: No sensory deficit.     Deep Tendon Reflexes: Reflexes are normal and symmetric.  Psychiatric:        Speech: Speech normal.        Behavior: Behavior normal.        Thought Content: Thought content normal.     Wt Readings from Last 3 Encounters:  10/07/19 130 lb (59 kg)  09/09/19 129 lb (58.5 kg)  08/07/19 130  lb (59 kg)    BP 132/80   Pulse 81   Ht 5\' 6"  (1.676 m)   Wt 130 lb (59 kg)   SpO2 98%   BMI 20.98 kg/m   Assessment and Plan: 1. Annual physical exam Normal exam Continue healthy diet and exercise - CBC with Differential/Platelet - Comprehensive metabolic panel - POCT urinalysis dipstick  2. Major depression, single episode, in complete remission ([place]) Clinically doing very well on current medications Continue without change at this time  3. Hypertriglyceridemia Check labs and advise - Lipid panel  4. Multiple thyroid nodules Seen by ENT - FNA was benign Recommended to repeat US in one year Now on low dose Synthroid to prevent nodule enlargement   Partially dictated using Editor, commissioning. Any errors are unintentional.  Halina Maidens, MD [facility]  10/07/2019

## 2019-10-08 DIAGNOSIS — E781 Pure hyperglyceridemia: Secondary | ICD-10-CM | POA: Diagnosis not present

## 2019-10-08 DIAGNOSIS — Z0000 Encounter for general adult medical examination without abnormal findings: Secondary | ICD-10-CM | POA: Diagnosis not present

## 2019-10-09 LAB — CBC WITH DIFFERENTIAL/PLATELET
Basophils Absolute: 0.1 10*3/uL (ref 0.0–0.2)
Basos: 1 %
EOS (ABSOLUTE): 0.3 10*3/uL (ref 0.0–0.4)
Eos: 5 %
Hematocrit: 40.3 % (ref 34.0–46.6)
Hemoglobin: 13.2 g/dL (ref 11.1–15.9)
Immature Grans (Abs): 0 10*3/uL (ref 0.0–0.1)
Immature Granulocytes: 0 %
Lymphocytes Absolute: 1.9 10*3/uL (ref 0.7–3.1)
Lymphs: 36 %
MCH: 30.7 pg (ref 26.6–33.0)
MCHC: 32.8 g/dL (ref 31.5–35.7)
MCV: 94 fL (ref 79–97)
Monocytes Absolute: 0.5 10*3/uL (ref 0.1–0.9)
Monocytes: 9 %
Neutrophils Absolute: 2.5 10*3/uL (ref 1.4–7.0)
Neutrophils: 49 %
Platelets: 254 10*3/uL (ref 150–450)
RBC: 4.3 x10E6/uL (ref 3.77–5.28)
RDW: 12.1 % (ref 11.7–15.4)
WBC: 5.2 10*3/uL (ref 3.4–10.8)

## 2019-10-09 LAB — COMPREHENSIVE METABOLIC PANEL
ALT: 14 IU/L (ref 0–32)
AST: 16 IU/L (ref 0–40)
Albumin/Globulin Ratio: 2.2 (ref 1.2–2.2)
Albumin: 4.4 g/dL (ref 3.7–4.7)
Alkaline Phosphatase: 81 IU/L (ref 39–117)
BUN/Creatinine Ratio: 14 (ref 12–28)
BUN: 11 mg/dL (ref 8–27)
Bilirubin Total: 0.4 mg/dL (ref 0.0–1.2)
CO2: 27 mmol/L (ref 20–29)
Calcium: 9.2 mg/dL (ref 8.7–10.3)
Chloride: 103 mmol/L (ref 96–106)
Creatinine, Ser: 0.78 mg/dL (ref 0.57–1.00)
GFR calc Af Amer: 88 mL/min/{1.73_m2} (ref >59)
GFR calc non Af Amer: 76 mL/min/{1.73_m2} (ref >59)
Globulin, Total: 2 g/dL (ref 1.5–4.5)
Glucose: 90 mg/dL (ref 65–99)
Potassium: 4.6 mmol/L (ref 3.5–5.2)
Sodium: 142 mmol/L (ref 134–144)
Total Protein: 6.4 g/dL (ref 6.0–8.5)

## 2019-10-09 LAB — LIPID PANEL
Chol/HDL Ratio: 3 ratio (ref 0.0–4.4)
Cholesterol, Total: 186 mg/dL (ref 100–199)
HDL: 61 mg/dL (ref >39)
LDL Chol Calc (NIH): 108 mg/dL — ABNORMAL HIGH (ref 0–99)
Triglycerides: 93 mg/dL (ref 0–149)
VLDL Cholesterol Cal: 17 mg/dL (ref 5–40)

## 2019-11-07 DIAGNOSIS — L578 Other skin changes due to chronic exposure to nonionizing radiation: Secondary | ICD-10-CM | POA: Diagnosis not present

## 2019-11-07 DIAGNOSIS — L821 Other seborrheic keratosis: Secondary | ICD-10-CM | POA: Diagnosis not present

## 2019-11-07 DIAGNOSIS — Z872 Personal history of diseases of the skin and subcutaneous tissue: Secondary | ICD-10-CM | POA: Diagnosis not present

## 2019-11-07 DIAGNOSIS — Z859 Personal history of malignant neoplasm, unspecified: Secondary | ICD-10-CM | POA: Diagnosis not present

## 2019-11-07 DIAGNOSIS — Q828 Other specified congenital malformations of skin: Secondary | ICD-10-CM | POA: Diagnosis not present

## 2019-11-26 DIAGNOSIS — D3131 Benign neoplasm of right choroid: Secondary | ICD-10-CM | POA: Diagnosis not present

## 2019-11-26 DIAGNOSIS — H2513 Age-related nuclear cataract, bilateral: Secondary | ICD-10-CM | POA: Diagnosis not present

## 2019-11-26 DIAGNOSIS — E041 Nontoxic single thyroid nodule: Secondary | ICD-10-CM | POA: Diagnosis not present

## 2019-11-29 ENCOUNTER — Other Ambulatory Visit: Payer: Self-pay | Admitting: Otolaryngology

## 2019-11-29 DIAGNOSIS — E041 Nontoxic single thyroid nodule: Secondary | ICD-10-CM

## 2019-12-05 ENCOUNTER — Other Ambulatory Visit: Payer: Self-pay

## 2019-12-05 DIAGNOSIS — Z20822 Contact with and (suspected) exposure to covid-19: Secondary | ICD-10-CM

## 2019-12-08 LAB — NOVEL CORONAVIRUS, NAA: SARS-CoV-2, NAA: NOT DETECTED

## 2020-03-13 ENCOUNTER — Telehealth: Payer: Self-pay

## 2020-03-13 NOTE — Telephone Encounter (Signed)
Patient informed. Pt will go to UC if symptoms get worse.  CM

## 2020-03-13 NOTE — Telephone Encounter (Signed)
Take tylenol as needed, rest and fluids.  If you have high fever, n/v or severe weakness or headache you may need to be evaluated in UC or ER.

## 2020-03-13 NOTE — Telephone Encounter (Signed)
Patient called saying she received her 2nd Covid Vaccine on 02/27/20. She said she had bad side effects after receiving it- body aches, fever, headache, and red rash.  She said this went away on its own but not 2 weeks later she is experiencing all of the same symptoms again today.   Wants advise on what you recommend to do.  CM

## 2020-03-23 ENCOUNTER — Ambulatory Visit (INDEPENDENT_AMBULATORY_CARE_PROVIDER_SITE_OTHER): Payer: Medicare Other | Admitting: Internal Medicine

## 2020-03-23 ENCOUNTER — Encounter: Payer: Self-pay | Admitting: Internal Medicine

## 2020-03-23 ENCOUNTER — Other Ambulatory Visit: Payer: Self-pay

## 2020-03-23 VITALS — BP 132/78 | HR 77 | Temp 97.7°F | Ht 66.0 in | Wt 132.0 lb

## 2020-03-23 DIAGNOSIS — R1013 Epigastric pain: Secondary | ICD-10-CM

## 2020-03-23 NOTE — Patient Instructions (Signed)
Esomeprazole (nexium) 40 mg daily for 2-4 weeks.

## 2020-03-23 NOTE — Progress Notes (Signed)
Date:  03/23/2020   Name:  Krista Peters   DOB:  1946/03/29   MRN:  [id number]   Chief Complaint: Gastroesophageal Reflux (Waking up with Burning in her throat, and burning in the left side of her back and under breast. Started after last Covid vaccine 02/27/20. )  After first covid vaccine had HA, body aches, fatigue.  Also had a episode of epigastric pain and nausea.  The same thing occurred after the second vaccine.   Last week, 2 weeks after the second dose she had only epigastric discomfort that lasted about 1.5 hours.  She took [place], drank milk then took a Nexium.  It has not recurred since then.  She did eat a hamburger before 2 of the 3 episodes.  Gastroesophageal Reflux She complains of abdominal pain and nausea. She reports no chest pain or no wheezing. This is a recurrent problem. Episode onset: three times. The problem occurs frequently. The problem has been unchanged. Nothing aggravates the symptoms. Pertinent negatives include no melena or weight loss. There are no known risk factors. She has tried an antacid, a PPI and ETOH reduction for the symptoms. Improvement on treatment: unclear if the acute treatment helped or if the pain resolved on its own.    Lab Results  Component Value Date   CREATININE 0.78 10/08/2019   BUN 11 10/08/2019   NA 142 10/08/2019   K 4.6 10/08/2019   CL 103 10/08/2019   CO2 27 10/08/2019   Lab Results  Component Value Date   CHOL 186 10/08/2019   HDL 61 10/08/2019   LDLCALC 108 (H) 10/08/2019   TRIG 93 10/08/2019   CHOLHDL 3.0 10/08/2019   Lab Results  Component Value Date   TSH 2.090 08/07/2019   No results found for: HGBA1C Lab Results  Component Value Date   WBC 5.2 10/08/2019   HGB 13.2 10/08/2019   HCT 40.3 10/08/2019   MCV 94 10/08/2019   PLT 254 10/08/2019   Lab Results  Component Value Date   ALT 14 10/08/2019   AST 16 10/08/2019   ALKPHOS 81 10/08/2019   BILITOT 0.4 10/08/2019     Review of Systems    Constitutional: Negative for chills, fever and weight loss.  Respiratory: Negative for chest tightness, shortness of breath and wheezing.   Cardiovascular: Negative for chest pain, palpitations and leg swelling.  Gastrointestinal: Positive for abdominal pain and nausea. Negative for blood in stool, constipation, diarrhea and melena.  Genitourinary: Negative for dysuria.  Neurological: Negative for dizziness, light-headedness and headaches.  Psychiatric/Behavioral: Negative for sleep disturbance.    Patient Active Problem List   Diagnosis Date Noted  . Multiple thyroid nodules 09/16/2019  . Mass of skin of left hand 04/28/2018  . Osteoporosis of multiple sites without pathological fracture 10/21/2016  . Urge incontinence of urine 09/11/2015  . Major depression, single episode, in complete remission ([place]) 09/11/2015  . Hypertriglyceridemia 09/11/2015  . Irritable bowel syndrome without diarrhea 09/11/2015  . Hx of adenomatous colonic polyps 08/27/2014  . Adhesive capsulitis 03/26/2014  . Disequilibrium 06/13/2008    Allergies  Allergen Reactions  . Naproxen Rash  . Penicillins Rash  . Pneumovax 23 [Pneumococcal Vac Polyvalent] Rash    Past Surgical History:  Procedure Laterality Date  . APPENDECTOMY    . BIOPSY THYROID  2020  . BUNIONECTOMY Left 02/27/2019   Procedure: DOUBLE OSTECTOMY;  Surgeon: Samara Deist, DPM;  Location: [place];  Service: Podiatry;  Laterality: Left;  GENERAL  WITH LOCAL  . COLONOSCOPY  2015   Large adenoma removed laparoscopically  . COLONOSCOPY WITH PROPOFOL N/A 11/06/2017   Procedure: COLONOSCOPY WITH PROPOFOL;  Surgeon: Lucilla Lame, MD;  Location: [place];  Service: Endoscopy;  Laterality: N/A;  . LAPAROSCOPIC ILEOCECECTOMY  2015   Serrated adenoma  . TONSILLECTOMY      Social History   Tobacco Use  . Smoking status: Former Smoker    Packs/day: 0.25    Years: 10.00    Pack years: 2.50    Types: Cigarettes    Quit  date: 1970    Years since quitting: 51.2  . Smokeless tobacco: Never Used  . Tobacco comment: smoking cessation materials not required  Substance Use Topics  . Alcohol use: Yes    Comment: occasionally  . Drug use: Never     Medication list has been reviewed and updated.  Current Meds  Medication Sig  . buPROPion (WELLBUTRIN XL) 150 MG 24 hr tablet TAKE 1 TABLET(150 MG) BY MOUTH DAILY  . cholecalciferol (VITAMIN D3) 25 MCG (1000 UT) tablet Take 1,000 Units by mouth daily.  Marland Kitchen levothyroxine (SYNTHROID) 25 MCG tablet   . oxybutynin (DITROPAN) 5 MG tablet TAKE 1 TABLET BY MOUTH EVERY DAY    PHQ 2/9 Scores 03/23/2020 10/07/2019 09/09/2019 08/07/2019  PHQ - 2 Score 0 0 0 0  PHQ- 9 Score 0 - - 0    BP Readings from Last 3 Encounters:  03/23/20 132/78  10/07/19 132/80  09/09/19 134/74    Physical Exam Vitals and nursing note reviewed.  Constitutional:      General: She is not in acute distress.    Appearance: She is well-developed.  HENT:     Head: Normocephalic and atraumatic.  Cardiovascular:     Rate and Rhythm: Normal rate and regular rhythm.     Pulses: Normal pulses.  Pulmonary:     Effort: Pulmonary effort is normal. No respiratory distress.  Abdominal:     General: Abdomen is flat. Bowel sounds are normal.     Palpations: Abdomen is soft.     Tenderness: There is abdominal tenderness in the epigastric area. There is no right CVA tenderness, left CVA tenderness, guarding or rebound. Negative signs include Murphy's sign.  Musculoskeletal:     Cervical back: Normal range of motion.     Right lower leg: No edema.     Left lower leg: No edema.  Lymphadenopathy:     Cervical: No cervical adenopathy.  Skin:    General: Skin is warm and dry.     Findings: No rash.  Neurological:     Mental Status: She is alert and oriented to person, place, and time.  Psychiatric:        Behavior: Behavior normal.        Thought Content: Thought content normal.     Wt Readings  from Last 3 Encounters:  03/23/20 132 lb (59.9 kg)  10/07/19 130 lb (59 kg)  09/09/19 129 lb (58.5 kg)    BP 132/78   Pulse 77   Temp 97.7 F (36.5 C) (Temporal)   Ht 5\' 6"  (1.676 m)   Wt 132 lb (59.9 kg)   SpO2 98%   BMI 21.31 kg/m   Assessment and Plan: 1. Dyspepsia Suspect this is stress induced from her immune response to the vaccines Will try 2-4 weeks of Nexium 40 mg (she has this at home) If symptoms do not resolve, call for further evaluation   Partially dictated  using Editor, commissioning. Any errors are unintentional.  Halina Maidens, MD [facility]  03/23/2020

## 2020-03-27 ENCOUNTER — Other Ambulatory Visit: Payer: Self-pay | Admitting: Internal Medicine

## 2020-07-29 ENCOUNTER — Ambulatory Visit
Admission: RE | Admit: 2020-07-29 | Discharge: 2020-07-29 | Disposition: A | Discharge status: Home / Self Care | Payer: Medicare Other | Source: Ambulatory Visit | Attending: Otolaryngology | Admitting: Otolaryngology

## 2020-07-29 ENCOUNTER — Other Ambulatory Visit: Payer: Self-pay

## 2020-07-29 DIAGNOSIS — E041 Nontoxic single thyroid nodule: Secondary | ICD-10-CM | POA: Insufficient documentation

## 2020-07-29 DIAGNOSIS — E042 Nontoxic multinodular goiter: Secondary | ICD-10-CM | POA: Diagnosis not present

## 2020-08-19 DIAGNOSIS — H903 Sensorineural hearing loss, bilateral: Secondary | ICD-10-CM | POA: Diagnosis not present

## 2020-08-19 DIAGNOSIS — E041 Nontoxic single thyroid nodule: Secondary | ICD-10-CM | POA: Diagnosis not present

## 2020-08-19 DIAGNOSIS — H6123 Impacted cerumen, bilateral: Secondary | ICD-10-CM | POA: Diagnosis not present

## 2020-09-09 ENCOUNTER — Ambulatory Visit (INDEPENDENT_AMBULATORY_CARE_PROVIDER_SITE_OTHER): Payer: Medicare Other

## 2020-09-09 DIAGNOSIS — Z0000 Encounter for general adult medical examination without abnormal findings: Secondary | ICD-10-CM | POA: Diagnosis not present

## 2020-09-09 NOTE — Patient Instructions (Signed)
Ms. Genna , Thank you for taking time to come for your Medicare Wellness Visit. I appreciate your ongoing commitment to your health goals. Please review the following plan we discussed and let me know if I can assist you in the future.   Screening recommendations/referrals: Colonoscopy: done 11/06/17. Repeat in 2023. Mammogram: done 09/17/19 Bone Density: done 10/11/18 Recommended yearly ophthalmology/optometry visit for glaucoma screening and checkup Recommended yearly dental visit for hygiene and checkup  Vaccinations: Influenza vaccine: due Pneumococcal vaccine: n/a due to allergy Tdap vaccine: done 2012 Shingles vaccine: done 06/09/18 & 08/28/18   Covid-19:done 01/30/20 & 02/27/20  Conditions/risks identified: Keep up the great work!  Next appointment: Follow up in one year for your annual wellness visit    Preventive Care 65 Years and Older, Female Preventive care refers to lifestyle choices and visits with your health care provider that can promote health and wellness. What does preventive care include?  A yearly physical exam. This is also called an annual well check.  Dental exams once or twice a year.  Routine eye exams. Ask your health care provider how often you should have your eyes checked.  Personal lifestyle choices, including:  Daily care of your teeth and gums.  Regular physical activity.  Eating a healthy diet.  Avoiding tobacco and drug use.  Limiting alcohol use.  Practicing safe sex.  Taking low-dose aspirin every day.  Taking vitamin and mineral supplements as recommended by your health care provider. What happens during an annual well check? The services and screenings done by your health care provider during your annual well check will depend on your age, overall health, lifestyle risk factors, and family history of disease. Counseling  Your health care provider may ask you questions about your:  Alcohol use.  Tobacco use.  Drug  use.  Emotional well-being.  Home and relationship well-being.  Sexual activity.  Eating habits.  History of falls.  Memory and ability to understand (cognition).  Work and work Statistician.  Reproductive health. Screening  You may have the following tests or measurements:  Height, weight, and BMI.  Blood pressure.  Lipid and cholesterol levels. These may be checked every 5 years, or more frequently if you are over 89 years old.  Skin check.  Lung cancer screening. You may have this screening every year starting at age 36 if you have a 30-pack-year history of smoking and currently smoke or have quit within the past 15 years.  Fecal occult blood test (FOBT) of the stool. You may have this test every year starting at age 21.  Flexible sigmoidoscopy or colonoscopy. You may have a sigmoidoscopy every 5 years or a colonoscopy every 10 years starting at age 1.  Hepatitis C blood test.  Hepatitis B blood test.  Sexually transmitted disease (STD) testing.  Diabetes screening. This is done by checking your blood sugar (glucose) after you have not eaten for a while (fasting). You may have this done every 1-3 years.  Bone density scan. This is done to screen for osteoporosis. You may have this done starting at age 24.  Mammogram. This may be done every 1-2 years. Talk to your health care provider about how often you should have regular mammograms. Talk with your health care provider about your test results, treatment options, and if necessary, the need for more tests. Vaccines  Your health care provider may recommend certain vaccines, such as:  Influenza vaccine. This is recommended every year.  Tetanus, diphtheria, and acellular pertussis (Tdap, Td)  vaccine. You may need a Td booster every 10 years.  Zoster vaccine. You may need this after age 35.  Pneumococcal 13-valent conjugate (PCV13) vaccine. One dose is recommended after age 32.  Pneumococcal polysaccharide  (PPSV23) vaccine. One dose is recommended after age 60. Talk to your health care provider about which screenings and vaccines you need and how often you need them. This information is not intended to replace advice given to you by your health care provider. Make sure you discuss any questions you have with your health care provider. Document Released: 01/08/2016 Document Revised: 08/31/2016 Document Reviewed: 10/13/2015 Elsevier Interactive Patient Education  2017 [employer] Prevention in the Home Falls can cause injuries. They can happen to people of all ages. There are many things you can do to make your home safe and to help prevent falls. What can I do on the outside of my home?  Regularly fix the edges of walkways and driveways and fix any cracks.  Remove anything that might make you trip as you walk through a door, such as a raised step or threshold.  Trim any bushes or trees on the path to your home.  Use bright outdoor lighting.  Clear any walking paths of anything that might make someone trip, such as rocks or tools.  Regularly check to see if handrails are loose or broken. Make sure that both sides of any steps have handrails.  Any raised decks and porches should have guardrails on the edges.  Have any leaves, snow, or ice cleared regularly.  Use sand or salt on walking paths during winter.  Clean up any spills in your garage right away. This includes oil or grease spills. What can I do in the bathroom?  Use night lights.  Install grab bars by the toilet and in the tub and shower. Do not use towel bars as grab bars.  Use non-skid mats or decals in the tub or shower.  If you need to sit down in the shower, use a plastic, non-slip stool.  Keep the floor dry. Clean up any water that spills on the floor as soon as it happens.  Remove soap buildup in the tub or shower regularly.  Attach bath mats securely with double-sided non-slip rug tape.  Do not have  throw rugs and other things on the floor that can make you trip. What can I do in the bedroom?  Use night lights.  Make sure that you have a light by your bed that is easy to reach.  Do not use any sheets or blankets that are too big for your bed. They should not hang down onto the floor.  Have a firm chair that has side arms. You can use this for support while you get dressed.  Do not have throw rugs and other things on the floor that can make you trip. What can I do in the kitchen?  Clean up any spills right away.  Avoid walking on wet floors.  Keep items that you use a lot in easy-to-reach places.  If you need to reach something above you, use a strong step stool that has a grab bar.  Keep electrical cords out of the way.  Do not use floor polish or wax that makes floors slippery. If you must use wax, use non-skid floor wax.  Do not have throw rugs and other things on the floor that can make you trip. What can I do with my stairs?  Do not leave  any items on the stairs.  Make sure that there are handrails on both sides of the stairs and use them. Fix handrails that are broken or loose. Make sure that handrails are as long as the stairways.  Check any carpeting to make sure that it is firmly attached to the stairs. Fix any carpet that is loose or worn.  Avoid having throw rugs at the top or bottom of the stairs. If you do have throw rugs, attach them to the floor with carpet tape.  Make sure that you have a light switch at the top of the stairs and the bottom of the stairs. If you do not have them, ask someone to add them for you. What else can I do to help prevent falls?  Wear shoes that:  Do not have high heels.  Have rubber bottoms.  Are comfortable and fit you well.  Are closed at the toe. Do not wear sandals.  If you use a stepladder:  Make sure that it is fully opened. Do not climb a closed stepladder.  Make sure that both sides of the stepladder are  locked into place.  Ask someone to hold it for you, if possible.  Clearly mark and make sure that you can see:  Any grab bars or handrails.  First and last steps.  Where the edge of each step is.  Use tools that help you move around (mobility aids) if they are needed. These include:  Canes.  Walkers.  Scooters.  Crutches.  Turn on the lights when you go into a dark area. Replace any light bulbs as soon as they burn out.  Set up your furniture so you have a clear path. Avoid moving your furniture around.  If any of your floors are uneven, fix them.  If there are any pets around you, be aware of where they are.  Review your medicines with your doctor. Some medicines can make you feel dizzy. This can increase your chance of falling. Ask your doctor what other things that you can do to help prevent falls. This information is not intended to replace advice given to you by your health care provider. Make sure you discuss any questions you have with your health care provider. Document Released: 10/08/2009 Document Revised: 05/19/2016 Document Reviewed: 01/16/2015 Elsevier Interactive Patient Education  2017 [employer].

## 2020-09-09 NOTE — Progress Notes (Signed)
Subjective:   MATTISEN POHLMANN is a 74 y.o. female who presents for Medicare Annual (Subsequent) preventive examination.  Virtual Visit via Telephone Note  I connected with  [name] on 09/09/20 at 10:00 AM EDT by telephone and verified that I am speaking with the correct person using two identifiers.  Medicare Annual Wellness visit completed telephonically due to Covid-19 pandemic.   Location: Patient: home Provider: [facility]   I discussed the limitations, risks, security and privacy concerns of performing an evaluation and management service by telephone and the availability of in person appointments. The patient expressed understanding and agreed to proceed.  Unable to perform video visit due to video visit attempted and failed and/or patient does not have video capability.   Some vital signs may be absent or patient reported.   Clemetine Marker, LPN    Review of Systems     Cardiac Risk Factors include: advanced age (>57men, >73 women)     Objective:    There were no vitals filed for this visit. There is no height or weight on file to calculate BMI.  Advanced Directives 09/09/2020 09/09/2019 02/27/2019 09/03/2018 11/06/2017 08/31/2017 07/07/2016  Does Patient Have a Medical Advance Directive? Yes Yes Yes Yes Yes Yes Yes  Type of Paramedic of [place];Living will [place];Living will [place];Living will [place];Living will Healthcare Power of [place] Living will [place];Living will  Does patient want to make changes to medical advance directive? - - No - Patient declined - No - Patient declined - -  Copy of [place] in Chart? Yes - validated most recent copy scanned in chart (See row information) Yes - validated most recent copy scanned in chart (See row information) Yes - validated most recent copy scanned in chart (See row information) Yes Yes - -     Current Medications (verified) Outpatient Encounter Medications as of 09/09/2020  Medication Sig  . buPROPion (WELLBUTRIN XL) 150 MG 24 hr tablet TAKE 1 TABLET(150 MG) BY MOUTH DAILY  . levothyroxine (SYNTHROID) 25 MCG tablet   . oxybutynin (DITROPAN) 5 MG tablet TAKE 1 TABLET BY MOUTH EVERY DAY  . cholecalciferol (VITAMIN D3) 25 MCG (1000 UT) tablet Take 1,000 Units by mouth daily. (Patient not taking: Reported on 09/09/2020)   No facility-administered encounter medications on file as of 09/09/2020.    Allergies (verified) Naproxen, Penicillins, and Pneumovax 23 [pneumococcal vac polyvalent]   History: Past Medical History:  Diagnosis Date  . Depression   . Hearing aid worn    bilateral  . Thyroid disease    Past Surgical History:  Procedure Laterality Date  . APPENDECTOMY    . BIOPSY THYROID  2020  . BUNIONECTOMY Left 02/27/2019   Procedure: DOUBLE OSTECTOMY;  Surgeon: Samara Deist, DPM;  Location: [place];  Service: Podiatry;  Laterality: Left;  GENERAL WITH LOCAL  . COLONOSCOPY  2015   Large adenoma removed laparoscopically  . COLONOSCOPY WITH PROPOFOL N/A 11/06/2017   Procedure: COLONOSCOPY WITH PROPOFOL;  Surgeon: Lucilla Lame, MD;  Location: [place];  Service: Endoscopy;  Laterality: N/A;  . LAPAROSCOPIC ILEOCECECTOMY  2015   Serrated adenoma  . TONSILLECTOMY     Family History  Problem Relation Age of Onset  . Diabetes Mother   . Diabetes Brother   . CAD Father   . CAD Brother    Social History   Socioeconomic History  . Marital status: Married  Spouse name: Not on file  . Number of children: 4  . Years of education: Not on file  . Highest education level: 12th grade  Occupational History  . Occupation: Retired  Tobacco Use  . Smoking status: Former Smoker    Packs/day: 0.25    Years: 10.00    Pack years: 2.50    Types: Cigarettes    Quit date: 1970    Years since quitting: 51.7  . Smokeless tobacco: Never Used  .  Tobacco comment: smoking cessation materials not required  Vaping Use  . Vaping Use: Never used  Substance and Sexual Activity  . Alcohol use: Yes    Comment: occasionally  . Drug use: Never  . Sexual activity: Not Currently  Other Topics Concern  . Not on file  Social History Narrative  . Not on file   Social Determinants of Health   Financial Resource Strain: Low Risk   . Difficulty of Paying Living Expenses: Not hard at all  Food Insecurity: No Food Insecurity  . Worried About Charity fundraiser in the Last Year: Never true  . Ran Out of Food in the Last Year: Never true  Transportation Needs: No Transportation Needs  . Lack of Transportation (Medical): No  . Lack of Transportation (Non-Medical): No  Physical Activity: Sufficiently Active  . Days of Exercise per Week: 7 days  . Minutes of Exercise per Session: 30 min  Stress: No Stress Concern Present  . Feeling of Stress : Not at all  Social Connections: Moderately Isolated  . Frequency of Communication with Friends and Family: More than three times a week  . Frequency of Social Gatherings with Friends and Family: Three times a week  . Attends Religious Services: Never  . Active Member of Clubs or Organizations: No  . Attends Archivist Meetings: Never  . Marital Status: Married    Tobacco Counseling Counseling given: Not Answered Comment: smoking cessation materials not required   Clinical Intake:  Pre-visit preparation completed: Yes  Pain : No/denies pain     Nutritional Risks: None Diabetes: No  How often do you need to have someone help you when you read instructions, pamphlets, or other written materials from your doctor or pharmacy?: 1 - Never    Interpreter Needed?: No  Information entered by :: Clemetine Marker LPN   Activities of Daily Living In your present state of health, do you have any difficulty performing the following activities: 09/09/2020  Hearing? Y  Comment wears hearing  aids  Vision? N  Difficulty concentrating or making decisions? N  Walking or climbing stairs? N  Dressing or bathing? N  Doing errands, shopping? N  Preparing Food and eating ? N  Using the Toilet? N  In the past six months, have you accidently leaked urine? N  Do you have problems with loss of bowel control? N  Managing your Medications? N  Managing your Finances? N  Housekeeping or managing your Housekeeping? N  Some recent data might be hidden    Patient Care Team: [name], MD as PCP - General (Internal Medicine) [facility] as Consulting Physician (Dermatology)  Indicate any recent Medical Services you may have received from other than [facility] providers in the past year (date may be approximate).     Assessment:   This is a routine wellness examination for [place].  Hearing/Vision screen  Hearing Screening   125Hz  250Hz  500Hz  1000Hz  2000Hz  3000Hz  4000Hz  6000Hz  8000Hz   Right ear:  Left ear:           Comments: Pt wears hearing aids maintained by [facility]  Vision Screening Comments: Annual vision screenings done at [place] issues and exercise activities discussed: Current Exercise Habits: Home exercise routine, Type of exercise: walking, Time (Minutes): 30, Frequency (Times/Week): 7, Weekly Exercise (Minutes/Week): 210, Intensity: Moderate, Exercise limited by: None identified  Goals    . DIET - INCREASE WATER INTAKE     Recommend to drink at least 6-8 8oz glasses of water per day.      Depression Screen PHQ 2/9 Scores 09/09/2020 03/23/2020 10/07/2019 09/09/2019 08/07/2019 12/24/2018 09/03/2018  PHQ - 2 Score 0 0 0 0 0 0 0  PHQ- 9 Score - 0 - - 0 0 0    Fall Risk Fall Risk  09/09/2020 03/23/2020 09/09/2019 09/03/2018 08/31/2017  Falls in the past year? 0 0 0 No No  Number falls in past yr: 0 0 0 - -  Injury with Fall? 0 0 0 - -  Risk for fall due to : No Fall Risks No Fall Risks - Impaired vision -  Risk for fall due  to: Comment - - - wears eyeglasses -  Follow up Falls prevention discussed Falls evaluation completed Falls prevention discussed - -    Any stairs in or around the home? No  If so, are there any without handrails? No  Home free of loose throw rugs in walkways, pet beds, electrical cords, etc? Yes  Adequate lighting in your home to reduce risk of falls? Yes   ASSISTIVE DEVICES UTILIZED TO PREVENT FALLS:  Life alert? No  Use of a cane, walker or w/c? No  Grab bars in the bathroom? Yes  Shower chair or bench in shower? Yes  Elevated toilet seat or a handicapped toilet? Yes   TIMED UP AND GO:  Was the test performed? No . Telephonic visit.   Cognitive Function: 6CIT deferred for 2021 AWV; pt states no memory issues     6CIT Screen 09/09/2019 09/03/2018 08/31/2017  What Year? 0 points 0 points 0 points  What month? 0 points 0 points 0 points  What time? 0 points 0 points 0 points  Count back from 20 0 points 0 points 0 points  Months in reverse 0 points 0 points 0 points  Repeat phrase 0 points 0 points 0 points  Total Score 0 0 0    Immunizations Immunization History  Administered Date(s) Administered  . Fluad Quad(high Dose 65+) 09/10/2019  . Influenza, High Dose Seasonal PF 09/25/2013, 10/30/2018  . Influenza,inj,Quad PF,6+ Mos 09/11/2015, 09/20/2016, 08/31/2017  . Influenza-Unspecified 10/30/2018  . Moderna SARS-COVID-2 Vaccination 01/30/2020, 02/27/2020  . Pneumococcal Polysaccharide-23 12/28/2011, 11/18/2013  . Tdap 12/27/2010  . Zoster 08/02/2012  . Zoster Recombinat (Shingrix) 06/09/2018, 08/28/2018    Qualifies for Shingles Vaccine:Yes  Shingrix series completed.   Tdap: Up to date  Flu Vaccine: Due for Flu vaccine. Does the patient want to receive this vaccine today?  No . Education has been provided regarding the importance of this vaccine but still declined. Advised may receive this vaccine at local pharmacy or Health Dept. Aware to provide a copy of the  vaccination record if obtained from local pharmacy or Health Dept. Verbalized acceptance and understanding.  Pneumococcal Vaccine: n/a due to allergy  Covid-19 Vaccine: Up to date  Screening Tests Health Maintenance  Topic Date Due  . INFLUENZA VACCINE  07/26/2020  . MAMMOGRAM  09/16/2020  . TETANUS/TDAP  12/27/2020  . COLONOSCOPY  11/06/2022  . DEXA SCAN  Completed  . COVID-19 Vaccine  Completed  . Hepatitis C Screening  Addressed    Health Maintenance  Health Maintenance Due  Topic Date Due  . INFLUENZA VACCINE  07/26/2020   Cancer Screenings:  Colorectal Screening: Completed 11/06/17. Repeat every 5 years;   Mammogram: Completed 09/17/19. Repeat every year. Ordered by [facility].   Bone Density: Completed 10/11/18. Results reflect  OSTEOPENIA. Repeat every 2 years. Pt declines repeat screening at this time.   Lung Cancer Screening: (Low Dose CT Chest recommended if Age 42-80 years, 30 pack-year currently smoking OR have quit w/in 15years.) does not qualify.   Additional Screening:  Hepatitis C Screening: does qualify; Completed 07/07/16  Vision Screening: Recommended annual ophthalmology exams for early detection of glaucoma and other disorders of the eye. Is the patient up to date with their annual eye exam?  Yes  Who is the provider or what is the name of the office in which the pt attends annual eye exams? [place] Screening: Recommended annual dental exams for proper oral hygiene  Community Resource Referral / Chronic Care Management: CRR required this visit?  No   CCM required this visit?  No      Plan:     I have personally reviewed and noted the following in the patient's chart:   . Medical and social history . Use of alcohol, tobacco or illicit drugs  . Current medications and supplements . Functional ability and status . Nutritional status . Physical activity . Advanced directives . List of other physicians . Hospitalizations,  surgeries, and ER visits in previous 12 months . Vitals . Screenings to include cognitive, depression, and falls . Referrals and appointments  In addition, I have reviewed and discussed with patient certain preventive protocols, quality metrics, and best practice recommendations. A written personalized care plan for preventive services as well as general preventive health recommendations were provided to patient.     Clemetine Marker, LPN   1/61/0960   Nurse Notes: pt doing well and appreciative of visit today.

## 2020-10-15 ENCOUNTER — Other Ambulatory Visit: Payer: Self-pay | Admitting: Internal Medicine

## 2020-10-15 DIAGNOSIS — F324 Major depressive disorder, single episode, in partial remission: Secondary | ICD-10-CM

## 2020-10-15 NOTE — Telephone Encounter (Signed)
Appointment scheduled 10/21/20- RF per protocol

## 2020-10-20 NOTE — Progress Notes (Signed)
Date:  10/21/2020   Name:  Krista Peters   DOB:  1946/07/13   MRN:  [id number]   Chief Complaint: Annual Exam (no breast exam/ no pap) and Rash (X2-3 days, [facility] cold and flu liquid, all over body, redness, not painful or itching )  Krista Peters is a 74 y.o. female who presents today for her Complete Annual Exam. She feels fairly well. She reports exercising walking x5 days a week. She reports she is sleeping well. Breast complaints none.  Mammogram: 08/2020 DEXA: 09/2018 osteopenia of forearm Colonoscopy: 10/2017  Immunization History  Administered Date(s) Administered  . Fluad Quad(high Dose 65+) 09/10/2019  . Influenza, High Dose Seasonal PF 09/25/2013, 10/30/2018  . Influenza,inj,Quad PF,6+ Mos 09/11/2015, 09/20/2016, 08/31/2017  . Influenza-Unspecified 10/30/2018, 10/15/2020  . Moderna SARS-COVID-2 Vaccination 01/30/2020, 02/27/2020  . Pneumococcal Polysaccharide-23 12/28/2011, 11/18/2013  . Tdap 12/27/2010  . Zoster 08/02/2012  . Zoster Recombinat (Shingrix) 06/09/2018, 08/28/2018    Depression        This is a chronic problem.  Associated symptoms include headaches.  Associated symptoms include no fatigue.     The symptoms are aggravated by nothing.  Past treatments include other medications.  Compliance with treatment is good.  Previous treatment provided significant relief. OAB - doing well on Ditropan daily.  No significant control issues; no sx of UTI with dysuria or bleeding. Thyroid nodules - followed by ENT; on low dose supplement.   Lab Results  Component Value Date   CREATININE 0.78 10/08/2019   BUN 11 10/08/2019   NA 142 10/08/2019   K 4.6 10/08/2019   CL 103 10/08/2019   CO2 27 10/08/2019   Lab Results  Component Value Date   CHOL 186 10/08/2019   HDL 61 10/08/2019   LDLCALC 108 (H) 10/08/2019   TRIG 93 10/08/2019   CHOLHDL 3.0 10/08/2019   Lab Results  Component Value Date   TSH 2.090 08/07/2019   No results found for: HGBA1C Lab  Results  Component Value Date   WBC 5.2 10/08/2019   HGB 13.2 10/08/2019   HCT 40.3 10/08/2019   MCV 94 10/08/2019   PLT 254 10/08/2019   Lab Results  Component Value Date   ALT 14 10/08/2019   AST 16 10/08/2019   ALKPHOS 81 10/08/2019   BILITOT 0.4 10/08/2019     Review of Systems  Constitutional: Negative for chills, fatigue and fever.  HENT: Positive for congestion (has head cold ). Negative for hearing loss (wears aids), tinnitus, trouble swallowing and voice change.   Eyes: Negative for visual disturbance.  Respiratory: Negative for cough, chest tightness, shortness of breath and wheezing.   Cardiovascular: Negative for chest pain, palpitations and leg swelling.  Gastrointestinal: Negative for abdominal pain, constipation, diarrhea and vomiting.  Endocrine: Negative for polydipsia and polyuria.  Genitourinary: Negative for dysuria, frequency, genital sores, vaginal bleeding and vaginal discharge.  Musculoskeletal: Negative for arthralgias, gait problem and joint swelling.  Skin: Positive for rash. Negative for color change.  Neurological: Positive for headaches. Negative for dizziness, tremors and light-headedness.  Hematological: Negative for adenopathy. Does not bruise/bleed easily.  Psychiatric/Behavioral: Positive for depression. Negative for dysphoric mood and sleep disturbance. The patient is not nervous/anxious.     Patient Active Problem List   Diagnosis Date Noted  . Nontoxic single thyroid nodule 09/16/2019  . Mass of skin of left hand 04/28/2018  . Osteoporosis of multiple sites without pathological fracture 10/21/2016  . Urge incontinence of urine 09/11/2015  .  Major depression, single episode, in complete remission ([place]) 09/11/2015  . Hypertriglyceridemia 09/11/2015  . Irritable bowel syndrome without diarrhea 09/11/2015  . Hx of adenomatous colonic polyps 08/27/2014  . Adhesive capsulitis 03/26/2014  . Disequilibrium 06/13/2008    Allergies  Allergen  Reactions  . Naproxen Rash  . Penicillins Rash  . Pneumovax 23 [Pneumococcal Vac Polyvalent] Rash    Past Surgical History:  Procedure Laterality Date  . APPENDECTOMY    . BIOPSY THYROID  2020  . BUNIONECTOMY Left 02/27/2019   Procedure: DOUBLE OSTECTOMY;  Surgeon: Samara Deist, DPM;  Location: [place];  Service: Podiatry;  Laterality: Left;  GENERAL WITH LOCAL  . COLONOSCOPY  2015   Large adenoma removed laparoscopically  . COLONOSCOPY WITH PROPOFOL N/A 11/06/2017   Procedure: COLONOSCOPY WITH PROPOFOL;  Surgeon: Lucilla Lame, MD;  Location: [place];  Service: Endoscopy;  Laterality: N/A;  . LAPAROSCOPIC ILEOCECECTOMY  2015   Serrated adenoma  . TONSILLECTOMY      Social History   Tobacco Use  . Smoking status: Former Smoker    Packs/day: 0.25    Years: 10.00    Pack years: 2.50    Types: Cigarettes    Quit date: 1970    Years since quitting: 51.8  . Smokeless tobacco: Never Used  . Tobacco comment: smoking cessation materials not required  Vaping Use  . Vaping Use: Never used  Substance Use Topics  . Alcohol use: Yes    Comment: occasionally  . Drug use: Never     Medication list has been reviewed and updated.  Current Meds  Medication Sig  . buPROPion (WELLBUTRIN XL) 150 MG 24 hr tablet TAKE 1 TABLET(150 MG) BY MOUTH DAILY  . levothyroxine (SYNTHROID) 25 MCG tablet   . oxybutynin (DITROPAN) 5 MG tablet TAKE 1 TABLET BY MOUTH EVERY DAY    PHQ 2/9 Scores 10/21/2020 09/09/2020 03/23/2020 10/07/2019  PHQ - 2 Score 1 0 0 0  PHQ- 9 Score 1 - 0 -    GAD 7 : Generalized Anxiety Score 10/21/2020  Nervous, Anxious, on Edge 1  Control/stop worrying 0  Worry too much - different things 0  Trouble relaxing 1  Restless 0  Easily annoyed or irritable 0  Afraid - awful might happen 0  Total GAD 7 Score 2  Anxiety Difficulty Not difficult at all    BP Readings from Last 3 Encounters:  10/21/20 134/84  03/23/20 132/78  10/07/19 132/80     Physical Exam Vitals and nursing note reviewed.  Constitutional:      General: She is not in acute distress.    Appearance: She is well-developed.  HENT:     Head: Normocephalic and atraumatic.     Right Ear: Tympanic membrane and ear canal normal. Decreased hearing noted.     Left Ear: Tympanic membrane and ear canal normal. Decreased hearing noted.     Nose:     Right Sinus: No maxillary sinus tenderness.     Left Sinus: No maxillary sinus tenderness.  Eyes:     General: No scleral icterus.       Right eye: No discharge.        Left eye: No discharge.     Conjunctiva/sclera: Conjunctivae normal.  Neck:     Thyroid: No thyromegaly.     Vascular: No carotid bruit.  Cardiovascular:     Rate and Rhythm: Normal rate and regular rhythm.     Pulses: Normal pulses.     Heart  sounds: Normal heart sounds.  Pulmonary:     Effort: Pulmonary effort is normal. No respiratory distress.     Breath sounds: No wheezing.  Abdominal:     General: Bowel sounds are normal.     Palpations: Abdomen is soft.     Tenderness: There is no abdominal tenderness.  Musculoskeletal:        General: Normal range of motion.     Cervical back: Normal range of motion. No erythema.     Right lower leg: No edema.     Left lower leg: No edema.  Lymphadenopathy:     Cervical: No cervical adenopathy.  Skin:    General: Skin is warm and dry.     Capillary Refill: Capillary refill takes less than 2 seconds.     Findings: Rash (fine macular red rash over entire body) present.  Neurological:     General: No focal deficit present.     Mental Status: She is alert and oriented to person, place, and time.     Cranial Nerves: No cranial nerve deficit.     Sensory: No sensory deficit.     Deep Tendon Reflexes: Reflexes are normal and symmetric.  Psychiatric:        Attention and Perception: Attention normal.        Mood and Affect: Mood normal.        Behavior: Behavior normal.     Wt Readings from Last 3  Encounters:  10/21/20 138 lb (62.6 kg)  03/23/20 132 lb (59.9 kg)  10/07/19 130 lb (59 kg)    BP 134/84 (BP Location: Right Arm, Patient Position: Sitting)   Pulse 90   Temp 98.1 F (36.7 C) (Oral)   Ht 5\' 6"  (1.676 m)   Wt 138 lb (62.6 kg)   SpO2 96%   BMI 22.27 kg/m   Assessment and Plan: 1. Annual physical exam Normal exam Continue healthy diet and exercise Unable to tolerate calcium or vitamin D supplements - continue dairy in diet and modest sun exposure - CBC with Differential/Platelet - Comprehensive metabolic panel - Lipid panel - POCT urinalysis dipstick  2. Major depression, single episode, in complete remission ([place]) Clinically stable on current regimen with good control of symptoms, No SI or HI. Will continue current therapy.  3. Urge incontinence of urine Sx well controlled with Ditropan No s/s of UTI  4. Nontoxic single thyroid nodule Followed by ENT On low dose levothyroxine to suppress nodule growth - TSH + free T4  5. Drug eruption Due to cold medication containing dextramethorphan and phenylephrine Avoid all cold medication in the future - for sx take tylenol and use flonase spary   Partially dictated using Editor, commissioning. Any errors are unintentional.  Halina Maidens, MD [facility]  10/21/2020

## 2020-10-21 ENCOUNTER — Other Ambulatory Visit: Payer: Self-pay

## 2020-10-21 ENCOUNTER — Ambulatory Visit (INDEPENDENT_AMBULATORY_CARE_PROVIDER_SITE_OTHER): Payer: Medicare Other | Admitting: Internal Medicine

## 2020-10-21 ENCOUNTER — Encounter: Payer: Self-pay | Admitting: Internal Medicine

## 2020-10-21 VITALS — BP 134/84 | HR 90 | Temp 98.1°F | Ht 66.0 in | Wt 138.0 lb

## 2020-10-21 DIAGNOSIS — Z0000 Encounter for general adult medical examination without abnormal findings: Secondary | ICD-10-CM | POA: Diagnosis not present

## 2020-10-21 DIAGNOSIS — L270 Generalized skin eruption due to drugs and medicaments taken internally: Secondary | ICD-10-CM | POA: Diagnosis not present

## 2020-10-21 DIAGNOSIS — F325 Major depressive disorder, single episode, in full remission: Secondary | ICD-10-CM

## 2020-10-21 DIAGNOSIS — N3941 Urge incontinence: Secondary | ICD-10-CM | POA: Diagnosis not present

## 2020-10-21 DIAGNOSIS — E782 Mixed hyperlipidemia: Secondary | ICD-10-CM | POA: Diagnosis not present

## 2020-10-21 DIAGNOSIS — E041 Nontoxic single thyroid nodule: Secondary | ICD-10-CM | POA: Diagnosis not present

## 2020-10-21 LAB — POCT URINALYSIS DIPSTICK
Bilirubin, UA: NEGATIVE
Blood, UA: NEGATIVE
Glucose, UA: NEGATIVE
Ketones, UA: NEGATIVE
Nitrite, UA: NEGATIVE
Protein, UA: NEGATIVE
Spec Grav, UA: 1.01 (ref 1.010–1.025)
Urobilinogen, UA: 0.2 E.U./dL
pH, UA: 5 (ref 5.0–8.0)

## 2020-10-22 ENCOUNTER — Encounter: Payer: Self-pay | Admitting: Internal Medicine

## 2020-10-22 LAB — COMPREHENSIVE METABOLIC PANEL
ALT: 11 IU/L (ref 0–32)
AST: 14 IU/L (ref 0–40)
Albumin/Globulin Ratio: 1.7 (ref 1.2–2.2)
Albumin: 4.3 g/dL (ref 3.7–4.7)
Alkaline Phosphatase: 87 IU/L (ref 44–121)
BUN/Creatinine Ratio: 11 — ABNORMAL LOW (ref 12–28)
BUN: 8 mg/dL (ref 8–27)
Bilirubin Total: 0.3 mg/dL (ref 0.0–1.2)
CO2: 25 mmol/L (ref 20–29)
Calcium: 9 mg/dL (ref 8.7–10.3)
Chloride: 99 mmol/L (ref 96–106)
Creatinine, Ser: 0.76 mg/dL (ref 0.57–1.00)
GFR calc Af Amer: 90 mL/min/{1.73_m2} (ref >59)
GFR calc non Af Amer: 78 mL/min/{1.73_m2} (ref >59)
Globulin, Total: 2.5 g/dL (ref 1.5–4.5)
Glucose: 99 mg/dL (ref 65–99)
Potassium: 4.6 mmol/L (ref 3.5–5.2)
Sodium: 138 mmol/L (ref 134–144)
Total Protein: 6.8 g/dL (ref 6.0–8.5)

## 2020-10-22 LAB — CBC WITH DIFFERENTIAL/PLATELET
Basophils Absolute: 0 10*3/uL (ref 0.0–0.2)
Basos: 1 %
EOS (ABSOLUTE): 0.2 10*3/uL (ref 0.0–0.4)
Eos: 4 %
Hematocrit: 42.4 % (ref 34.0–46.6)
Hemoglobin: 13.9 g/dL (ref 11.1–15.9)
Immature Grans (Abs): 0 10*3/uL (ref 0.0–0.1)
Immature Granulocytes: 0 %
Lymphocytes Absolute: 1.1 10*3/uL (ref 0.7–3.1)
Lymphs: 21 %
MCH: 30.8 pg (ref 26.6–33.0)
MCHC: 32.8 g/dL (ref 31.5–35.7)
MCV: 94 fL (ref 79–97)
Monocytes Absolute: 0.5 10*3/uL (ref 0.1–0.9)
Monocytes: 10 %
Neutrophils Absolute: 3.2 10*3/uL (ref 1.4–7.0)
Neutrophils: 64 %
Platelets: 245 10*3/uL (ref 150–450)
RBC: 4.51 x10E6/uL (ref 3.77–5.28)
RDW: 11.8 % (ref 11.7–15.4)
WBC: 5.1 10*3/uL (ref 3.4–10.8)

## 2020-10-22 LAB — TSH+FREE T4
Free T4: 1.22 ng/dL (ref 0.82–1.77)
TSH: 1.87 u[IU]/mL (ref 0.450–4.500)

## 2020-10-22 LAB — LIPID PANEL
Chol/HDL Ratio: 3.4 ratio (ref 0.0–4.4)
Cholesterol, Total: 218 mg/dL — ABNORMAL HIGH (ref 100–199)
HDL: 65 mg/dL (ref >39)
LDL Chol Calc (NIH): 134 mg/dL — ABNORMAL HIGH (ref 0–99)
Triglycerides: 105 mg/dL (ref 0–149)
VLDL Cholesterol Cal: 19 mg/dL (ref 5–40)

## 2020-10-26 ENCOUNTER — Other Ambulatory Visit: Payer: Self-pay | Admitting: Internal Medicine

## 2020-10-26 DIAGNOSIS — E782 Mixed hyperlipidemia: Secondary | ICD-10-CM

## 2020-10-26 MED ORDER — ATORVASTATIN CALCIUM 10 MG PO TABS: 10.0000 mg | ORAL_TABLET | Freq: Every day | ORAL | 1 refills | Status: DC

## 2020-10-26 NOTE — Progress Notes (Signed)
Please Advise. Pt will take statin medication.  KP

## 2020-11-09 DIAGNOSIS — B353 Tinea pedis: Secondary | ICD-10-CM | POA: Diagnosis not present

## 2020-11-09 DIAGNOSIS — L578 Other skin changes due to chronic exposure to nonionizing radiation: Secondary | ICD-10-CM | POA: Diagnosis not present

## 2020-11-09 DIAGNOSIS — L821 Other seborrheic keratosis: Secondary | ICD-10-CM | POA: Diagnosis not present

## 2020-11-09 DIAGNOSIS — Z872 Personal history of diseases of the skin and subcutaneous tissue: Secondary | ICD-10-CM | POA: Diagnosis not present

## 2020-11-09 DIAGNOSIS — Z859 Personal history of malignant neoplasm, unspecified: Secondary | ICD-10-CM | POA: Diagnosis not present

## 2020-11-25 DIAGNOSIS — H353132 Nonexudative age-related macular degeneration, bilateral, intermediate dry stage: Secondary | ICD-10-CM | POA: Diagnosis not present

## 2021-01-14 ENCOUNTER — Other Ambulatory Visit: Payer: Self-pay | Admitting: Internal Medicine

## 2021-01-14 DIAGNOSIS — F324 Major depressive disorder, single episode, in partial remission: Secondary | ICD-10-CM

## 2021-03-22 ENCOUNTER — Other Ambulatory Visit: Payer: Self-pay | Admitting: Internal Medicine

## 2021-03-23 ENCOUNTER — Ambulatory Visit: Payer: Self-pay | Admitting: Internal Medicine

## 2021-04-08 DIAGNOSIS — L821 Other seborrheic keratosis: Secondary | ICD-10-CM | POA: Diagnosis not present

## 2021-04-08 DIAGNOSIS — L570 Actinic keratosis: Secondary | ICD-10-CM | POA: Diagnosis not present

## 2021-04-12 ENCOUNTER — Other Ambulatory Visit: Payer: Self-pay | Admitting: Internal Medicine

## 2021-04-12 DIAGNOSIS — F324 Major depressive disorder, single episode, in partial remission: Secondary | ICD-10-CM

## 2021-07-16 IMAGING — US US FNA BIOPSY THYROID 1ST LESION
1 series · 11 of 11 positions shown · non-contrast
Comparison: none

INDICATION: Right lobe thyroid nodule.

[Series 1: us fna biopsy thyroid 1st lesion · 11 acquisitions, 11 frames shown]
[im 1/11]
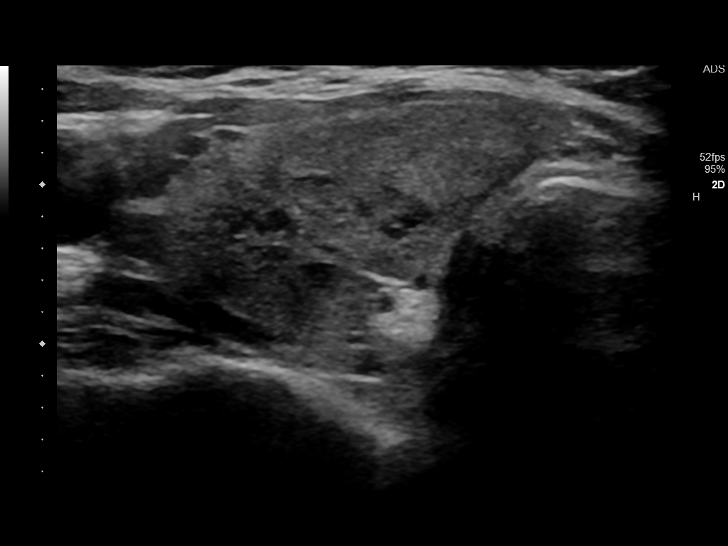
[im 2/11]
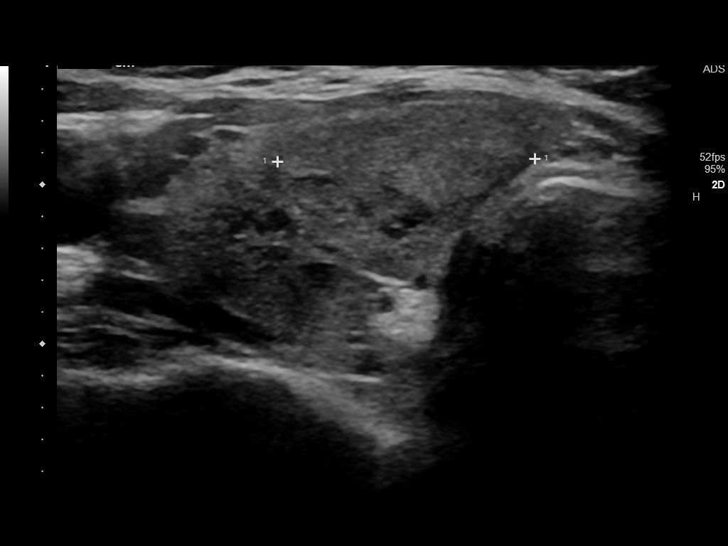
[im 3/11]
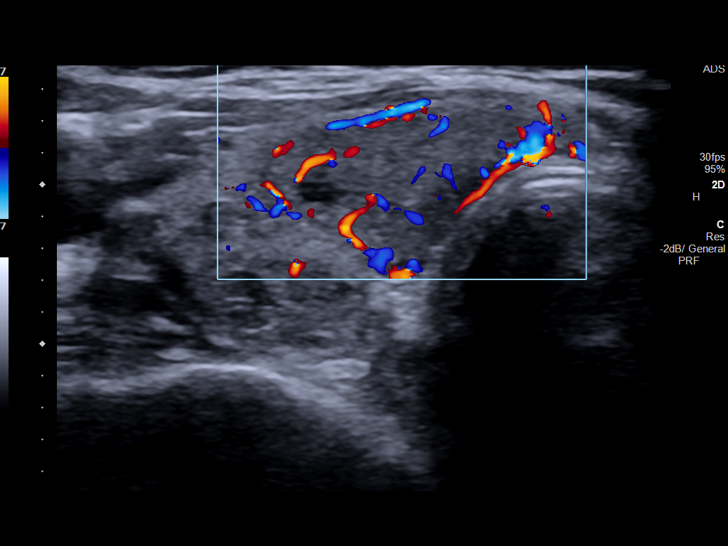
[im 4/11]
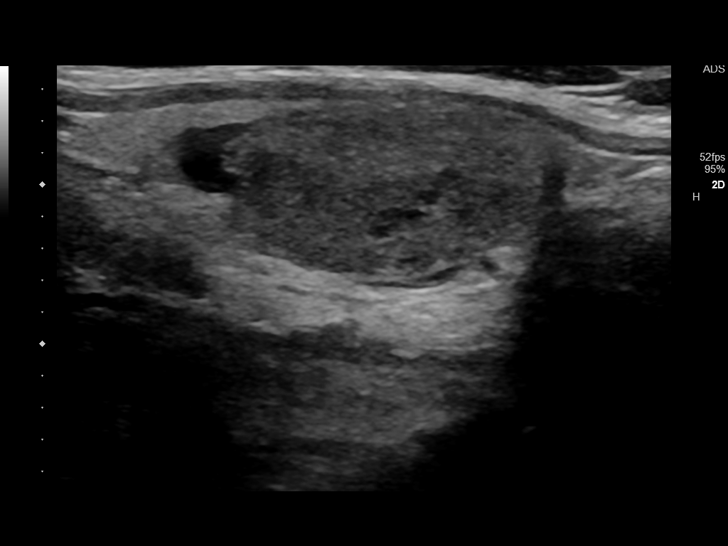
[im 5/11]
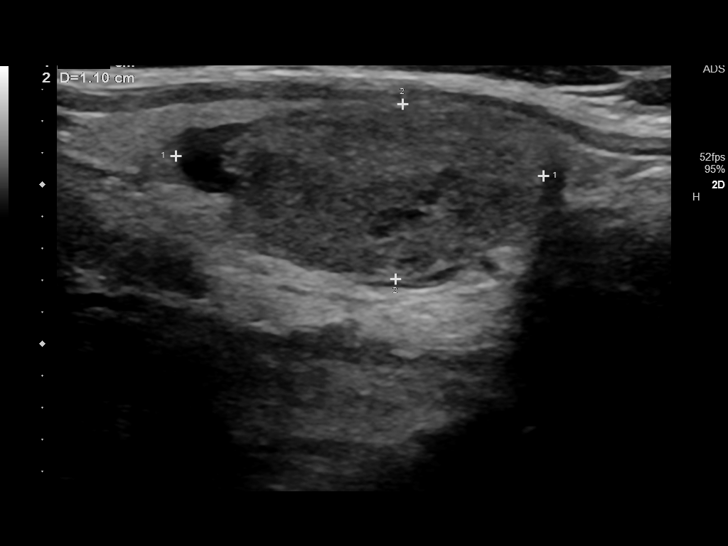
[im 6/11]
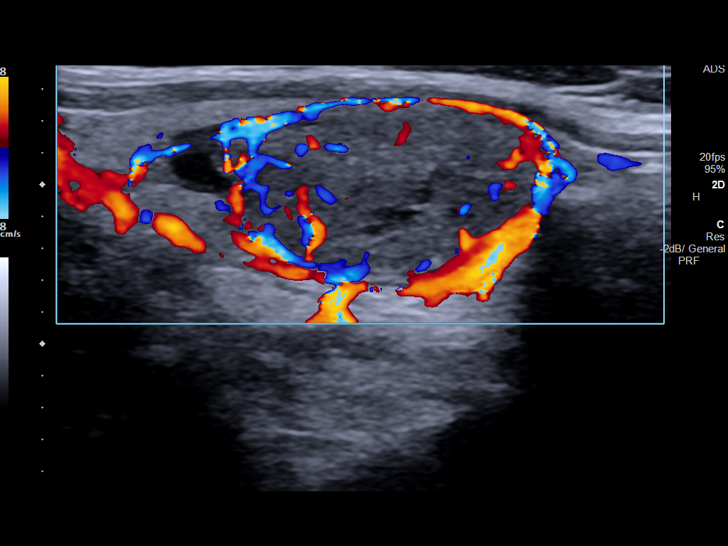
[im 7/11]
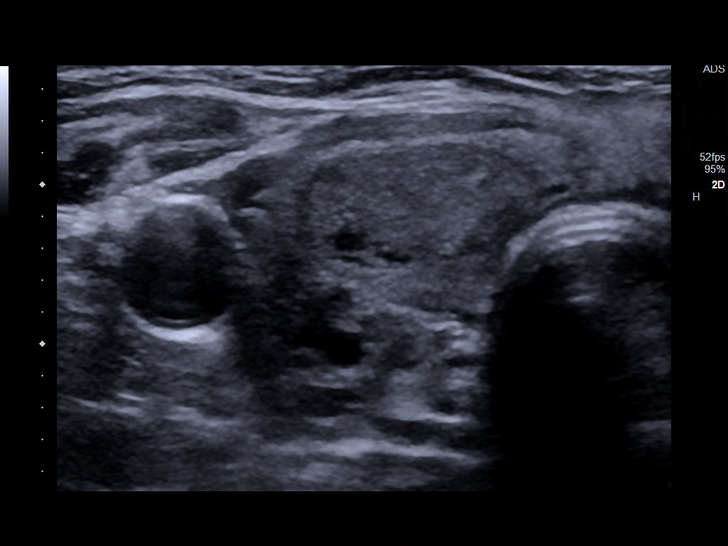
[im 8/11]
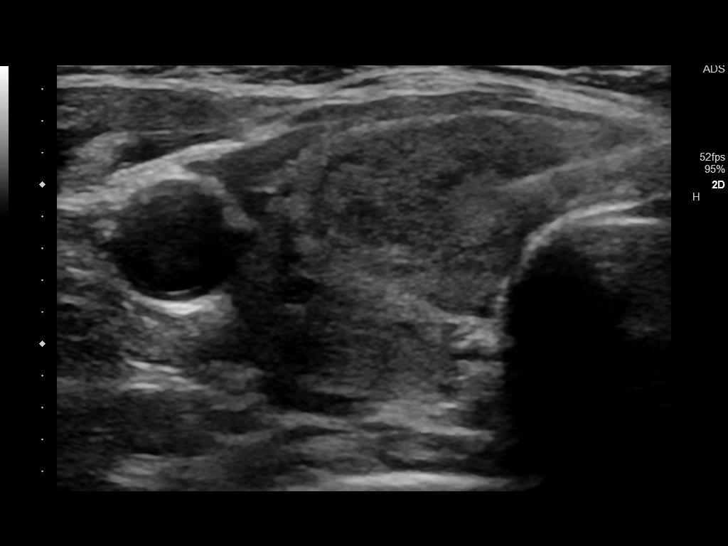
[im 9/11]
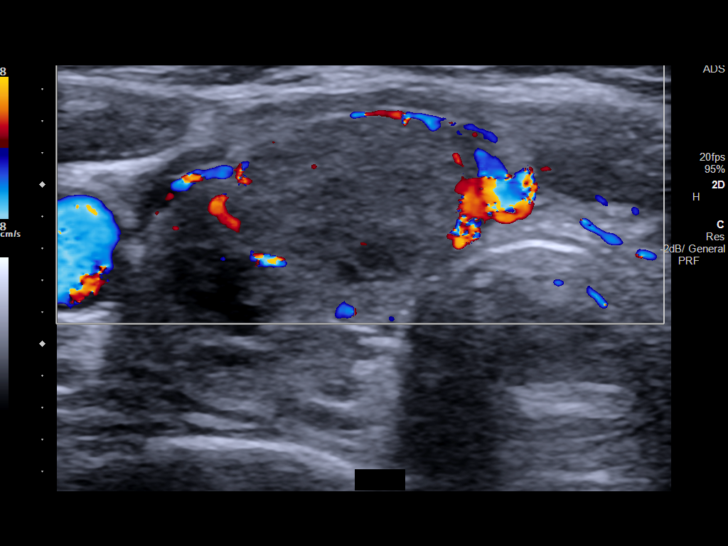
[im 10/11]
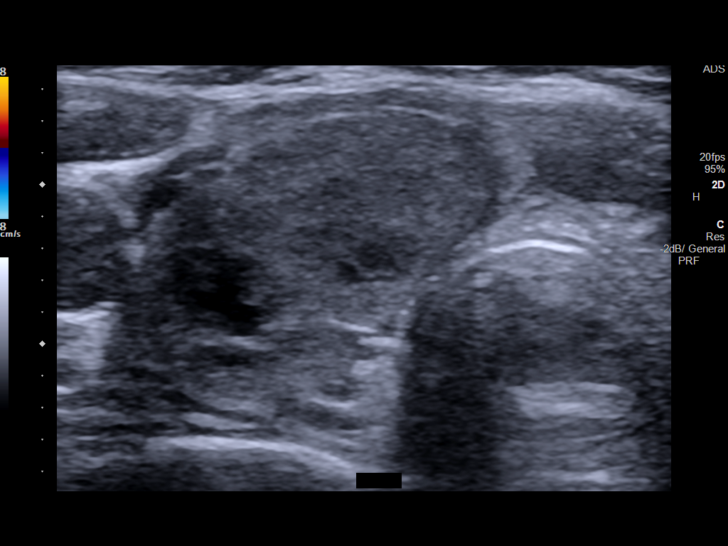
[im 11/11]
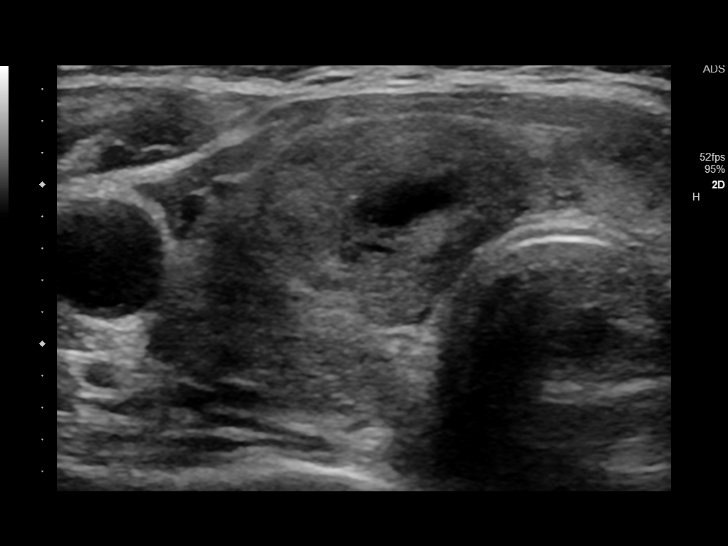

[11 of 11 positions shown; findings below may reference images not displayed]

EXAM:
ULTRASOUND-DIRECTED RIGHT LOBE THYROID NODULE BIOPSY

MEDICATIONS:
None.

ANESTHESIA/SEDATION:
None.

FLUOROSCOPY TIME:  Fluoroscopy Time: None.

COMPLICATIONS:
None.

PROCEDURE:
After discussing the risks and benefits of this procedure with the
patient informed consent was obtained. Right neck was sterilely
prepped and draped. Following local anesthesia with 1% lidocaine and
using ultrasound guidance multiple passes were made in the right
lobe thyroid dominant nodule using 2 separate 25 gauge needles.
Passes were made throughout the nodule. Preliminary cytopathological
results indicated a good specimen. No complications.
IMPRESSION: Successful right thyroid lobe dominant nodule ultrasound-directed
biopsy.

## 2021-08-16 DIAGNOSIS — E041 Nontoxic single thyroid nodule: Secondary | ICD-10-CM | POA: Diagnosis not present

## 2021-08-17 ENCOUNTER — Other Ambulatory Visit: Payer: Self-pay | Admitting: Otolaryngology

## 2021-08-17 DIAGNOSIS — R5382 Chronic fatigue, unspecified: Secondary | ICD-10-CM | POA: Diagnosis not present

## 2021-08-17 DIAGNOSIS — E041 Nontoxic single thyroid nodule: Secondary | ICD-10-CM

## 2021-08-26 ENCOUNTER — Other Ambulatory Visit: Payer: Self-pay

## 2021-08-26 ENCOUNTER — Ambulatory Visit
Admission: RE | Admit: 2021-08-26 | Discharge: 2021-08-26 | Disposition: A | Discharge status: Home / Self Care | Payer: Medicare Other | Source: Ambulatory Visit | Attending: Otolaryngology | Admitting: Otolaryngology

## 2021-08-26 DIAGNOSIS — E041 Nontoxic single thyroid nodule: Secondary | ICD-10-CM | POA: Diagnosis not present

## 2021-08-26 DIAGNOSIS — E042 Nontoxic multinodular goiter: Secondary | ICD-10-CM | POA: Diagnosis not present

## 2021-09-13 ENCOUNTER — Ambulatory Visit (INDEPENDENT_AMBULATORY_CARE_PROVIDER_SITE_OTHER): Payer: Medicare Other

## 2021-09-13 ENCOUNTER — Other Ambulatory Visit: Payer: Self-pay

## 2021-09-13 VITALS — BP 130/88 | HR 85 | Temp 98.6°F | Resp 16 | Ht 66.0 in | Wt 138.0 lb

## 2021-09-13 DIAGNOSIS — Z0000 Encounter for general adult medical examination without abnormal findings: Secondary | ICD-10-CM

## 2021-09-13 NOTE — Patient Instructions (Signed)
Krista Peters , Thank you for taking time to come for your Medicare Wellness Visit. I appreciate your ongoing commitment to your health goals. Please review the following plan we discussed and let me know if I can assist you in the future.   Screening recommendations/referrals: Colonoscopy: done 11/06/17; repeat 10/2022 Mammogram: done 09/17/20; scheduled for 11/02/21 Bone Density: done 10/11/18 Recommended yearly ophthalmology/optometry visit for glaucoma screening and checkup Recommended yearly dental visit for hygiene and checkup  Vaccinations: Influenza vaccine: due Pneumococcal vaccine: done 11/18/13 Tdap vaccine: due Shingles vaccine: done 06/09/18 & 08/28/18   Covid-19:done 01/30/20, 02/27/20, 11/03/20 & 04/12/21  Conditions/risks identified: Keep up the great work!  Next appointment: Follow up in one year for your annual wellness visit    Preventive Care 74 Years and Older, Female Preventive care refers to lifestyle choices and visits with your health care provider that can promote health and wellness. What does preventive care include? A yearly physical exam. This is also called an annual well check. Dental exams once or twice a year. Routine eye exams. Ask your health care provider how often you should have your eyes checked. Personal lifestyle choices, including: Daily care of your teeth and gums. Regular physical activity. Eating a healthy diet. Avoiding tobacco and drug use. Limiting alcohol use. Practicing safe sex. Taking low-dose aspirin every day. Taking vitamin and mineral supplements as recommended by your health care provider. What happens during an annual well check? The services and screenings done by your health care provider during your annual well check will depend on your age, overall health, lifestyle risk factors, and family history of disease. Counseling  Your health care provider may ask you questions about your: Alcohol use. Tobacco use. Drug  use. Emotional well-being. Home and relationship well-being. Sexual activity. Eating habits. History of falls. Memory and ability to understand (cognition). Work and work Statistician. Reproductive health. Screening  You may have the following tests or measurements: Height, weight, and BMI. Blood pressure. Lipid and cholesterol levels. These may be checked every 5 years, or more frequently if you are over 61 years old. Skin check. Lung cancer screening. You may have this screening every year starting at age 93 if you have a 30-pack-year history of smoking and currently smoke or have quit within the past 15 years. Fecal occult blood test (FOBT) of the stool. You may have this test every year starting at age 3. Flexible sigmoidoscopy or colonoscopy. You may have a sigmoidoscopy every 5 years or a colonoscopy every 10 years starting at age 72. Hepatitis C blood test. Hepatitis B blood test. Sexually transmitted disease (STD) testing. Diabetes screening. This is done by checking your blood sugar (glucose) after you have not eaten for a while (fasting). You may have this done every 1-3 years. Bone density scan. This is done to screen for osteoporosis. You may have this done starting at age 106. Mammogram. This may be done every 1-2 years. Talk to your health care provider about how often you should have regular mammograms. Talk with your health care provider about your test results, treatment options, and if necessary, the need for more tests. Vaccines  Your health care provider may recommend certain vaccines, such as: Influenza vaccine. This is recommended every year. Tetanus, diphtheria, and acellular pertussis (Tdap, Td) vaccine. You may need a Td booster every 10 years. Zoster vaccine. You may need this after age 95. Pneumococcal 13-valent conjugate (PCV13) vaccine. One dose is recommended after age 72. Pneumococcal polysaccharide (PPSV23) vaccine. One dose  is recommended after age  24. Talk to your health care provider about which screenings and vaccines you need and how often you need them. This information is not intended to replace advice given to you by your health care provider. Make sure you discuss any questions you have with your health care provider. Document Released: 01/08/2016 Document Revised: 08/31/2016 Document Reviewed: 10/13/2015 Elsevier Interactive Patient Education  2017 [employer] Prevention in the Home Falls can cause injuries. They can happen to people of all ages. There are many things you can do to make your home safe and to help prevent falls. What can I do on the outside of my home? Regularly fix the edges of walkways and driveways and fix any cracks. Remove anything that might make you trip as you walk through a door, such as a raised step or threshold. Trim any bushes or trees on the path to your home. Use bright outdoor lighting. Clear any walking paths of anything that might make someone trip, such as rocks or tools. Regularly check to see if handrails are loose or broken. Make sure that both sides of any steps have handrails. Any raised decks and porches should have guardrails on the edges. Have any leaves, snow, or ice cleared regularly. Use sand or salt on walking paths during winter. Clean up any spills in your garage right away. This includes oil or grease spills. What can I do in the bathroom? Use night lights. Install grab bars by the toilet and in the tub and shower. Do not use towel bars as grab bars. Use non-skid mats or decals in the tub or shower. If you need to sit down in the shower, use a plastic, non-slip stool. Keep the floor dry. Clean up any water that spills on the floor as soon as it happens. Remove soap buildup in the tub or shower regularly. Attach bath mats securely with double-sided non-slip rug tape. Do not have throw rugs and other things on the floor that can make you trip. What can I do in the  bedroom? Use night lights. Make sure that you have a light by your bed that is easy to reach. Do not use any sheets or blankets that are too big for your bed. They should not hang down onto the floor. Have a firm chair that has side arms. You can use this for support while you get dressed. Do not have throw rugs and other things on the floor that can make you trip. What can I do in the kitchen? Clean up any spills right away. Avoid walking on wet floors. Keep items that you use a lot in easy-to-reach places. If you need to reach something above you, use a strong step stool that has a grab bar. Keep electrical cords out of the way. Do not use floor polish or wax that makes floors slippery. If you must use wax, use non-skid floor wax. Do not have throw rugs and other things on the floor that can make you trip. What can I do with my stairs? Do not leave any items on the stairs. Make sure that there are handrails on both sides of the stairs and use them. Fix handrails that are broken or loose. Make sure that handrails are as long as the stairways. Check any carpeting to make sure that it is firmly attached to the stairs. Fix any carpet that is loose or worn. Avoid having throw rugs at the top or bottom of the stairs.  If you do have throw rugs, attach them to the floor with carpet tape. Make sure that you have a light switch at the top of the stairs and the bottom of the stairs. If you do not have them, ask someone to add them for you. What else can I do to help prevent falls? Wear shoes that: Do not have high heels. Have rubber bottoms. Are comfortable and fit you well. Are closed at the toe. Do not wear sandals. If you use a stepladder: Make sure that it is fully opened. Do not climb a closed stepladder. Make sure that both sides of the stepladder are locked into place. Ask someone to hold it for you, if possible. Clearly mark and make sure that you can see: Any grab bars or  handrails. First and last steps. Where the edge of each step is. Use tools that help you move around (mobility aids) if they are needed. These include: Canes. Walkers. Scooters. Crutches. Turn on the lights when you go into a dark area. Replace any light bulbs as soon as they burn out. Set up your furniture so you have a clear path. Avoid moving your furniture around. If any of your floors are uneven, fix them. If there are any pets around you, be aware of where they are. Review your medicines with your doctor. Some medicines can make you feel dizzy. This can increase your chance of falling. Ask your doctor what other things that you can do to help prevent falls. This information is not intended to replace advice given to you by your health care provider. Make sure you discuss any questions you have with your health care provider. Document Released: 10/08/2009 Document Revised: 05/19/2016 Document Reviewed: 01/16/2015 Elsevier Interactive Patient Education  2017 [employer].

## 2021-09-13 NOTE — Progress Notes (Signed)
Subjective:   Krista Peters is a 75 y.o. female who presents for Medicare Annual (Subsequent) preventive examination.  Review of Systems     Cardiac Risk Factors include: advanced age (>2mn, >>57women)     Objective:    Today's Vitals   09/13/21 1012  BP: 130/88  Pulse: 85  Resp: 16  Temp: 98.6 F (37 C)  TempSrc: Oral  SpO2: 99%  Weight: 138 lb (62.6 kg)  Height: '5\' 6"'$  (1.676 m)   Body mass index is 22.27 kg/m.  Advanced Directives 09/13/2021 09/09/2020 09/09/2019 02/27/2019 09/03/2018 11/06/2017 08/31/2017  Does Patient Have a Medical Advance Directive? Yes Yes Yes Yes Yes Yes Yes  Type of AParamedicof ALake IsabellaLiving will HQueen ValleyLiving will HState LineLiving will HWestonLiving will HScrantonLiving will HGolfwill  Does patient want to make changes to medical advance directive? - - - No - Patient declined - No - Patient declined -  Copy of HP[place]ent[place] Chart? Yes - validated most recent copy scanned in chart (See row information) Yes - validated most recent copy scanned in chart (See row information) Yes - validated most recent copy scanned in chart (See row information) Yes - validated most recent copy scanned in chart (See row information) Yes Yes -    Current Medications (verified) Outpatient Encounter Medications as of 09/13/2021  Medication Sig   buPROPion (WELLBUTRIN XL) 150 MG 24 hr tablet TAKE 1 TABLET(150 MG) BY MOUTH DAILY   levothyroxine (SYNTHROID) 25 MCG tablet    Multiple Vitamins-Minerals (WOMENS MULTIVITAMIN PO) Take by mouth. Plant based   oxybutynin (DITROPAN) 5 MG tablet TAKE 1 TABLET BY MOUTH EVERY DAY   [DISCONTINUED] atorvastatin (LIPITOR) 10 MG tablet Take 1 tablet (10 mg total) by mouth daily.   No facility-administered encounter medications on file as of 09/13/2021.    Allergies  (verified) Lipitor [atorvastatin], Naproxen, Penicillins, and Pneumovax 23 [pneumococcal vac polyvalent]   History: Past Medical History:  Diagnosis Date   Depression    Hearing aid worn    bilateral   Thyroid disease    Past Surgical History:  Procedure Laterality Date   APPENDECTOMY     BIOPSY THYROID  2020   BUNIONECTOMY Left 02/27/2019   Procedure: DOUBLE OSTECTOMY;  Surgeon: FSamara Deist DPM;  [facility]  Service: Podiatry;  Laterality: Left;  GENERAL WITH LOCAL   COLONOSCOPY  2015   Large adenoma removed laparoscopically   COLONOSCOPY WITH PROPOFOL N/A 11/06/2017   Procedure: COLONOSCOPY WITH PROPOFOL;  Surgeon: WLucilla Lame MD;  [facility]  Service: Endoscopy;  Laterality: N/A;   LAPAROSCOPIC ILEOCECECTOMY  2015   Serrated adenoma   TONSILLECTOMY     Family History  Problem Relation Age of Onset   Diabetes Mother    Diabetes Brother    CAD Father    CAD Brother    Social History   Socioeconomic History   Marital status: Married    Spouse name: Not on file   Number of children: 4   Years of education: Not on file   Highest education level: 12th grade  Occupational History   Occupation: Retired  Tobacco Use   Smoking status: Former    Packs/day: 0.25    Years: 10.00    Pack years: 2.50    Types: Cigarettes    Quit date: 1970    Years since quitting: 5407  Smokeless tobacco: Never   Tobacco comments:    smoking cessation materials not required  Vaping Use   Vaping Use: Never used  Substance and Sexual Activity   Alcohol use: Yes    Comment: occasionally   Drug use: Never   Sexual activity: Not Currently  Other Topics Concern   Not on file  Social History Narrative   Not on file   Social Determinants of Health   Financial Resource Strain: Low Risk    Difficulty of Paying Living Expenses: Not hard at all  Food Insecurity: No Food Insecurity   Worried About Charity fundraiser in the Last Year: Never  true   [place] in the Last Year: Never true  Transportation Needs: No Transportation Needs   Lack of Transportation (Medical): No   Lack of Transportation (Non-Medical): No  Physical Activity: Insufficiently Active   Days of Exercise per Week: 4 days   Minutes of Exercise per Session: 30 min  Stress: Stress Concern Present   Feeling of Stress : To some extent  Social Connections: Moderately Isolated   Frequency of Communication with Friends and Family: More than three times a week   Frequency of Social Gatherings with Friends and Family: Three times a week   Attends Religious Services: Never   Active Member of Clubs or Organizations: No   Attends Archivist Meetings: Never   Marital Status: Married    Tobacco Counseling Counseling given: Not Answered Tobacco comments: smoking cessation materials not required   Clinical Intake:  Pre-visit preparation completed: Yes  Pain : No/denies pain     BMI - recorded: 22.27 Nutritional Status: BMI of 19-24  Normal Nutritional Risks: None Diabetes: No  How often do you need to have someone help you when you read instructions, pamphlets, or other written materials from your doctor or pharmacy?: 1 - Never    Interpreter Needed?: No  Information entered by :: Clemetine Marker LPN   Activities of Daily Living In your present state of health, do you have any difficulty performing the following activities: 09/13/2021  Hearing? Y  Comment wears hearing aids  Vision? N  Difficulty concentrating or making decisions? N  Walking or climbing stairs? N  Dressing or bathing? N  Doing errands, shopping? N  Preparing Food and eating ? N  Using the Toilet? N  In the past six months, have you accidently leaked urine? N  Do you have problems with loss of bowel control? N  Managing your Medications? N  Managing your Finances? N  Housekeeping or managing your Housekeeping? N  Some recent data might be hidden    Patient Care  Team: [name], MD as PCP - General (Internal Medicine) [facility] as Consulting Physician (Dermatology)  Indicate any recent Medical Services you may have received from other than [facility] providers in the past year (date may be approximate).     Assessment:   This is a routine wellness examination for Krista Peters.  Hearing/Vision screen Hearing Screening - Comments:: Pt wears hearing aids maintained by  ENT Vision Screening - Comments:: Annual vision screenings done at [facility]  Dietary issues and exercise activities discussed: Current Exercise Habits: Home exercise routine, Type of exercise: walking, Time (Minutes): 30, Frequency (Times/Week): 3, Weekly Exercise (Minutes/Week): 90, Intensity: Mild, Exercise limited by: None identified   Goals Addressed             This Visit's Progress    DIET - INCREASE  WATER INTAKE   On track    Recommend to drink at least 6-8 8oz glasses of water per day.       Depression Screen PHQ 2/9 Scores 09/13/2021 10/21/2020 09/09/2020 03/23/2020 10/07/2019 09/09/2019 08/07/2019  PHQ - 2 Score 1 1 0 0 0 0 0  PHQ- 9 Score 3 1 - 0 - - 0    Fall Risk Fall Risk  09/13/2021 10/21/2020 09/09/2020 03/23/2020 09/09/2019  Falls in the past year? 0 0 0 0 0  Number falls in past yr: 0 - 0 0 0  Injury with Fall? 0 - 0 0 0  Risk for fall due to : No Fall Risks - No Fall Risks No Fall Risks -  Risk for fall due to: Comment - - - - -  Follow up Falls prevention discussed Falls evaluation completed Falls prevention discussed Falls evaluation completed Falls prevention discussed    FALL RISK PREVENTION PERTAINING TO THE HOME:  Any stairs in or around the home? No  If so, are there any without handrails? No  Home free of loose throw rugs in walkways, pet beds, electrical cords, etc? Yes  Adequate lighting in your home to reduce risk of falls? Yes   ASSISTIVE DEVICES UTILIZED TO PREVENT FALLS:  Life alert? No  Use of a cane,  walker or w/c? No  Grab bars in the bathroom? Yes  Shower chair or bench in shower? Yes  Elevated toilet seat or a handicapped toilet? Yes   TIMED UP AND GO:  Was the test performed? Yes .  Length of time to ambulate 10 feet: 5 sec.   Gait steady and fast without use of assistive device  Cognitive Function: Normal cognitive status assessed by direct observation by this Nurse Health Advisor. No abnormalities found.       6CIT Screen 09/09/2019 09/03/2018 08/31/2017  What Year? 0 points 0 points 0 points  What month? 0 points 0 points 0 points  What time? 0 points 0 points 0 points  Count back from 20 0 points 0 points 0 points  Months in reverse 0 points 0 points 0 points  Repeat phrase 0 points 0 points 0 points  Total Score 0 0 0    Immunizations Immunization History  Administered Date(s) Administered   Fluad Quad(high Dose 65+) 09/10/2019   Influenza, High Dose Seasonal PF 09/25/2013, 10/30/2018   Influenza,inj,Quad PF,6+ Mos 09/11/2015, 09/20/2016, 08/31/2017   Influenza-Unspecified 10/30/2018, 10/15/2020   Moderna SARS-COV2 Booster Vaccination 11/03/2020, 04/12/2021   Moderna Sars-Covid-2 Vaccination 01/30/2020, 02/27/2020   Pneumococcal Polysaccharide-23 12/28/2011, 11/18/2013   Tdap 12/27/2010   Zoster Recombinat (Shingrix) 06/09/2018, 08/28/2018   Zoster, Live 08/02/2012    TDAP status: Due, Education has been provided regarding the importance of this vaccine. Advised may receive this vaccine at local pharmacy or Health Dept. Aware to provide a copy of the vaccination record if obtained from local pharmacy or Health Dept. Verbalized acceptance and understanding.  Flu Vaccine status: Due, Education has been provided regarding the importance of this vaccine. Advised may receive this vaccine at local pharmacy or Health Dept. Aware to provide a copy of the vaccination record if obtained from local pharmacy or Health Dept. Verbalized acceptance and  understanding.  Pneumococcal vaccine status: Up to date  Covid-19 vaccine status: Completed vaccines  Qualifies for Shingles Vaccine? Yes   Zostavax completed Yes   Shingrix Completed?: Yes  Screening Tests Health Maintenance  Topic Date Due   TETANUS/TDAP  12/27/2020   INFLUENZA VACCINE  07/26/2021   MAMMOGRAM  09/17/2021   COLONOSCOPY (Pts 45-62yr Insurance coverage will need to be confirmed)  11/06/2022   DEXA SCAN  Completed   COVID-19 Vaccine  Completed   Zoster Vaccines- Shingrix  Completed   Hepatitis C Screening  Addressed   HPV VACCINES  Aged Out    Health Maintenance  Health Maintenance Due  Topic Date Due   TETANUS/TDAP  12/27/2020   INFLUENZA VACCINE  07/26/2021    Colorectal cancer screening: Type of screening: Colonoscopy. Completed 11/06/17. Repeat every 5 years  Mammogram status: Completed 09/17/20. Repeat every year. Scheduled for 11/02/21  Bone Density status: Completed 10/11/18. Results reflect: Bone density results: OSTEOPOROSIS. Repeat every 2 years.  Lung Cancer Screening: (Low Dose CT Chest recommended if Age 74-80years, 30 pack-year currently smoking OR have quit w/in 15years.) does not qualify.   Additional Screening:  Hepatitis C Screening: does qualify; Completed 07/07/16  Vision Screening: Recommended annual ophthalmology exams for early detection of glaucoma and other disorders of the eye. Is the patient up to date with their annual eye exam?  Yes  Who is the provider or what is the [facility]   Dental Screening: Recommended annual dental exams for proper oral hygiene  Community Resource Referral / Chronic Care Management: CRR required this visit?  No   CCM required this visit?  No      Plan:     I have personally reviewed and noted the following in the patient's chart:   Medical and social history Use of alcohol, tobacco or illicit drugs  Current  medications and supplements including opioid prescriptions.  Functional ability and status Nutritional status Physical activity Advanced directives List of other physicians Hospitalizations, surgeries, and ER visits in previous 12 months Vitals Screenings to include cognitive, depression, and falls Referrals and appointments  In addition, I have reviewed and discussed with patient certain preventive protocols, quality metrics, and best practice recommendations. A written personalized care plan for preventive services as well as general preventive health recommendations were provided to patient.     KClemetine Marker LPN   [id number]  Nurse Notes: pt c/o losing hair, frequent headaches around eyes and forehead; scheduled for eye appt in December. Pt also states stool was darker in color but stopped eating as many blueberries and prunes approx 1 week ago and has seen some improvement; denies noticeable bleeding. Pt seen by Dr. JKathyrn Sheriffmid- August for TSH; WNL per patient and thyroid u/a - also WNL. Pt scheduled for CPE 10/25/21, advised to schedule appt earlier if needed.

## 2021-10-11 ENCOUNTER — Other Ambulatory Visit: Payer: Self-pay | Admitting: Internal Medicine

## 2021-10-11 DIAGNOSIS — F324 Major depressive disorder, single episode, in partial remission: Secondary | ICD-10-CM

## 2021-10-11 NOTE — Telephone Encounter (Signed)
Appointment scheduled 10/25/21 Requested Prescriptions  Pending Prescriptions Disp Refills  . buPROPion (WELLBUTRIN XL) 150 MG 24 hr tablet [Pharmacy Med Name: BUPROPION XL 150MG  TABLETS (24 H)] 90 tablet 1    Sig: TAKE 1 TABLET(150 MG) BY MOUTH DAILY     Psychiatry: Antidepressants - bupropion Failed - 10/11/2021  3:35 AM      Failed - Valid encounter within last 6 months    Recent Outpatient Visits          11 months ago Annual physical exam   [facility] [name], MD   1 year ago Dyspepsia   [facility] [name], MD   2 years ago Annual physical exam   [facility] [name], MD   2 years ago Thyroid nodule   [facility] [name], MD   2 years ago Spasm of thoracic back muscle   [facility] [name], MD      Future Appointments            In 2 weeks [name], MD [facility] [place] - Completed PHQ-2 or PHQ-9 in the last 360 days      Passed - Last BP in normal range    BP Readings from Last 1 Encounters:  09/13/21 130/88

## 2021-10-25 ENCOUNTER — Other Ambulatory Visit: Payer: Self-pay

## 2021-10-25 ENCOUNTER — Ambulatory Visit (INDEPENDENT_AMBULATORY_CARE_PROVIDER_SITE_OTHER): Payer: Medicare Other | Admitting: Internal Medicine

## 2021-10-25 ENCOUNTER — Encounter: Payer: Self-pay | Admitting: Internal Medicine

## 2021-10-25 VITALS — BP 112/78 | HR 79 | Ht 66.0 in | Wt 137.8 lb

## 2021-10-25 DIAGNOSIS — Z0000 Encounter for general adult medical examination without abnormal findings: Secondary | ICD-10-CM | POA: Diagnosis not present

## 2021-10-25 DIAGNOSIS — Z1231 Encounter for screening mammogram for malignant neoplasm of breast: Secondary | ICD-10-CM

## 2021-10-25 DIAGNOSIS — E041 Nontoxic single thyroid nodule: Secondary | ICD-10-CM

## 2021-10-25 DIAGNOSIS — F325 Major depressive disorder, single episode, in full remission: Secondary | ICD-10-CM

## 2021-10-25 DIAGNOSIS — M810 Age-related osteoporosis without current pathological fracture: Secondary | ICD-10-CM | POA: Diagnosis not present

## 2021-10-25 DIAGNOSIS — E782 Mixed hyperlipidemia: Secondary | ICD-10-CM

## 2021-10-25 MED ORDER — BUPROPION HCL ER (XL) 150 MG PO TB24: ORAL_TABLET | ORAL | 3 refills | Status: DC

## 2021-10-25 MED ORDER — OXYBUTYNIN CHLORIDE 5 MG PO TABS: 5.0000 mg | ORAL_TABLET | Freq: Every day | ORAL | 3 refills | Status: DC

## 2021-10-25 NOTE — Progress Notes (Signed)
Date:  10/25/2021   Name:  Krista Peters   DOB:  14-May-1946   MRN:  [id number]   Chief Complaint: Annual Exam (Breast Exam. No pap- aged out.) Krista Peters is a 75 y.o. female who presents today for her Complete Annual Exam. She feels well. She reports exercising - walking. She reports she is sleeping well. Breast complaints - none.  Mammogram: 08/2020 [facility] - scheduled for November. DEXA: 09/2018 osteopenia improved from 2017. Pap smear: discontinued Colonoscopy: 2018 repeat 2023  Immunization History  Administered Date(s) Administered   Fluad Quad(high Dose 65+) 09/10/2019   Influenza, High Dose Seasonal PF 09/25/2013, 10/30/2018   Influenza,inj,Quad PF,6+ Mos 09/11/2015, 09/20/2016, 08/31/2017   Influenza-Unspecified 10/30/2018, 10/15/2020, 10/01/2021   Moderna Covid-19 Vaccine Bivalent Booster 52yrs & up 10/01/2021   Moderna SARS-COV2 Booster Vaccination 11/03/2020, 04/12/2021   Moderna Sars-Covid-2 Vaccination 01/30/2020, 02/27/2020   Pneumococcal Polysaccharide-23 12/28/2011, 11/18/2013   Tdap 12/27/2010   Zoster Recombinat (Shingrix) 06/09/2018, 08/28/2018   Zoster, Live 08/02/2012    Thyroid Problem Presents for follow-up (followed by Dr. Kathyrn Sheriff) visit. Patient reports no anxiety, constipation, diarrhea, fatigue, palpitations or tremors. The symptoms have been stable (recent US showed no change and labs were normal).  Depression        This is a chronic problem.  The problem has been resolved since onset.  Associated symptoms include no fatigue and no headaches.  Treatments tried: bupropion.  Compliance with treatment is good.  Previous treatment provided significant relief.  Past medical history includes thyroid problem.    Lab Results  Component Value Date   CREATININE 0.76 10/21/2020   BUN 8 10/21/2020   NA 138 10/21/2020   K 4.6 10/21/2020   CL 99 10/21/2020   CO2 25 10/21/2020   Lab Results  Component Value Date   CHOL 218 (H) 10/21/2020    HDL 65 10/21/2020   LDLCALC 134 (H) 10/21/2020   TRIG 105 10/21/2020   CHOLHDL 3.4 10/21/2020   Lab Results  Component Value Date   TSH 1.870 10/21/2020   No results found for: HGBA1C Lab Results  Component Value Date   WBC 5.1 10/21/2020   HGB 13.9 10/21/2020   HCT 42.4 10/21/2020   MCV 94 10/21/2020   PLT 245 10/21/2020   Lab Results  Component Value Date   ALT 11 10/21/2020   AST 14 10/21/2020   ALKPHOS 87 10/21/2020   BILITOT 0.3 10/21/2020  Last vitamin D Lab Results  Component Value Date   VD25OH 40.3 08/30/2018      Review of Systems  Constitutional:  Negative for chills, fatigue and fever.  HENT:  Positive for hearing loss. Negative for congestion, tinnitus, trouble swallowing and voice change.   Eyes:  Negative for visual disturbance.  Respiratory:  Negative for cough, chest tightness, shortness of breath and wheezing.   Cardiovascular:  Negative for chest pain, palpitations and leg swelling.  Gastrointestinal:  Negative for abdominal pain, constipation, diarrhea and vomiting.  Endocrine: Negative for polydipsia and polyuria.  Genitourinary:  Negative for dysuria, frequency, genital sores, vaginal bleeding and vaginal discharge.  Musculoskeletal:  Negative for arthralgias, gait problem and joint swelling.  Skin:  Negative for color change and rash.  Neurological:  Negative for dizziness, tremors, light-headedness and headaches.  Hematological:  Negative for adenopathy. Does not bruise/bleed easily.  Psychiatric/Behavioral:  Positive for depression. Negative for dysphoric mood and sleep disturbance. The patient is not nervous/anxious.    Patient Active Problem List  Diagnosis Date Noted   Nontoxic single thyroid nodule 09/16/2019   Mass of skin of left hand 04/28/2018   Osteoporosis of multiple sites without pathological fracture 10/21/2016   Urge incontinence of urine 09/11/2015   Major depression, single episode, in complete remission ([place]) 09/11/2015    Mixed hyperlipidemia 09/11/2015   Irritable bowel syndrome without diarrhea 09/11/2015   Hx of adenomatous colonic polyps 08/27/2014   Adhesive capsulitis 03/26/2014   Disequilibrium 06/13/2008    Allergies  Allergen Reactions   Lipitor [Atorvastatin] Other (See Comments)    Leg pain   Naproxen Rash   Penicillins Rash   Pneumovax 23 [Pneumococcal Vac Polyvalent] Rash    Past Surgical History:  Procedure Laterality Date   APPENDECTOMY     BIOPSY THYROID  2020   BUNIONECTOMY Left 02/27/2019   Procedure: DOUBLE OSTECTOMY;  Surgeon: Samara Deist, DPM;  Location: [facility];  Service: Podiatry;  Laterality: Left;  GENERAL WITH LOCAL   COLONOSCOPY  2015   Large adenoma removed laparoscopically   COLONOSCOPY WITH PROPOFOL N/A 11/06/2017   Procedure: COLONOSCOPY WITH PROPOFOL;  Surgeon: Lucilla Lame, MD;  Location: [place];  Service: Endoscopy;  Laterality: N/A;   LAPAROSCOPIC ILEOCECECTOMY  2015   Serrated adenoma   TONSILLECTOMY      Social History   Tobacco Use   Smoking status: Former    Packs/day: 0.25    Years: 10.00    Pack years: 2.50    Types: Cigarettes    Quit date: 1970    Years since quitting: 52.8   Smokeless tobacco: Never   Tobacco comments:    smoking cessation materials not required  Vaping Use   Vaping Use: Never used  Substance Use Topics   Alcohol use: Yes    Comment: occasionally   Drug use: Never     Medication list has been reviewed and updated.  Current Meds  Medication Sig   buPROPion (WELLBUTRIN XL) 150 MG 24 hr tablet TAKE 1 TABLET(150 MG) BY MOUTH DAILY   levothyroxine (SYNTHROID) 25 MCG tablet    Multiple Vitamins-Minerals (WOMENS MULTIVITAMIN PO) Take by mouth. Plant based   oxybutynin (DITROPAN) 5 MG tablet TAKE 1 TABLET BY MOUTH EVERY DAY    PHQ 2/9 Scores 10/25/2021 09/13/2021 10/21/2020 09/09/2020  PHQ - 2 Score 0 1 1 0  PHQ- 9 Score 0 3 1 -    GAD 7 : Generalized Anxiety Score 10/25/2021 10/21/2020   Nervous, Anxious, on Edge 0 1  Control/stop worrying 0 0  Worry too much - different things 0 0  Trouble relaxing 0 1  Restless 0 0  Easily annoyed or irritable 0 0  Afraid - awful might happen 0 0  Total GAD 7 Score 0 2  Anxiety Difficulty Not difficult at all Not difficult at all    BP Readings from Last 3 Encounters:  10/25/21 112/78  09/13/21 130/88  10/21/20 134/84    Physical Exam Vitals and nursing note reviewed.  Constitutional:      General: She is not in acute distress.    Appearance: She is well-developed.  HENT:     Head: Normocephalic and atraumatic.     Right Ear: Tympanic membrane and ear canal normal. Decreased hearing noted.     Left Ear: Tympanic membrane and ear canal normal. Decreased hearing noted.     Nose:     Right Sinus: No maxillary sinus tenderness.     Left Sinus: No maxillary sinus tenderness.  Eyes:  General: No scleral icterus.       Right eye: No discharge.        Left eye: No discharge.     Conjunctiva/sclera: Conjunctivae normal.  Neck:     Thyroid: No thyromegaly.     Vascular: No carotid bruit.  Cardiovascular:     Rate and Rhythm: Normal rate and regular rhythm.     Pulses: Normal pulses.     Heart sounds: Normal heart sounds.  Pulmonary:     Effort: Pulmonary effort is normal. No respiratory distress.     Breath sounds: No wheezing.  Chest:  Breasts:    Right: No mass, nipple discharge, skin change or tenderness.     Left: No mass, nipple discharge, skin change or tenderness.  Abdominal:     General: Bowel sounds are normal.     Palpations: Abdomen is soft.     Tenderness: There is no abdominal tenderness.  Musculoskeletal:     Cervical back: Normal range of motion. No erythema.     Right lower leg: No edema.     Left lower leg: No edema.  Lymphadenopathy:     Cervical: No cervical adenopathy.  Skin:    General: Skin is warm and dry.     Findings: No rash.  Neurological:     Mental Status: She is alert and  oriented to person, place, and time.     Cranial Nerves: No cranial nerve deficit.     Sensory: No sensory deficit.     Deep Tendon Reflexes: Reflexes are normal and symmetric.  Psychiatric:        Attention and Perception: Attention normal.        Mood and Affect: Mood normal.    Wt Readings from Last 3 Encounters:  10/25/21 137 lb 12.8 oz (62.5 kg)  09/13/21 138 lb (62.6 kg)  10/21/20 138 lb (62.6 kg)    BP 112/78   Pulse 79   Ht 5\' 6"  (1.676 m)   Wt 137 lb 12.8 oz (62.5 kg)   SpO2 99%   BMI 22.24 kg/m   Assessment and Plan: 1. Annual physical exam Normal exam; continue healthy diet, exercise - CBC with Differential/Platelet - Comprehensive metabolic panel  2. Encounter for screening mammogram for breast cancer Scheduled at [facility]  3. Nontoxic single thyroid nodule Followed by Dr. Kathyrn Sheriff  4. Major depression, single episode, in complete remission ([place]) Clinically stable on current regimen with good control of symptoms, No SI or HI. Will continue current therapy. - buPROPion (WELLBUTRIN XL) 150 MG 24 hr tablet; TAKE 1 TABLET(150 MG) BY MOUTH DAILY  Dispense: 90 tablet; Refill: 3  5. Mixed hyperlipidemia Check labs; continue low fat diet - Lipid panel  6. Osteoporosis of multiple sites without pathological fracture She declines repeat DEXA - was intolerant of Fosamax and does not desire other treatment at this time. - VITAMIN D 25 Hydroxy (Vit-D Deficiency, Fractures)   Partially dictated using Editor, commissioning. Any errors are unintentional.  Halina Maidens, MD [facility]  10/25/2021

## 2021-10-26 LAB — CBC WITH DIFFERENTIAL/PLATELET
Basophils Absolute: 0 10*3/uL (ref 0.0–0.2)
Basos: 1 %
EOS (ABSOLUTE): 0.2 10*3/uL (ref 0.0–0.4)
Eos: 5 %
Hematocrit: 40.1 % (ref 34.0–46.6)
Hemoglobin: 13.4 g/dL (ref 11.1–15.9)
Immature Grans (Abs): 0 10*3/uL (ref 0.0–0.1)
Immature Granulocytes: 0 %
Lymphocytes Absolute: 1.7 10*3/uL (ref 0.7–3.1)
Lymphs: 32 %
MCH: 31.2 pg (ref 26.6–33.0)
MCHC: 33.4 g/dL (ref 31.5–35.7)
MCV: 94 fL (ref 79–97)
Monocytes Absolute: 0.5 10*3/uL (ref 0.1–0.9)
Monocytes: 9 %
Neutrophils Absolute: 2.8 10*3/uL (ref 1.4–7.0)
Neutrophils: 53 %
Platelets: 244 10*3/uL (ref 150–450)
RBC: 4.29 x10E6/uL (ref 3.77–5.28)
RDW: 12 % (ref 11.7–15.4)
WBC: 5.3 10*3/uL (ref 3.4–10.8)

## 2021-10-26 LAB — COMPREHENSIVE METABOLIC PANEL
ALT: 12 IU/L (ref 0–32)
AST: 15 IU/L (ref 0–40)
Albumin/Globulin Ratio: 1.8 (ref 1.2–2.2)
Albumin: 4.3 g/dL (ref 3.7–4.7)
Alkaline Phosphatase: 80 IU/L (ref 44–121)
BUN/Creatinine Ratio: 19 (ref 12–28)
BUN: 15 mg/dL (ref 8–27)
Bilirubin Total: 0.4 mg/dL (ref 0.0–1.2)
CO2: 27 mmol/L (ref 20–29)
Calcium: 9.4 mg/dL (ref 8.7–10.3)
Chloride: 102 mmol/L (ref 96–106)
Creatinine, Ser: 0.77 mg/dL (ref 0.57–1.00)
Globulin, Total: 2.4 g/dL (ref 1.5–4.5)
Glucose: 93 mg/dL (ref 70–99)
Potassium: 4.9 mmol/L (ref 3.5–5.2)
Sodium: 138 mmol/L (ref 134–144)
Total Protein: 6.7 g/dL (ref 6.0–8.5)
eGFR: 81 mL/min/{1.73_m2} (ref >59)

## 2021-10-26 LAB — LIPID PANEL
Chol/HDL Ratio: 3.2 ratio (ref 0.0–4.4)
Cholesterol, Total: 205 mg/dL — ABNORMAL HIGH (ref 100–199)
HDL: 65 mg/dL (ref >39)
LDL Chol Calc (NIH): 123 mg/dL — ABNORMAL HIGH (ref 0–99)
Triglycerides: 95 mg/dL (ref 0–149)
VLDL Cholesterol Cal: 17 mg/dL (ref 5–40)

## 2021-10-26 LAB — VITAMIN D 25 HYDROXY (VIT D DEFICIENCY, FRACTURES): Vit D, 25-Hydroxy: 62 ng/mL (ref 30.0–100.0)

## 2021-11-09 DIAGNOSIS — L821 Other seborrheic keratosis: Secondary | ICD-10-CM | POA: Diagnosis not present

## 2021-11-09 DIAGNOSIS — Z872 Personal history of diseases of the skin and subcutaneous tissue: Secondary | ICD-10-CM | POA: Diagnosis not present

## 2021-11-09 DIAGNOSIS — Z86018 Personal history of other benign neoplasm: Secondary | ICD-10-CM | POA: Diagnosis not present

## 2021-11-09 DIAGNOSIS — B353 Tinea pedis: Secondary | ICD-10-CM | POA: Diagnosis not present

## 2021-11-09 DIAGNOSIS — L578 Other skin changes due to chronic exposure to nonionizing radiation: Secondary | ICD-10-CM | POA: Diagnosis not present

## 2021-11-09 DIAGNOSIS — L720 Epidermal cyst: Secondary | ICD-10-CM | POA: Diagnosis not present

## 2021-11-09 DIAGNOSIS — L570 Actinic keratosis: Secondary | ICD-10-CM | POA: Diagnosis not present

## 2021-11-30 DIAGNOSIS — H353132 Nonexudative age-related macular degeneration, bilateral, intermediate dry stage: Secondary | ICD-10-CM | POA: Diagnosis not present

## 2021-12-31 ENCOUNTER — Ambulatory Visit: Payer: Self-pay | Admitting: *Deleted

## 2021-12-31 NOTE — Telephone Encounter (Signed)
°  Chief Complaint: + COVID Symptoms: sore throat, slight cough, body aches Frequency: symptoms started yesterday Pertinent Negatives: Patient denies fever, SOB Disposition: [] ED /[] Urgent Care (no appt availability in office) / [] Appointment(In office/virtual)/ []  [facility]/ [x] Home Care/ [] Refused Recommended Disposition /[] [facility]/ []  Follow-up with PCP Additional Notes: + COVID- patient advised per COVID protocol- treatment/isolation, low risk, age 76- call sent for provider review

## 2021-12-31 NOTE — Telephone Encounter (Signed)
Summary: Covid   Pt called in stating she tested positive for covid and has a sore throat, pt wanted to speak with a nurse to see about what she needs to do, please advise.      Reason for Disposition  [1] HIGH RISK for severe COVID complications (e.g., weak immune system, age > 39 years, obesity with BMI > 25, pregnant, chronic lung disease or other chronic medical condition) AND [2] COVID symptoms (e.g., cough, fever)  (Exceptions: Already seen by PCP and no new or worsening symptoms.)  Answer Assessment - Initial Assessment Questions 1. COVID-19 DIAGNOSIS: "Who made your COVID-19 diagnosis?" "Was it confirmed by a positive lab test or self-test?" If not diagnosed by a doctor (or NP/PA), ask "Are there lots of cases (community spread) where you live?" Note: See public health department website, if unsure.     + COVID home test today 2. COVID-19 EXPOSURE: "Was there any known exposure to COVID before the symptoms began?" CDC Definition of close contact: within 6 feet (2 meters) for a total of 15 minutes or more over a 24-hour period.      Husband-+ COVID 3. ONSET: "When did the COVID-19 symptoms start?"      yesterday 4. WORST SYMPTOM: "What is your worst symptom?" (e.g., cough, fever, shortness of breath, muscle aches)     Sore throat 5. COUGH: "Do you have a cough?" If Yes, ask: "How bad is the cough?"       Slight cough yesterday 6. FEVER: "Do you have a fever?" If Yes, ask: "What is your temperature, how was it measured, and when did it start?"     no 7. RESPIRATORY STATUS: "Describe your breathing?" (e.g., shortness of breath, wheezing, unable to speak)      Normal breathing 8. BETTER-SAME-WORSE: "Are you getting better, staying the same or getting worse compared to yesterday?"  If getting worse, ask, "In what way?"     Same-worse- increasing symptoms 9. HIGH RISK DISEASE: "Do you have any chronic medical problems?" (e.g., asthma, heart or lung disease, weak immune system, obesity,  etc.)     Age 76. VACCINE: "Have you had the COVID-19 vaccine?" If Yes, ask: "Which one, how many shots, when did you get it?"       Yes 11. BOOSTER: "Have you received your COVID-19 booster?" If Yes, ask: "Which one and when did you get it?"       Yes- 10/01/21- Moderna 12. PREGNANCY: "Is there any chance you are pregnant?" "When was your last menstrual period?"       na 13. OTHER SYMPTOMS: "Do you have any other symptoms?"  (e.g., chills, fatigue, headache, loss of smell or taste, muscle pain, sore throat)       Body ache 14. O2 SATURATION MONITOR:  "Do you use an oxygen saturation monitor (pulse oximeter) at home?" If Yes, ask "What is your reading (oxygen level) today?" "What is your usual oxygen saturation reading?" (e.g., 95%)       no  Protocols used: Coronavirus (COVID-19) Diagnosed or Suspected-A-AH

## 2022-01-01 ENCOUNTER — Other Ambulatory Visit: Payer: Self-pay | Admitting: Internal Medicine

## 2022-01-01 NOTE — Telephone Encounter (Signed)
Requested Prescriptions  Pending Prescriptions Disp Refills   oxybutynin (DITROPAN) 5 MG tablet [Pharmacy Med Name: OXYBUTYNIN 5MG  TABLETS] 90 tablet 3    Sig: TAKE 1 TABLET BY MOUTH EVERY DAY     Urology:  Bladder Agents Passed - 01/01/2022  3:34 AM      Passed - Valid encounter within last 12 months    Recent Outpatient Visits          2 months ago Annual physical exam   [facility] [name], MD   1 year ago Annual physical exam   [facility] [name], MD   1 year ago Dyspepsia   [facility] [name], MD   2 years ago Annual physical exam   [facility] [name], MD   2 years ago Thyroid nodule   [facility] [name], MD      Future Appointments            In 9 months [name] [name], MD [facility], [name]

## 2022-09-05 DIAGNOSIS — E041 Nontoxic single thyroid nodule: Secondary | ICD-10-CM | POA: Diagnosis not present

## 2022-09-07 DIAGNOSIS — L298 Other pruritus: Secondary | ICD-10-CM | POA: Diagnosis not present

## 2022-09-07 DIAGNOSIS — L821 Other seborrheic keratosis: Secondary | ICD-10-CM | POA: Diagnosis not present

## 2022-09-07 DIAGNOSIS — L570 Actinic keratosis: Secondary | ICD-10-CM | POA: Diagnosis not present

## 2022-09-12 DIAGNOSIS — E041 Nontoxic single thyroid nodule: Secondary | ICD-10-CM | POA: Diagnosis not present

## 2022-09-13 ENCOUNTER — Ambulatory Visit: Payer: Self-pay

## 2022-09-13 NOTE — Progress Notes (Unsigned)
Subjective:   Krista Peters is a 76 y.o. female who presents for Medicare Annual (Subsequent) preventive examination.  I connected with  Krista Peters on 09/14/22 by an in person visit nd verified that I am speaking with the correct person using two identifiers.  Patient Location: Other:  office   Provider Location: Office/Clinic  I discussed the limitations of evaluation and management by telemedicine. The patient expressed understanding and agreed to proceed.   Review of Systems    Defer to PCP Cardiac Risk Factors include: advanced age (>61mn, >>52women)     Objective:    Today's Vitals   09/14/22 0834 09/14/22 0838  BP: (!) 148/86   Pulse: (!) 101   Weight: 138 lb (62.6 kg)   Height: '5\' 6"'$  (1.676 m)   PainSc: 0-No pain 0-No pain   Body mass index is 22.27 kg/m.     09/14/2022    8:41 AM 09/13/2021   10:23 AM 09/09/2020   10:09 AM 09/09/2019   10:18 AM 02/27/2019   12:42 PM 09/03/2018   10:28 AM 11/06/2017    7:34 AM  Advanced Directives  Does Patient Have a Medical Advance Directive? Yes Yes Yes Yes Yes Yes Yes  Type of ACorporate treasurerof ABatchtownLiving will HBullheadLiving will HRiverview ParkLiving will HClaraLiving will HSandovalLiving will [place] Does patient want to make changes to medical advance directive? No - Patient declined    No - Patient declined  No - Patient declined  Copy of HBee[place]woodin Chart?  Yes - validated most recent copy scanned in chart (See row information) Yes - validated most recent copy scanned in chart (See row information) Yes - validated most recent copy scanned in chart (See row information) Yes - validated most recent copy scanned in chart (See row information) Yes Yes    Current Medications (verified) Outpatient Encounter Medications as of 09/14/2022  Medication Sig   buPROPion (WELLBUTRIN XL)  150 MG 24 hr tablet TAKE 1 TABLET(150 MG) BY MOUTH DAILY   hydrochlorothiazide (HYDRODIURIL) 25 MG tablet Take 1 tablet (25 mg total) by mouth daily.   levothyroxine (SYNTHROID) 25 MCG tablet    oxybutynin (DITROPAN) 5 MG tablet Take 1 tablet (5 mg total) by mouth daily.   [DISCONTINUED] Multiple Vitamins-Minerals (WOMENS MULTIVITAMIN PO) Take by mouth. Plant based   No facility-administered encounter medications on file as of 09/14/2022.    Allergies (verified) Lipitor [atorvastatin], Naproxen, Penicillins, and Pneumovax 23 [pneumococcal vac polyvalent]   History: Past Medical History:  Diagnosis Date   Depression    Hearing aid worn    bilateral   Thyroid disease    Past Surgical History:  Procedure Laterality Date   APPENDECTOMY     BIOPSY THYROID  2020   BUNIONECTOMY Left 02/27/2019   Procedure: DOUBLE OSTECTOMY;  Surgeon: FSamara Deist DPM;  [facility]  Service: Podiatry;  Laterality: Left;  GENERAL WITH LOCAL   COLONOSCOPY  2015   Large adenoma removed laparoscopically   COLONOSCOPY WITH PROPOFOL N/A 11/06/2017   Procedure: COLONOSCOPY WITH PROPOFOL;  Surgeon: WLucilla Lame MD;  [facility] [facility]  Service: Endoscopy;  Laterality: N/A;   LAPAROSCOPIC ILEOCECECTOMY  2015   Serrated adenoma   TONSILLECTOMY     Family History  Problem Relation Age of Onset   Diabetes Mother    Diabetes Brother    CAD  Father    CAD Brother    Social History   Socioeconomic History   Marital status: Married    Spouse name: Not on file   Number of children: 4   Years of education: Not on file   Highest education level: 12th grade  Occupational History   Occupation: Retired  Tobacco Use   Smoking status: Former    Packs/day: 0.25    Years: 10.00    Total pack years: 2.50    Types: Cigarettes    Quit date: 1970    Years since quitting: 53.7   Smokeless tobacco: Never   Tobacco comments:    smoking cessation materials not required  Vaping Use    Vaping Use: Never used  Substance and Sexual Activity   Alcohol use: Yes    Comment: occasionally   Drug use: Never   Sexual activity: Not Currently  Other Topics Concern   Not on file  Social History Narrative   Not on file   Social Determinants of Health   Financial Resource Strain: Low Risk  (09/14/2022)   Overall Financial Resource Strain (CARDIA)    Difficulty of Paying Living Expenses: Not hard at all  Food Insecurity: No Food Insecurity (09/14/2022)   Hunger Vital Sign    Worried About Running Out of Food in the Last Year: Never true    [place] in the Last Year: Never true  Transportation Needs: No Transportation Needs (09/14/2022)   PRAPARE - Hydrologist (Medical): No    Lack of Transportation (Non-Medical): No  Physical Activity: Sufficiently Active (09/14/2022)   Exercise Vital Sign    Days of Exercise per Week: 3 days    Minutes of Exercise per Session: 60 min  Stress: No Stress Concern Present (09/14/2022)   [place]    Feeling of Stress : Only a little  Social Connections: Moderately Integrated (09/14/2022)   Social Connection and Isolation Panel [NHANES]    Frequency of Communication with Friends and Family: More than three times a week    Frequency of Social Gatherings with Friends and Family: Once a week    Attends Religious Services: Never    Marine scientist or Organizations: Yes    Attends Music therapist: More than 4 times per year    Marital Status: Married    Tobacco Counseling Counseling given: Not Answered Tobacco comments: smoking cessation materials not required   Clinical Intake:  Pre-visit preparation completed: Yes  Pain : No/denies pain Pain Score: 0-No pain     BMI - recorded: 22.27 Nutritional Status: BMI of 19-24  Normal Nutritional Risks: None Diabetes: No  How often do you need to have someone help you  when you read instructions, pamphlets, or other written materials from your doctor or pharmacy?: 1 - Never  Diabetic? No.      Information entered by :: Krista Peters, Krista Peters   Activities of Daily Living    09/14/2022    8:41 AM 10/25/2021    8:06 AM  In your present state of health, do you have any difficulty performing the following activities:  Hearing? 1 1  Vision? 0 1  Difficulty concentrating or making decisions? 0 0  Walking or climbing stairs? 0 0  Dressing or bathing? 0 0  Doing errands, shopping? 0 0  Preparing Food and eating ? N   Using the Toilet? N   In the past  six months, have you accidently leaked urine? Y   Do you have problems with loss of bowel control? N   Managing your Medications? N   Managing your Finances? N   Housekeeping or managing your Housekeeping? N     Patient Care Team: [name], MD as PCP - General (Internal Medicine) [facility] as Consulting Physician (Dermatology)  Indicate any recent Medical Services you may have received from other than [facility] providers in the past year (date may be approximate).     Assessment:   This is a routine wellness examination for Krista Peters.  Hearing/Vision screen Hearing Screening - Comments:: Hearing aids in both ears. Vision Screening - Comments:: Prescription Glasses.  Dietary issues and exercise activities discussed:     Goals Addressed   None   Depression Screen    09/14/2022    8:09 AM 10/25/2021    8:05 AM 09/13/2021   10:21 AM 10/21/2020    8:26 AM 09/09/2020   10:08 AM 03/23/2020    3:05 PM 10/07/2019    1:55 PM  PHQ 2/9 Scores  PHQ - 2 Score 0 0 1 1 0 0 0  PHQ- 9 Score 1 0 3 1  0     Fall Risk    09/14/2022    8:09 AM 10/25/2021    8:06 AM 09/13/2021   10:24 AM 10/21/2020    8:26 AM 09/09/2020   10:10 AM  Fall Risk   Falls in the past year? 1 0 0 0 0  Number falls in past yr: 0 0 0  0  Injury with Fall? 0 0 0  0  Risk for fall due to : History of fall(s) No  Fall Risks No Fall Risks  No Fall Risks  Follow up Falls evaluation completed Falls evaluation completed Falls prevention discussed Falls evaluation completed Falls prevention discussed    FALL RISK PREVENTION PERTAINING TO THE HOME:  Any stairs in or around the home? No  If so, are there any without handrails?  N/A Home free of loose throw rugs in walkways, pet beds, electrical cords, etc? Yes  Adequate lighting in your home to reduce risk of falls? Yes   ASSISTIVE DEVICES UTILIZED TO PREVENT FALLS:  Life alert? No  Use of a cane, walker or w/c? No  Grab bars in the bathroom? Yes  Shower chair or bench in shower? No  Elevated toilet seat or a handicapped toilet? Yes   TIMED UP AND GO:  Was the test performed? Yes .   Gait steady and fast without use of assistive device  Cognitive Function:        09/14/2022    8:42 AM 09/09/2019   10:21 AM 09/03/2018   10:30 AM 08/31/2017    9:52 AM  6CIT Screen  What Year? 0 points 0 points 0 points 0 points  What month? 0 points 0 points 0 points 0 points  What time? 0 points 0 points 0 points 0 points  Count back from 20 0 points 0 points 0 points 0 points  Months in reverse 0 points 0 points 0 points 0 points  Repeat phrase 0 points 0 points 0 points 0 points  Total Score 0 points 0 points 0 points 0 points    Immunizations Immunization History  Administered Date(s) Administered   Fluad Quad(high Dose 65+) 09/10/2019, 09/14/2022   Influenza, High Dose Seasonal PF 09/25/2013, 10/30/2018   Influenza,inj,Quad PF,6+ Mos 09/11/2015, 09/20/2016, 08/31/2017   Influenza-Unspecified 10/30/2018, 10/15/2020, 10/01/2021  Moderna Covid-19 Vaccine Bivalent Booster 21yr & up 10/01/2021   Moderna SARS-COV2 Booster Vaccination 11/03/2020, 04/12/2021   Moderna Sars-Covid-2 Vaccination 01/30/2020, 02/27/2020   Pneumococcal Polysaccharide-23 12/28/2011, 11/18/2013   Tdap 12/27/2010   Zoster Recombinat (Shingrix) 06/09/2018, 08/28/2018   Zoster,  Live 08/02/2012    TDAP status: Due, Education has been provided regarding the importance of this vaccine. Advised may receive this vaccine at local pharmacy or Health Dept. Aware to provide a copy of the vaccination record if obtained from local pharmacy or Health Dept. Verbalized acceptance and understanding.  Flu Vaccine status: Completed at today's visit  Pneumococcal vaccine status: Up to date  Covid-19 vaccine status: Completed vaccines  Qualifies for Shingles Vaccine? Yes   Zostavax completed No   Shingrix Completed?: Yes  Screening Tests Health Maintenance  Topic Date Due   TETANUS/TDAP  12/27/2020   COVID-19 Vaccine (4 - Moderna series) 02/01/2022   Pneumonia Vaccine 75 Years old (2 - PCV) 10/25/2022 (Originally 11/18/2014)   MAMMOGRAM  11/02/2022   COLONOSCOPY (Pts 45-463yrInsurance coverage will need to be confirmed)  11/06/2022   INFLUENZA VACCINE  Completed   DEXA SCAN  Completed   Zoster Vaccines- Shingrix  Completed   Hepatitis C Screening  Addressed   HPV VACCINES  Aged Out    Health Maintenance  Health Maintenance Due  Topic Date Due   TETANUS/TDAP  12/27/2020   COVID-19 Vaccine (4 - Moderna series) 02/01/2022    Colorectal cancer screening: Type of screening: Colonoscopy. Completed 11/06/2017. Repeat every 5 years  Mammogram status: Completed 11/02/2021. Repeat every year  Bone Density status: Completed 10/11/2018. Results reflect: Bone density results: OSTEOPENIA. Repeat every 3 years.  Lung Cancer Screening: (Low Dose CT Chest recommended if Age 75-80ears, 30 pack-year currently smoking OR have quit w/in 15years.) does not qualify.   Lung Cancer Screening Referral: N/A  Additional Screening:  Hepatitis C Screening: does qualify; Completed 07/07/2016  Vision Screening: Recommended annual ophthalmology exams for early detection of glaucoma and other disorders of the eye. Is the patient up to date with their annual eye exam?  Yes  Who is the  provider or what is the name of the office in which the patient attends annual eye exams? [facility], would they like to be referred to a provider to establish care?  N/A .   Dental Screening: Recommended annual dental exams for proper oral hygiene  Community Resource Referral / Chronic Care Management: CRR required this visit?  No   CCM required this visit?  No      Plan:     I have personally reviewed and noted the following in the patient's chart:   Medical and social history Use of alcohol, tobacco or illicit drugs  Current medications and supplements including opioid prescriptions. Patient is not currently taking opioid prescriptions. Functional ability and status Nutritional status Physical activity Advanced directives List of other physicians Hospitalizations, surgeries, and ER visits in previous 12 months Vitals Screenings to include cognitive, depression, and falls Referrals and appointments  In addition, I have reviewed and discussed with patient certain preventive protocols, quality metrics, and best practice recommendations. A written personalized care plan for preventive services as well as general preventive health recommendations were provided to patient.     Krista BernhardtCMMaryville 09/14/2022     Krista Peters , Thank you for taking time to come for your Medicare Wellness Visit. I appreciate your ongoing commitment  to your health goals. Please review the following plan we discussed and let me know if I can assist you in the future.   These are the goals we discussed:  Goals      DIET - INCREASE WATER INTAKE     Recommend to drink at least 6-8 8oz glasses of water per day.        This is a list of the screening recommended for you and due dates:  Health Maintenance  Topic Date Due   Tetanus Vaccine  12/27/2020   COVID-19 Vaccine (4 - Moderna series) 02/01/2022   Pneumonia Vaccine (2 - PCV)  10/25/2022*   Mammogram  11/02/2022   Colon Cancer Screening  11/06/2022   Flu Shot  Completed   DEXA scan (bone density measurement)  Completed   Zoster (Shingles) Vaccine  Completed   Hepatitis C Screening: USPSTF Recommendation to screen - Ages 52-79 yo.  Addressed   HPV Vaccine  Aged Out  *Topic was postponed. The date shown is not the original due date.      Krista Peters , Thank you for taking time to come for your Medicare Wellness Visit. I appreciate your ongoing commitment to your health goals. Please review the following plan we discussed and let me know if I can assist you in the future.   These are the goals we discussed:  Goals      DIET - INCREASE WATER INTAKE     Recommend to drink at least 6-8 8oz glasses of water per day.        This is a list of the screening recommended for you and due dates:  Health Maintenance  Topic Date Due   Tetanus Vaccine  12/27/2020   COVID-19 Vaccine (4 - Moderna series) 02/01/2022   Pneumonia Vaccine (2 - PCV) 10/25/2022*   Mammogram  11/02/2022   Colon Cancer Screening  11/06/2022   Flu Shot  Completed   DEXA scan (bone density measurement)  Completed   Zoster (Shingles) Vaccine  Completed   Hepatitis C Screening: USPSTF Recommendation to screen - Ages 67-79 yo.  Addressed   HPV Vaccine  Aged Out  *Topic was postponed. The date shown is not the original due date.      Nurse Notes:  None.

## 2022-09-13 NOTE — Telephone Encounter (Signed)
Noted  KP 

## 2022-09-13 NOTE — Telephone Encounter (Signed)
  Chief Complaint: High BP readings - slight HA, fatigue Symptoms:  Frequency: today Pertinent Negatives: Patient denies dizziness Disposition: '[]'$ ED /'[]'$ Urgent Care (no appt availability in office) / '[x]'$ Appointment(In office/virtual)/ '[]'$  [facility]/ '[]'$ Home Care/ '[]'$ Refused Recommended Disposition /'[]'$ [facility]/ '[]'$  Follow-up with PCP Additional Notes: PT went to dentist today for procedure. BP at dentist office was 184/ ? , [place] was retaken a few other times and found to be 170/94. Dentist refused to treat pt, and wants medical clearance before dentist will treat. PT states she has a bot of a HA, but thinks it may be dehydration. Pt also states she has felt a little off lately.   Reason for Disposition  Systolic BP  >= 110 OR Diastolic >= 315  Answer Assessment - Initial Assessment Questions 1. BLOOD PRESSURE: "What is the blood pressure?" "Did you take at least two measurements 5 minutes apart?"     170/94 2. ONSET: "When did you take your blood pressure?"     At dentist office 3. HOW: "How did you take your blood pressure?" (e.g., automatic home BP monitor, visiting nurse)     Dentist office 4. HISTORY: "Do you have a history of high blood pressure?"     no 5. MEDICINES: "Are you taking any medicines for blood pressure?" "Have you missed any doses recently?"     no 6. OTHER SYMPTOMS: "Do you have any symptoms?" (e.g., blurred vision, chest pain, difficulty breathing, headache, weakness)     HA 7. PREGNANCY: "Is there any chance you are pregnant?" "When was your last menstrual period?" NA  Protocols used: Blood Pressure - High-A-AH

## 2022-09-14 ENCOUNTER — Ambulatory Visit (INDEPENDENT_AMBULATORY_CARE_PROVIDER_SITE_OTHER): Payer: Medicare Other | Admitting: Internal Medicine

## 2022-09-14 ENCOUNTER — Ambulatory Visit (INDEPENDENT_AMBULATORY_CARE_PROVIDER_SITE_OTHER): Payer: Medicare Other

## 2022-09-14 ENCOUNTER — Encounter: Payer: Self-pay | Admitting: Internal Medicine

## 2022-09-14 VITALS — BP 148/86 | HR 101 | Ht 66.0 in | Wt 138.0 lb

## 2022-09-14 DIAGNOSIS — Z23 Encounter for immunization: Secondary | ICD-10-CM | POA: Diagnosis not present

## 2022-09-14 DIAGNOSIS — I10 Essential (primary) hypertension: Secondary | ICD-10-CM | POA: Diagnosis not present

## 2022-09-14 DIAGNOSIS — Z0000 Encounter for general adult medical examination without abnormal findings: Secondary | ICD-10-CM

## 2022-09-14 MED ORDER — HYDROCHLOROTHIAZIDE 25 MG PO TABS: 25.0000 mg | ORAL_TABLET | Freq: Every day | ORAL | 1 refills | Status: DC

## 2022-09-14 NOTE — Progress Notes (Signed)
Date:  09/14/2022   Name:  Krista Peters   DOB:  1946-07-04   MRN:  [id number]   Chief Complaint: Hypertension (170/94 at dentist yesterday )  Hypertension This is a new problem. The current episode started in the past 7 days (new problem - noted at the dentist). Associated symptoms include headaches. Pertinent negatives include no blurred vision, palpitations, peripheral edema or shortness of breath. There are no associated agents to hypertension. Past treatments include nothing.    Lab Results  Component Value Date   NA 138 10/25/2021   K 4.9 10/25/2021   CO2 27 10/25/2021   GLUCOSE 93 10/25/2021   BUN 15 10/25/2021   CREATININE 0.77 10/25/2021   CALCIUM 9.4 10/25/2021   EGFR 81 10/25/2021   GFRNONAA 78 10/21/2020   Lab Results  Component Value Date   CHOL 205 (H) 10/25/2021   HDL 65 10/25/2021   LDLCALC 123 (H) 10/25/2021   TRIG 95 10/25/2021   CHOLHDL 3.2 10/25/2021   Lab Results  Component Value Date   TSH 1.870 10/21/2020   No results found for: "HGBA1C" Lab Results  Component Value Date   WBC 5.3 10/25/2021   HGB 13.4 10/25/2021   HCT 40.1 10/25/2021   MCV 94 10/25/2021   PLT 244 10/25/2021   Lab Results  Component Value Date   ALT 12 10/25/2021   AST 15 10/25/2021   ALKPHOS 80 10/25/2021   BILITOT 0.4 10/25/2021   Lab Results  Component Value Date   VD25OH 62.0 10/25/2021     Review of Systems  Constitutional:  Positive for fatigue (caring for husband with dementia). Negative for chills and fever.  Eyes:  Negative for blurred vision.  Respiratory:  Negative for chest tightness and shortness of breath.   Cardiovascular:  Positive for leg swelling (mild late day ankle edema resolves overnight). Negative for palpitations.  Neurological:  Positive for headaches.  Psychiatric/Behavioral:  Negative for dysphoric mood and sleep disturbance. The patient is not nervous/anxious.     Patient Active Problem List   Diagnosis Date Noted   Nontoxic  single thyroid nodule 09/16/2019   Mass of skin of left hand 04/28/2018   Osteoporosis of multiple sites without pathological fracture 10/21/2016   Urge incontinence of urine 09/11/2015   Major depression, single episode, in complete remission ([place]) 09/11/2015   Mixed hyperlipidemia 09/11/2015   Irritable bowel syndrome without diarrhea 09/11/2015   Hx of adenomatous colonic polyps 08/27/2014   Adhesive capsulitis 03/26/2014   Disequilibrium 06/13/2008    Allergies  Allergen Reactions   Lipitor [Atorvastatin] Other (See Comments)    Leg pain   Naproxen Rash   Penicillins Rash   Pneumovax 23 [Pneumococcal Vac Polyvalent] Rash    Past Surgical History:  Procedure Laterality Date   APPENDECTOMY     BIOPSY THYROID  2020   BUNIONECTOMY Left 02/27/2019   Procedure: DOUBLE OSTECTOMY;  Surgeon: Samara Deist, DPM;  Location: [place];  Service: Podiatry;  Laterality: Left;  GENERAL WITH LOCAL   COLONOSCOPY  2015   Large adenoma removed laparoscopically   COLONOSCOPY WITH PROPOFOL N/A 11/06/2017   Procedure: COLONOSCOPY WITH PROPOFOL;  Surgeon: Lucilla Lame, MD;  Location: [facility];  Service: Endoscopy;  Laterality: N/A;   LAPAROSCOPIC ILEOCECECTOMY  2015   Serrated adenoma   TONSILLECTOMY      Social History   Tobacco Use   Smoking status: Former    Packs/day: 0.25    Years: 10.00  Total pack years: 2.50    Types: Cigarettes    Quit date: 1970    Years since quitting: 53.7   Smokeless tobacco: Never   Tobacco comments:    smoking cessation materials not required  Vaping Use   Vaping Use: Never used  Substance Use Topics   Alcohol use: Yes    Comment: occasionally   Drug use: Never     Medication list has been reviewed and updated.  Current Meds  Medication Sig   buPROPion (WELLBUTRIN XL) 150 MG 24 hr tablet TAKE 1 TABLET(150 MG) BY MOUTH DAILY   levothyroxine (SYNTHROID) 25 MCG tablet    oxybutynin (DITROPAN) 5 MG tablet Take 1 tablet (5  mg total) by mouth daily.   hydrochlorothiazide (HYDRODIURIL) 25 MG tablet Take 1 tablet (25 mg total) by mouth daily.       09/14/2022    8:09 AM 10/25/2021    8:06 AM 10/21/2020    8:27 AM  GAD 7 : Generalized Anxiety Score  Nervous, Anxious, on Edge 1 0 1  Control/stop worrying 0 0 0  Worry too much - different things 1 0 0  Trouble relaxing 0 0 1  Restless 0 0 0  Easily annoyed or irritable 0 0 0  Afraid - awful might happen 0 0 0  Total GAD 7 Score 2 0 2  Anxiety Difficulty Not difficult at all Not difficult at all Not difficult at all       09/14/2022    8:09 AM 10/25/2021    8:05 AM 09/13/2021   10:21 AM  Depression screen PHQ 2/9  Decreased Interest 0 0 1  Down, Depressed, Hopeless 0 0 0  PHQ - 2 Score 0 0 1  Altered sleeping 0 0 0  Tired, decreased energy 1 0 2  Change in appetite 0 0 0  Feeling bad or failure about yourself  0 0 0  Trouble concentrating 0 0 0  Moving slowly or fidgety/restless 0 0 0  Suicidal thoughts 0 0 0  PHQ-9 Score 1 0 3  Difficult doing work/chores Not difficult at all Not difficult at all Not difficult at all    BP Readings from Last 3 Encounters:  09/14/22 (!) 148/86  10/25/21 112/78  09/13/21 130/88    Physical Exam Vitals and nursing note reviewed.  Constitutional:      General: She is not in acute distress.    Appearance: Normal appearance. She is well-developed.  HENT:     Head: Normocephalic and atraumatic.  Neck:     Vascular: No carotid bruit.  Cardiovascular:     Rate and Rhythm: Normal rate and regular rhythm.     Heart sounds: No murmur heard. Pulmonary:     Effort: Pulmonary effort is normal. No respiratory distress.     Breath sounds: No wheezing or rhonchi.  Musculoskeletal:     Cervical back: Normal range of motion.     Right lower leg: No edema.     Left lower leg: No edema.  Lymphadenopathy:     Cervical: No cervical adenopathy.  Skin:    General: Skin is warm and dry.     Capillary Refill: Capillary  refill takes less than 2 seconds.     Findings: No rash.  Neurological:     General: No focal deficit present.     Mental Status: She is alert and oriented to person, place, and time.  Psychiatric:        Mood and Affect: Mood  normal.        Behavior: Behavior normal.     Wt Readings from Last 3 Encounters:  09/14/22 138 lb (62.6 kg)  10/25/21 137 lb 12.8 oz (62.5 kg)  09/13/21 138 lb (62.6 kg)    BP (!) 148/86 (BP Location: Left Arm, Cuff Size: Normal)   Pulse (!) 101   Ht '5\' 6"'  (1.676 m)   Wt 138 lb (62.6 kg)   SpO2 98%   BMI 22.27 kg/m   Assessment and Plan: 1. Essential hypertension New onset mild increase in BP No exogenous cause noted but she is under stress caring for her husband. Will start HCTZ and recheck in 6 weeks Clearance form for dentist signed - hydrochlorothiazide (HYDRODIURIL) 25 MG tablet; Take 1 tablet (25 mg total) by mouth daily.  Dispense: 90 tablet; Refill: 1   Partially dictated using Editor, commissioning. Any errors are unintentional.  Halina Maidens, MD [facility]  09/14/2022

## 2022-10-26 ENCOUNTER — Encounter: Payer: Self-pay | Admitting: Internal Medicine

## 2022-10-26 ENCOUNTER — Ambulatory Visit (INDEPENDENT_AMBULATORY_CARE_PROVIDER_SITE_OTHER): Payer: Medicare Other | Admitting: Internal Medicine

## 2022-10-26 VITALS — BP 126/78 | HR 90 | Ht 66.0 in | Wt 137.0 lb

## 2022-10-26 DIAGNOSIS — F325 Major depressive disorder, single episode, in full remission: Secondary | ICD-10-CM

## 2022-10-26 DIAGNOSIS — E782 Mixed hyperlipidemia: Secondary | ICD-10-CM

## 2022-10-26 DIAGNOSIS — Z8249 Family history of ischemic heart disease and other diseases of the circulatory system: Secondary | ICD-10-CM

## 2022-10-26 DIAGNOSIS — I10 Essential (primary) hypertension: Secondary | ICD-10-CM | POA: Diagnosis not present

## 2022-10-26 DIAGNOSIS — Z0000 Encounter for general adult medical examination without abnormal findings: Secondary | ICD-10-CM | POA: Diagnosis not present

## 2022-10-26 DIAGNOSIS — E041 Nontoxic single thyroid nodule: Secondary | ICD-10-CM | POA: Diagnosis not present

## 2022-10-26 DIAGNOSIS — N3941 Urge incontinence: Secondary | ICD-10-CM

## 2022-10-26 DIAGNOSIS — Z1231 Encounter for screening mammogram for malignant neoplasm of breast: Secondary | ICD-10-CM

## 2022-10-26 NOTE — Progress Notes (Signed)
Date:  10/26/2022   Name:  Krista Peters   DOB:  02-19-1946   MRN:  [id number]   Chief Complaint: Annual Exam (Breast exam no pap) Krista Peters is a 76 y.o. female who presents today for her Complete Annual Exam. She feels well. She reports exercising walking 4 days a week and yoga class once a week. She reports she is sleeping well. Breast complaints none.  Mammogram: 10/2021 scheduled at [facility]: 09/2018 osteopenia Colonoscopy: 10/2017  Health Maintenance Due  Topic Date Due   TETANUS/TDAP  12/27/2020   COVID-19 Vaccine (4 - Moderna series) 02/01/2022    Immunization History  Administered Date(s) Administered   COVID-19, mRNA, vaccine(Comirnaty)12 years and older 09/16/2022   Fluad Quad(high Dose 65+) 09/10/2019, 09/14/2022   Influenza, High Dose Seasonal PF 09/25/2013, 10/30/2018   Influenza,inj,Quad PF,6+ Mos 09/11/2015, 09/20/2016, 08/31/2017   Influenza-Unspecified 10/30/2018, 10/15/2020, 10/01/2021   Moderna Covid-19 Vaccine Bivalent Booster 76yr & up 10/01/2021   Moderna SARS-COV2 Booster Vaccination 11/03/2020, 04/12/2021   Moderna Sars-Covid-2 Vaccination 01/30/2020, 02/27/2020   Pneumococcal Polysaccharide-23 12/28/2011, 11/18/2013   Tdap 12/27/2010   Zoster Recombinat (Shingrix) 06/09/2018, 08/28/2018   Zoster, Live 08/02/2012    Hypertension This is a new problem. Pertinent negatives include no chest pain, headaches, palpitations or shortness of breath. Past treatments include diuretics. Identifiable causes of hypertension include a thyroid problem.  Depression        This is a chronic problem.The problem is unchanged.  Associated symptoms include no fatigue and no headaches.  Treatments tried: bupropion.  Past medical history includes thyroid problem.   Thyroid Problem Presents for follow-up visit. Patient reports no anxiety, constipation, diarrhea, fatigue, palpitations or tremors. The symptoms have been stable.    Lab Results  Component Value Date    NA 138 10/25/2021   K 4.9 10/25/2021   CO2 27 10/25/2021   GLUCOSE 93 10/25/2021   BUN 15 10/25/2021   CREATININE 0.77 10/25/2021   CALCIUM 9.4 10/25/2021   EGFR 81 10/25/2021   GFRNONAA 78 10/21/2020   Lab Results  Component Value Date   CHOL 205 (H) 10/25/2021   HDL 65 10/25/2021   LDLCALC 123 (H) 10/25/2021   TRIG 95 10/25/2021   CHOLHDL 3.2 10/25/2021   Lab Results  Component Value Date   TSH 1.870 10/21/2020   No results found for: "HGBA1C" Lab Results  Component Value Date   WBC 5.3 10/25/2021   HGB 13.4 10/25/2021   HCT 40.1 10/25/2021   MCV 94 10/25/2021   PLT 244 10/25/2021   Lab Results  Component Value Date   ALT 12 10/25/2021   AST 15 10/25/2021   ALKPHOS 80 10/25/2021   BILITOT 0.4 10/25/2021   Lab Results  Component Value Date   VD25OH 62.0 10/25/2021     Review of Systems  Constitutional:  Negative for chills, fatigue and fever.  HENT:  Positive for hearing loss. Negative for congestion, tinnitus, trouble swallowing and voice change.   Eyes:  Negative for visual disturbance.  Respiratory:  Negative for cough, chest tightness, shortness of breath and wheezing.   Cardiovascular:  Negative for chest pain, palpitations and leg swelling.  Gastrointestinal:  Negative for abdominal pain, constipation, diarrhea and vomiting.  Endocrine: Negative for polydipsia and polyuria.  Genitourinary:  Negative for dysuria, frequency, genital sores, vaginal bleeding and vaginal discharge.  Musculoskeletal:  Negative for arthralgias, gait problem and joint swelling.  Skin:  Negative for color change and rash.  Neurological:  Negative  for dizziness, tremors, light-headedness and headaches.  Hematological:  Negative for adenopathy. Does not bruise/bleed easily.  Psychiatric/Behavioral:  Positive for depression. Negative for dysphoric mood and sleep disturbance. The patient is not nervous/anxious.     Patient Active Problem List   Diagnosis Date Noted   Nontoxic  single thyroid nodule 09/16/2019   Mass of skin of left hand 04/28/2018   Osteoporosis of multiple sites without pathological fracture 10/21/2016   Urge incontinence of urine 09/11/2015   Major depression, single episode, in complete remission ([place]) 09/11/2015   Mixed hyperlipidemia 09/11/2015   Irritable bowel syndrome without diarrhea 09/11/2015   Hx of adenomatous colonic polyps 08/27/2014   Adhesive capsulitis 03/26/2014   Disequilibrium 06/13/2008    Allergies  Allergen Reactions   Lipitor [Atorvastatin] Other (See Comments)    Leg pain   Naproxen Rash   Penicillins Rash   Pneumovax 23 [Pneumococcal Vac Polyvalent] Rash    Past Surgical History:  Procedure Laterality Date   APPENDECTOMY     BIOPSY THYROID  2020   BUNIONECTOMY Left 02/27/2019   Procedure: DOUBLE OSTECTOMY;  Surgeon: Samara Deist, DPM;  Location: [facility];  Service: Podiatry;  Laterality: Left;  GENERAL WITH LOCAL   COLONOSCOPY  2015   Large adenoma removed laparoscopically   COLONOSCOPY WITH PROPOFOL N/A 11/06/2017   Procedure: COLONOSCOPY WITH PROPOFOL;  Surgeon: Lucilla Lame, MD;  Location: [facility];  Service: Endoscopy;  Laterality: N/A;   LAPAROSCOPIC ILEOCECECTOMY  2015   Serrated adenoma   TONSILLECTOMY      Social History   Tobacco Use   Smoking status: Former    Packs/day: 0.25    Years: 10.00    Total pack years: 2.50    Types: Cigarettes    Quit date: 1970    Years since quitting: 53.8   Smokeless tobacco: Never   Tobacco comments:    smoking cessation materials not required  Vaping Use   Vaping Use: Never used  Substance Use Topics   Alcohol use: Yes    Comment: occasionally   Drug use: Never     Medication list has been reviewed and updated.  Current Meds  Medication Sig   buPROPion (WELLBUTRIN XL) 150 MG 24 hr tablet TAKE 1 TABLET(150 MG) BY MOUTH DAILY   hydrochlorothiazide (HYDRODIURIL) 25 MG tablet Take 1 tablet (25 mg total) by mouth daily.    levothyroxine (SYNTHROID) 25 MCG tablet    oxybutynin (DITROPAN) 5 MG tablet Take 1 tablet (5 mg total) by mouth daily.       10/26/2022    8:05 AM 09/14/2022    8:09 AM 10/25/2021    8:06 AM 10/21/2020    8:27 AM  GAD 7 : Generalized Anxiety Score  Nervous, Anxious, on Edge 0 1 0 1  Control/stop worrying 0 0 0 0  Worry too much - different things 0 1 0 0  Trouble relaxing 0 0 0 1  Restless 0 0 0 0  Easily annoyed or irritable 0 0 0 0  Afraid - awful might happen 0 0 0 0  Total GAD 7 Score 0 2 0 2  Anxiety Difficulty Not difficult at all Not difficult at all Not difficult at all Not difficult at all       10/26/2022    8:05 AM 09/14/2022    8:09 AM 10/25/2021    8:05 AM  Depression screen PHQ 2/9  Decreased Interest 0 0 0  Down, Depressed, Hopeless 0 0 0  PHQ -  2 Score 0 0 0  Altered sleeping 0 0 0  Tired, decreased energy 0 1 0  Change in appetite 0 0 0  Feeling bad or failure about yourself  0 0 0  Trouble concentrating 0 0 0  Moving slowly or fidgety/restless 0 0 0  Suicidal thoughts 0 0 0  PHQ-9 Score 0 1 0  Difficult doing work/chores Not difficult at all Not difficult at all Not difficult at all    BP Readings from Last 3 Encounters:  10/26/22 126/78  09/14/22 (!) 148/86  09/14/22 (!) 148/86    Physical Exam Vitals and nursing note reviewed.  Constitutional:      General: She is not in acute distress.    Appearance: She is well-developed.  HENT:     Head: Normocephalic and atraumatic.     Right Ear: Tympanic membrane and ear canal normal.     Left Ear: Tympanic membrane and ear canal normal.     Nose:     Right Sinus: No maxillary sinus tenderness.     Left Sinus: No maxillary sinus tenderness.  Eyes:     General: No scleral icterus.       Right eye: No discharge.        Left eye: No discharge.     Conjunctiva/sclera: Conjunctivae normal.  Neck:     Thyroid: No thyromegaly.     Vascular: No carotid bruit.  Cardiovascular:     Rate and Rhythm:  Normal rate and regular rhythm.     Pulses: Normal pulses.     Heart sounds: Normal heart sounds.  Pulmonary:     Effort: Pulmonary effort is normal. No respiratory distress.     Breath sounds: No wheezing.  Chest:  Breasts:    Right: No mass, nipple discharge, skin change or tenderness.     Left: No mass, nipple discharge, skin change or tenderness.  Abdominal:     General: Bowel sounds are normal.     Palpations: Abdomen is soft.     Tenderness: There is no abdominal tenderness.  Musculoskeletal:     Cervical back: Normal range of motion. No erythema.     Right lower leg: No edema.     Left lower leg: No edema.  Lymphadenopathy:     Cervical: No cervical adenopathy.  Skin:    General: Skin is warm and dry.     Findings: No rash.  Neurological:     Mental Status: She is alert and oriented to person, place, and time.     Cranial Nerves: No cranial nerve deficit.     Sensory: No sensory deficit.     Deep Tendon Reflexes: Reflexes are normal and symmetric.  Psychiatric:        Attention and Perception: Attention normal.        Mood and Affect: Mood normal.     Wt Readings from Last 3 Encounters:  10/26/22 137 lb (62.1 kg)  09/14/22 138 lb (62.6 kg)  09/14/22 138 lb (62.6 kg)    BP 126/78   Pulse 90   Ht _0  (1.676 m)   Wt 137 lb (62.1 kg)   SpO2 94%   BMI 22.11 kg/m   Assessment and Plan: 1. Annual physical exam Normal exam  2. Encounter for screening mammogram for breast cancer Scheduled at [facility]  3. Essential hypertension Clinically stable exam with well controlled BP. Tolerating medications without side effects at this time. Pt to continue current regimen and low sodium diet; benefits  of regular exercise as able discussed. Pt has family hx CM and would like an ECHO - CBC with Differential/Platelet - Comprehensive metabolic panel - POCT urinalysis dipstick - ECHOCARDIOGRAM COMPLETE  4. Mixed hyperlipidemia Check labs Continue healthy diet - Lipid  panel  5. Major depression, single episode, in complete remission ([place]) Clinically stable on current regimen with good control of symptoms, No SI or HI. Will continue current therapy.  6. Nontoxic single thyroid nodule Followed by Dr. Kathyrn Sheriff.  7. Urge incontinence of urine Doing well on Ditropan No s/s of infection  8. Family history of cardiomyopathy Father, brother, paternal uncle - ECHOCARDIOGRAM COMPLETE   Partially dictated using Editor, commissioning. Any errors are unintentional.  Halina Maidens, MD [facility]  10/26/2022

## 2022-10-27 ENCOUNTER — Other Ambulatory Visit: Payer: Self-pay | Admitting: Internal Medicine

## 2022-10-27 DIAGNOSIS — E782 Mixed hyperlipidemia: Secondary | ICD-10-CM

## 2022-10-27 LAB — LIPID PANEL
Chol/HDL Ratio: 3.1 ratio (ref 0.0–4.4)
Cholesterol, Total: 221 mg/dL — ABNORMAL HIGH (ref 100–199)
HDL: 71 mg/dL (ref >39)
LDL Chol Calc (NIH): 136 mg/dL — ABNORMAL HIGH (ref 0–99)
Triglycerides: 81 mg/dL (ref 0–149)
VLDL Cholesterol Cal: 14 mg/dL (ref 5–40)

## 2022-10-27 LAB — COMPREHENSIVE METABOLIC PANEL
ALT: 13 IU/L (ref 0–32)
AST: 17 IU/L (ref 0–40)
Albumin/Globulin Ratio: 1.7 (ref 1.2–2.2)
Albumin: 4.4 g/dL (ref 3.8–4.8)
Alkaline Phosphatase: 91 IU/L (ref 44–121)
BUN/Creatinine Ratio: 16 (ref 12–28)
BUN: 12 mg/dL (ref 8–27)
Bilirubin Total: 0.4 mg/dL (ref 0.0–1.2)
CO2: 28 mmol/L (ref 20–29)
Calcium: 9.4 mg/dL (ref 8.7–10.3)
Chloride: 96 mmol/L (ref 96–106)
Creatinine, Ser: 0.75 mg/dL (ref 0.57–1.00)
Globulin, Total: 2.6 g/dL (ref 1.5–4.5)
Glucose: 106 mg/dL — ABNORMAL HIGH (ref 70–99)
Potassium: 4.3 mmol/L (ref 3.5–5.2)
Sodium: 137 mmol/L (ref 134–144)
Total Protein: 7 g/dL (ref 6.0–8.5)
eGFR: 83 mL/min/{1.73_m2} (ref >59)

## 2022-10-27 LAB — CBC WITH DIFFERENTIAL/PLATELET
Basophils Absolute: 0.1 10*3/uL (ref 0.0–0.2)
Basos: 1 %
EOS (ABSOLUTE): 0.3 10*3/uL (ref 0.0–0.4)
Eos: 5 %
Hematocrit: 41.9 % (ref 34.0–46.6)
Hemoglobin: 14.2 g/dL (ref 11.1–15.9)
Immature Grans (Abs): 0 10*3/uL (ref 0.0–0.1)
Immature Granulocytes: 0 %
Lymphocytes Absolute: 1.5 10*3/uL (ref 0.7–3.1)
Lymphs: 29 %
MCH: 31.3 pg (ref 26.6–33.0)
MCHC: 33.9 g/dL (ref 31.5–35.7)
MCV: 93 fL (ref 79–97)
Monocytes Absolute: 0.5 10*3/uL (ref 0.1–0.9)
Monocytes: 9 %
Neutrophils Absolute: 2.9 10*3/uL (ref 1.4–7.0)
Neutrophils: 56 %
Platelets: 315 10*3/uL (ref 150–450)
RBC: 4.53 x10E6/uL (ref 3.77–5.28)
RDW: 11.9 % (ref 11.7–15.4)
WBC: 5.3 10*3/uL (ref 3.4–10.8)

## 2022-10-27 MED ORDER — PRAVASTATIN SODIUM 10 MG PO TABS: 10.0000 mg | ORAL_TABLET | Freq: Every day | ORAL | 1 refills | Status: DC

## 2022-10-27 NOTE — Progress Notes (Signed)
Pt informed and verbalized understanding. Pt stated she tried Lipitor and she had side effects but is willing to try another statin medication. Pt wants it sent to [facility] in [place]. Pt asked abut referral for Echo. I don't see a referral for cardiology placed.  KP

## 2022-10-27 NOTE — Progress Notes (Signed)
Pt informed of Echo and statin medication. Pt verbalized understanding.  KP

## 2022-11-07 ENCOUNTER — Encounter: Payer: Self-pay | Admitting: Internal Medicine

## 2022-11-15 DIAGNOSIS — Z86018 Personal history of other benign neoplasm: Secondary | ICD-10-CM | POA: Diagnosis not present

## 2022-11-15 DIAGNOSIS — L578 Other skin changes due to chronic exposure to nonionizing radiation: Secondary | ICD-10-CM | POA: Diagnosis not present

## 2022-11-15 DIAGNOSIS — L570 Actinic keratosis: Secondary | ICD-10-CM | POA: Diagnosis not present

## 2022-11-15 DIAGNOSIS — Z859 Personal history of malignant neoplasm, unspecified: Secondary | ICD-10-CM | POA: Diagnosis not present

## 2022-11-15 DIAGNOSIS — Z872 Personal history of diseases of the skin and subcutaneous tissue: Secondary | ICD-10-CM | POA: Diagnosis not present

## 2022-11-24 ENCOUNTER — Telehealth: Payer: Self-pay | Admitting: Internal Medicine

## 2022-11-24 NOTE — Telephone Encounter (Signed)
Copied from [place] [phone]. Topic: Referral - Status >> Nov 24, 2022 10:56 AM Cyndi Bender wrote: Reason for CRM: Pt stated she was referred to have an echocardiogram done but when she called the number that she was given no one answers. Pt requests call back to assist with getting an appt for the echocardiogram

## 2022-11-24 NOTE — Telephone Encounter (Signed)
Spoke to pt gave her the number to call and schedule. [phone]. Pt verbalized understanding.  KP

## 2022-12-02 DIAGNOSIS — H353132 Nonexudative age-related macular degeneration, bilateral, intermediate dry stage: Secondary | ICD-10-CM | POA: Diagnosis not present

## 2022-12-06 ENCOUNTER — Ambulatory Visit
Admission: RE | Admit: 2022-12-06 | Discharge: 2022-12-06 | Disposition: A | Discharge status: Home / Self Care | Payer: Medicare Other | Source: Ambulatory Visit | Attending: Internal Medicine | Admitting: Internal Medicine

## 2022-12-06 DIAGNOSIS — I081 Rheumatic disorders of both mitral and tricuspid valves: Secondary | ICD-10-CM | POA: Diagnosis not present

## 2022-12-06 DIAGNOSIS — I429 Cardiomyopathy, unspecified: Secondary | ICD-10-CM | POA: Insufficient documentation

## 2022-12-06 DIAGNOSIS — I10 Essential (primary) hypertension: Secondary | ICD-10-CM | POA: Diagnosis not present

## 2022-12-06 DIAGNOSIS — Z8249 Family history of ischemic heart disease and other diseases of the circulatory system: Secondary | ICD-10-CM | POA: Diagnosis not present

## 2022-12-06 DIAGNOSIS — I428 Other cardiomyopathies: Secondary | ICD-10-CM | POA: Diagnosis not present

## 2022-12-06 LAB — ECHOCARDIOGRAM COMPLETE
AR max vel: 1.65 cm2
AV Area VTI: 1.88 cm2
AV Area mean vel: 1.8 cm2
AV Mean grad: 3 mmHg
AV Peak grad: 4.9 mmHg
Ao pk vel: 1.11 m/s
Area-P 1/2: 5.23 cm2
Calc EF: 47.5 %
MV M vel: 6.34 m/s
MV Peak grad: 160.8 mmHg
S' Lateral: 3.1 cm
Single Plane A2C EF: 48.6 %
Single Plane A4C EF: 48.4 %

## 2022-12-06 NOTE — Progress Notes (Signed)
*  PRELIMINARY RESULTS* Echocardiogram 2D Echocardiogram has been performed.  Krista Peters 12/06/2022, 9:39 AM

## 2022-12-12 ENCOUNTER — Other Ambulatory Visit: Payer: Self-pay

## 2022-12-12 ENCOUNTER — Telehealth: Payer: Self-pay | Admitting: Internal Medicine

## 2022-12-12 DIAGNOSIS — I38 Endocarditis, valve unspecified: Secondary | ICD-10-CM

## 2022-12-12 NOTE — Telephone Encounter (Signed)
Duplicate   KP 

## 2022-12-12 NOTE — Telephone Encounter (Signed)
Called pt with results.  KP

## 2022-12-12 NOTE — Telephone Encounter (Signed)
Call patient to let her know the ECHO findings.  She has mild decrease in heart pump function.  Both the mitral and tricuspid valves are slightly leaky.  Other findings are normal.  Because of family history, I recommend a cardiology consultation - this is not urgent.

## 2022-12-12 NOTE — Telephone Encounter (Signed)
Copied from [place] [phone]. Topic: General - Other >> Dec 12, 2022  1:37 PM Oley Balm E wrote: Reason for CRM: Pt called wanting to discuss her echocardiogram results with the clinic, please advise when provider notes are available.

## 2022-12-12 NOTE — Telephone Encounter (Signed)
Copied from [place] [phone]. Topic: General - Other >> Dec 12, 2022  1:53 PM Leitha Schuller wrote: Pt requesting a cb for echo cardiogram results  Please assist further

## 2022-12-12 NOTE — Telephone Encounter (Signed)
Pt informed and verbalized understanding.  KP 

## 2022-12-12 NOTE — Telephone Encounter (Signed)
Copied from [place] [id number]. Topic: General - Other >> Dec 12, 2022  1:37 PM Oley Balm E wrote: Reason for CRM: Pt called wanting to discuss her echocardiogram results with the clinic, please advise when provider notes are available.

## 2022-12-20 DIAGNOSIS — H2511 Age-related nuclear cataract, right eye: Secondary | ICD-10-CM | POA: Diagnosis not present

## 2022-12-21 ENCOUNTER — Encounter: Payer: Self-pay | Admitting: Ophthalmology

## 2022-12-21 ENCOUNTER — Other Ambulatory Visit: Payer: Self-pay | Admitting: Internal Medicine

## 2022-12-21 DIAGNOSIS — F325 Major depressive disorder, single episode, in full remission: Secondary | ICD-10-CM

## 2022-12-30 ENCOUNTER — Ambulatory Visit: Payer: Medicare HMO | Attending: Cardiology | Admitting: Cardiology

## 2022-12-30 ENCOUNTER — Other Ambulatory Visit
Admission: RE | Admit: 2022-12-30 | Discharge: 2022-12-30 | Disposition: A | Discharge status: Home / Self Care | Payer: Medicare HMO | Attending: Cardiology | Admitting: Cardiology

## 2022-12-30 ENCOUNTER — Encounter: Payer: Self-pay | Admitting: Cardiology

## 2022-12-30 VITALS — BP 140/90 | HR 76 | Ht 66.0 in | Wt 138.1 lb

## 2022-12-30 DIAGNOSIS — I10 Essential (primary) hypertension: Secondary | ICD-10-CM | POA: Diagnosis not present

## 2022-12-30 DIAGNOSIS — I340 Nonrheumatic mitral (valve) insufficiency: Secondary | ICD-10-CM | POA: Diagnosis not present

## 2022-12-30 DIAGNOSIS — Z01810 Encounter for preprocedural cardiovascular examination: Secondary | ICD-10-CM

## 2022-12-30 DIAGNOSIS — R9430 Abnormal result of cardiovascular function study, unspecified: Secondary | ICD-10-CM | POA: Insufficient documentation

## 2022-12-30 LAB — BASIC METABOLIC PANEL
Anion gap: 7 (ref 5–15)
BUN: 15 mg/dL (ref 8–23)
CO2: 29 mmol/L (ref 22–32)
Calcium: 8.8 mg/dL — ABNORMAL LOW (ref 8.9–10.3)
Chloride: 96 mmol/L — ABNORMAL LOW (ref 98–111)
Creatinine, Ser: 0.63 mg/dL (ref 0.44–1.00)
GFR, Estimated: >60 mL/min (ref >60)
Glucose, Bld: 85 mg/dL (ref 70–99)
Potassium: 3.8 mmol/L (ref 3.5–5.1)
Sodium: 132 mmol/L — ABNORMAL LOW (ref 135–145)

## 2022-12-30 MED ORDER — LOSARTAN POTASSIUM 25 MG PO TABS: 25.0000 mg | ORAL_TABLET | Freq: Every day | ORAL | 3 refills | Status: DC

## 2022-12-30 MED ORDER — METOPROLOL SUCCINATE ER 25 MG PO TB24: 25.0000 mg | ORAL_TABLET | Freq: Every day | ORAL | 3 refills | Status: DC

## 2022-12-30 MED ORDER — METOPROLOL TARTRATE 100 MG PO TABS: ORAL_TABLET | ORAL | 0 refills | Status: DC

## 2022-12-30 NOTE — Patient Instructions (Signed)
Medication Instructions:   STOP Hydrochlorothiazide  START Losartan - take one tablet ('25mg'$ ) by mouth daily.  START Toprol XL - take one tablet ('25mg'$ ) by mouth daily.   *If you need a refill on your cardiac medications before your next appointment, please call your pharmacy*   Lab Work:  Your physician recommends you go to the medical mall to have lab work completed - BMP  If you have labs (blood work) drawn today and your tests are completely normal, you will receive your results only by: MyChart Message (if you have MyChart) OR A paper copy in the mail If you have any lab test that is abnormal or we need to change your treatment, we will call you to review the results.   Testing/Procedures:    Your cardiac CT will be scheduled at [facility] [address] [place], [place] [address] [phone]  please arrive 15 mins early for check-in and test prep.   On the Night Before the Test: Be sure to Drink plenty of water. Do not consume any caffeinated/decaffeinated beverages or chocolate 12 hours prior to your test. Do not take any antihistamines 12 hours prior to your test.  On the Day of the Test: Drink plenty of water until 1 hour prior to the test. Do not eat any food 1 hour prior to test. You may take your regular medications prior to the test.  Take metoprolol (Lopressor) '100mg'$  two hours prior to test. FEMALES- please wear underwire-free bra if available, avoid dresses & tight clothing      After the Test: Drink plenty of water. After receiving IV contrast, you may experience a mild flushed feeling. This is normal. On occasion, you may experience a mild rash up to 24 hours after the test. This is not dangerous. If this occurs, you can take Benadryl 25 mg and increase your fluid intake. If you experience trouble breathing, this can be serious. If it is severe call 911 IMMEDIATELY. If it is mild, please call our  office.  We will call to schedule your test 2-4 weeks out understanding that some insurance companies will need an authorization prior to the service being performed.   For non-scheduling related questions, please contact the cardiac imaging nurse navigator should you have any questions/concerns: Marchia Bond, Cardiac Imaging Nurse Navigator Gordy Clement, Cardiac Imaging Nurse Navigator [facility] Direct Office Dial: [phone]   For scheduling needs, including cancellations and rescheduling, please call [place], [phone].    Follow-Up: At [facility], you and your health needs are our priority.  As part of our continuing mission to provide you with exceptional heart care, we have created designated Provider Care Teams.  These Care Teams include your primary Cardiologist (physician) and Advanced Practice Providers (APPs -  Physician Assistants and Nurse Practitioners) who all work together to provide you with the care you need, when you need it.  We recommend signing up for the patient portal called "MyChart".  Sign up information is provided on this After Visit Summary.  MyChart is used to connect with patients for Virtual Visits (Telemedicine).  Patients are able to view lab/test results, encounter notes, upcoming appointments, etc.  Non-urgent messages can be sent to your provider as well.   To learn more about what you can do with MyChart, go to [email or web address].    Your next appointment:   6 - 8 week(s)  The format for your next appointment:   In Person  Provider:   You may see Kate Sable, MD or one of the following Advanced Practice Providers on your designated Care Team:   Murray Hodgkins, NP Christell Faith, PA-C Cadence Kathlen Mody, PA-C Gerrie Nordmann, NP

## 2022-12-30 NOTE — Progress Notes (Signed)
Cardiology Office Note:    Date:  12/30/2022   ID:  Krista Peters, DOB 03-16-1946, MRN [id number]  PCP:  [name], MD   [place] Providers Cardiologist:  Kate Sable, MD     Referring MD: [name], MD   Chief Complaint  Patient presents with   Other    Leaky heart valve discuss echo. Meds reviewed verbally with pt.    History of Present Illness:    Krista Peters is a 77 y.o. female with a hx of hypertension, hyperlipidemia, hypothyroidism who presents due to mitral regurgitation.  Patient has a history of cardiomyopathy in father and brother.  Echocardiogram was obtained 12//2020.  Showing mild to moderate MR, EF 45 to 50%.  She denies chest pain, shortness of breath, or edema.  Recently diagnosed with hypertension, hyperlipidemia, started on HCTZ 25 mg daily, Pravachol.  Compliant with medications as prescribed.  States feeling well overall.  Plans to have cataract surgery in about 2 weeks.  Past Medical History:  Diagnosis Date   Depression    Hearing aid worn    bilateral   Hypertension    Thyroid disease     Past Surgical History:  Procedure Laterality Date   APPENDECTOMY     BIOPSY THYROID  2020   BUNIONECTOMY Left 02/27/2019   Procedure: DOUBLE OSTECTOMY;  Surgeon: Samara Deist, DPM;  Location: [place];  Service: Podiatry;  Laterality: Left;  GENERAL WITH LOCAL   COLONOSCOPY  2015   Large adenoma removed laparoscopically   COLONOSCOPY WITH PROPOFOL N/A 11/06/2017   Procedure: COLONOSCOPY WITH PROPOFOL;  Surgeon: Lucilla Lame, MD;  Location: [place];  Service: Endoscopy;  Laterality: N/A;   LAPAROSCOPIC ILEOCECECTOMY  2015   Serrated adenoma   TONSILLECTOMY      Current Medications: Current Meds  Medication Sig   buPROPion (WELLBUTRIN XL) 150 MG 24 hr tablet TAKE 1 TABLET(150 MG) BY MOUTH DAILY   levothyroxine (SYNTHROID) 25 MCG tablet Take 25 mcg by mouth daily before breakfast.   losartan  (COZAAR) 25 MG tablet Take 1 tablet (25 mg total) by mouth daily.   metoprolol succinate (TOPROL XL) 25 MG 24 hr tablet Take 1 tablet (25 mg total) by mouth daily.   metoprolol tartrate (LOPRESSOR) 100 MG tablet Take one tablet ('100mg'$ ) by mouth two hours prior to cardiac CTA   oxybutynin (DITROPAN) 5 MG tablet TAKE 1 TABLET(5 MG) BY MOUTH DAILY   pravastatin (PRAVACHOL) 10 MG tablet Take 1 tablet (10 mg total) by mouth daily.   [DISCONTINUED] hydrochlorothiazide (HYDRODIURIL) 25 MG tablet Take 1 tablet (25 mg total) by mouth daily.     Allergies:   Lipitor [atorvastatin], Naproxen, Other, Penicillins, and Pneumovax 23 [pneumococcal vac polyvalent]   Social History   Socioeconomic History   Marital status: Married    Spouse name: Not on file   Number of children: 4   Years of education: Not on file   Highest education level: 12th grade  Occupational History   Occupation: Retired  Tobacco Use   Smoking status: Former    Packs/day: 0.25    Years: 10.00    Total pack years: 2.50    Types: Cigarettes    Quit date: 1970    Years since quitting: 54.0   Smokeless tobacco: Never   Tobacco comments:    smoking cessation materials not required  Vaping Use   Vaping Use: Never used  Substance and Sexual Activity   Alcohol use: Yes  Comment: occasionally   Drug use: Never   Sexual activity: Not Currently  Other Topics Concern   Not on file  Social History Narrative   Not on file   Social Determinants of Health   Financial Resource Strain: Low Risk  (09/14/2022)   Overall Financial Resource Strain (CARDIA)    Difficulty of Paying Living Expenses: Not hard at all  Food Insecurity: No Food Insecurity (09/14/2022)   Hunger Vital Sign    Worried About Running Out of Food in the Last Year: Never true    Ran Out of Food in the Last Year: Never true  Transportation Needs: No Transportation Needs (09/14/2022)   PRAPARE - Hydrologist (Medical): No    Lack of  Transportation (Non-Medical): No  Physical Activity: Sufficiently Active (09/14/2022)   Exercise Vital Sign    Days of Exercise per Week: 3 days    Minutes of Exercise per Session: 60 min  Stress: No Stress Concern Present (09/14/2022)   [place]    Feeling of Stress : Only a little  Social Connections: Moderately Integrated (09/14/2022)   Social Connection and Isolation Panel [NHANES]    Frequency of Communication with Friends and Family: More than three times a week    Frequency of Social Gatherings with Friends and Family: Once a week    Attends Religious Services: Never    Marine scientist or Organizations: Yes    Attends Music therapist: More than 4 times per year    Marital Status: Married     Family History: The patient's family history includes CAD in her brother and father; Cardiomyopathy in her brother and father; Diabetes in her brother and mother.  ROS:   Please see the history of present illness.     All other systems reviewed and are negative.  EKGs/Labs/Other Studies Reviewed:    The following studies were reviewed today:   EKG:  EKG is  ordered today.  The ekg ordered today demonstrates normal sinus rhythm  Recent Labs: 10/26/2022: ALT 13; BUN 12; Creatinine, Ser 0.75; Hemoglobin 14.2; Platelets 315; Potassium 4.3; Sodium 137  Recent Lipid Panel    Component Value Date/Time   CHOL 221 (H) 10/26/2022 0836   TRIG 81 10/26/2022 0836   HDL 71 10/26/2022 0836   CHOLHDL 3.1 10/26/2022 0836   LDLCALC 136 (H) 10/26/2022 0836     Risk Assessment/Calculations:     HYPERTENSION CONTROL Vitals:   12/30/22 0944 12/30/22 0948  BP: (!) 140/100 (!) 140/90    The patient's blood pressure is elevated above target today.  In order to address the patient's elevated BP: A new medication was prescribed today.            Physical Exam:    VS:  BP (!) 140/90 (BP Location: Right  Arm, Patient Position: Sitting, Cuff Size: Normal)   Pulse 76   Ht '5\' 6"'$  (1.676 m)   Wt 138 lb 2 oz (62.7 kg)   SpO2 96%   BMI 22.29 kg/m     Wt Readings from Last 3 Encounters:  12/30/22 138 lb 2 oz (62.7 kg)  10/26/22 137 lb (62.1 kg)  09/14/22 138 lb (62.6 kg)     GEN:  Well nourished, well developed in no acute distress HEENT: Normal NECK: No JVD; No carotid bruits CARDIAC: RRR, no murmurs, rubs, gallops RESPIRATORY:  Clear to auscultation without rales, wheezing or rhonchi  ABDOMEN:  Soft, non-tender, non-distended MUSCULOSKELETAL:  No edema; No deformity  SKIN: Warm and dry NEUROLOGIC:  Alert and oriented x 3 PSYCHIATRIC:  Normal affect   ASSESSMENT:    1. Ejection fraction < 50%   2. Primary hypertension   3. Mitral valve insufficiency, unspecified etiology   4. Pre-operative cardiovascular examination    PLAN:    In order of problems listed above:  Mildly reduced ejection fraction, EF 45 to 50%.  Denies chest pain or shortness of breath.  Start losartan 25 mg daily, Toprol-XL 25 mg daily.  Titrate GDMT as BP permits.  Obtain coronary CTA to evaluate CAD. Hypertension, BP elevated.  Start Toprol-XL and losartan as above.  Check BP at home and keep log.  Stop HCTZ. Mild to moderate MR, monitor serially with echoes. Preop eval, cataract surgery being planned.  I procedure is deemed low risk from a cardiac perspective.  Okay to proceed with surgical procedure from a cardiac perspective without additional testing.  Patient is clinically asymptomatic.  No symptoms of chest pain or shortness of breath.  Follow-up in 6 weeks      Medication Adjustments/Labs and Tests Ordered: Current medicines are reviewed at length with the patient today.  Concerns regarding medicines are outlined above.  Orders Placed This Encounter  Procedures   CT CORONARY MORPH W/CTA COR W/SCORE W/CA W/CM &/OR WO/CM   Basic Metabolic Panel (BMET)   EKG 12-Lead   Meds ordered this encounter   Medications   metoprolol succinate (TOPROL XL) 25 MG 24 hr tablet    Sig: Take 1 tablet (25 mg total) by mouth daily.    Dispense:  90 tablet    Refill:  3   losartan (COZAAR) 25 MG tablet    Sig: Take 1 tablet (25 mg total) by mouth daily.    Dispense:  90 tablet    Refill:  3   metoprolol tartrate (LOPRESSOR) 100 MG tablet    Sig: Take one tablet ('100mg'$ ) by mouth two hours prior to cardiac CTA    Dispense:  1 tablet    Refill:  0    Patient Instructions  Medication Instructions:   STOP Hydrochlorothiazide  START Losartan - take one tablet ('25mg'$ ) by mouth daily.  START Toprol XL - take one tablet ('25mg'$ ) by mouth daily.   *If you need a refill on your cardiac medications before your next appointment, please call your pharmacy*   Lab Work:  Your physician recommends you go to the medical mall to have lab work completed - BMP  If you have labs (blood work) drawn today and your tests are completely normal, you will receive your results only by: MyChart Message (if you have MyChart) OR A paper copy in the mail If you have any lab test that is abnormal or we need to change your treatment, we will call you to review the results.   Testing/Procedures:    Your cardiac CT will be scheduled at [facility] [address] [place], [place] [address] [phone]  please arrive 15 mins early for check-in and test prep.   On the Night Before the Test: Be sure to Drink plenty of water. Do not consume any caffeinated/decaffeinated beverages or chocolate 12 hours prior to your test. Do not take any antihistamines 12 hours prior to your test.  On the Day of the Test: Drink plenty of water until 1 hour prior to the test. Do not eat any food 1 hour prior to  test. You may take your regular medications prior to the test.  Take metoprolol (Lopressor) '100mg'$  two hours prior to test. FEMALES- please wear underwire-free bra if available,  avoid dresses & tight clothing      After the Test: Drink plenty of water. After receiving IV contrast, you may experience a mild flushed feeling. This is normal. On occasion, you may experience a mild rash up to 24 hours after the test. This is not dangerous. If this occurs, you can take Benadryl 25 mg and increase your fluid intake. If you experience trouble breathing, this can be serious. If it is severe call 911 IMMEDIATELY. If it is mild, please call our office.  We will call to schedule your test 2-4 weeks out understanding that some insurance companies will need an authorization prior to the service being performed.   For non-scheduling related questions, please contact the cardiac imaging nurse navigator should you have any questions/concerns: Marchia Bond, Cardiac Imaging Nurse Navigator Gordy Clement, Cardiac Imaging Nurse Navigator [facility] Direct Office Dial: [phone]   For scheduling needs, including cancellations and rescheduling, please call [place], [phone].    Follow-Up: At [facility], you and your health needs are our priority.  As part of our continuing mission to provide you with exceptional heart care, we have created designated Provider Care Teams.  These Care Teams include your primary Cardiologist (physician) and Advanced Practice Providers (APPs -  Physician Assistants and Nurse Practitioners) who all work together to provide you with the care you need, when you need it.  We recommend signing up for the patient portal called "MyChart".  Sign up information is provided on this After Visit Summary.  MyChart is used to connect with patients for Virtual Visits (Telemedicine).  Patients are able to view lab/test results, encounter notes, upcoming appointments, etc.  Non-urgent messages can be sent to your provider as well.   To learn more about what you can do with MyChart, go to [email or web address].    Your next  appointment:   6 - 8 week(s)  The format for your next appointment:   In Person  Provider:   You may see Kate Sable, MD or one of the following Advanced Practice Providers on your designated Care Team:   Murray Hodgkins, NP Christell Faith, PA-C Cadence Kathlen Mody, PA-C Gerrie Nordmann, NP      Signed, Kate Sable, MD  12/30/2022 10:41 AM    [place]

## 2023-01-13 NOTE — Discharge Instructions (Signed)
   Cataract Surgery, Care After ? ?This sheet gives you information about how to care for yourself after your surgery.  Your ophthalmologist may also give you more specific instructions.  If you have problems or questions, contact your doctor at [facility], [phone]. ? ?What can I expect after the surgery? ?It is common to have: ?Itching ?Foreign body sensation (feels like a grain of sand in the eye) ?Watery discharge (excess tearing) ?Sensitivity to light and touch ?Bruising in or around the eye ?Mild blurred vision ? ?Follow these instructions at home: ?Do not touch or rub your eyes. ?You may be told to wear a protective shield or sunglasses to protect your eyes. ?Do not put a contact lens in the operative eye unless your doctor approves. ?Keep the lids and face clean and dry. ?Do not allow water to hit you directly in the face while showering. ?Keep soap and shampoo out of your eyes. ?Do not use eye makeup for 1 week. ? ?Check your eye every day for signs of infection.  Watch for: ?Redness, swelling, or pain. ?Fluid, blood or pus. ?Worsening vision. ?Worsening sensitivity to light or touch. ? ?Activity: ?During the first day, avoid bending over and reading.  You may resume reading and bending the next day. ?Do not drive or use heavy machinery for at least 24 hours. ?Avoid strenuous activities for 1 week.  Activities such as walking, treadmill, exercise bike, and climbing stairs are okay. ?Do not lift heavy (>20 pound) objects for 1 week. ?Do not do yardwork, gardening, or dirty housework (mopping, cleaning bathrooms, vacuuming, etc.) for 1 week. ?Do not swim or use a hot tub for 2 weeks. ?Ask your doctor when you can return to work. ? ?General Instructions: ?Take or apply prescription and over-the-counter medicines as directed by your doctor, including eyedrops and ointments. ?Resume medications discontinued prior to surgery, unless told otherwise by your doctor. ?Keep all follow up appointments as  scheduled. ? ?Contact a health care provider if: ?You have increased bruising around your eye. ?You have pain that is not helped with medication. ?You have a fever. ?You have fluid, pus, or blood coming from your eye or incision. ?Your sensitivity to light gets worse. ?You have spots (floaters) of flashing lights in your vision. ?You have nausea or vomiting. ? ?Go to the nearest emergency room or call 911 if: ?You have sudden loss of vision. ?You have severe, worsening eye pain. ? ?

## 2023-01-16 ENCOUNTER — Ambulatory Visit: Payer: Medicare HMO | Admitting: General Practice

## 2023-01-16 ENCOUNTER — Encounter
Admission: RE | Disposition: A | Discharge status: Home / Self Care | Payer: Self-pay | Source: Home / Self Care | Attending: Ophthalmology

## 2023-01-16 ENCOUNTER — Other Ambulatory Visit: Payer: Self-pay

## 2023-01-16 ENCOUNTER — Ambulatory Visit
Admission: RE | Admit: 2023-01-16 | Discharge: 2023-01-16 | Disposition: A | Discharge status: Home / Self Care | Payer: Medicare HMO | Attending: Ophthalmology | Admitting: Ophthalmology

## 2023-01-16 ENCOUNTER — Encounter: Payer: Self-pay | Admitting: Ophthalmology

## 2023-01-16 DIAGNOSIS — I2510 Atherosclerotic heart disease of native coronary artery without angina pectoris: Secondary | ICD-10-CM | POA: Insufficient documentation

## 2023-01-16 DIAGNOSIS — H2511 Age-related nuclear cataract, right eye: Secondary | ICD-10-CM | POA: Insufficient documentation

## 2023-01-16 DIAGNOSIS — I10 Essential (primary) hypertension: Secondary | ICD-10-CM | POA: Insufficient documentation

## 2023-01-16 DIAGNOSIS — Z87891 Personal history of nicotine dependence: Secondary | ICD-10-CM | POA: Diagnosis not present

## 2023-01-16 DIAGNOSIS — H2512 Age-related nuclear cataract, left eye: Secondary | ICD-10-CM | POA: Diagnosis not present

## 2023-01-16 DIAGNOSIS — E039 Hypothyroidism, unspecified: Secondary | ICD-10-CM | POA: Insufficient documentation

## 2023-01-16 DIAGNOSIS — Z79890 Hormone replacement therapy (postmenopausal): Secondary | ICD-10-CM | POA: Diagnosis not present

## 2023-01-16 DIAGNOSIS — F32A Depression, unspecified: Secondary | ICD-10-CM | POA: Insufficient documentation

## 2023-01-16 DIAGNOSIS — H269 Unspecified cataract: Secondary | ICD-10-CM | POA: Diagnosis not present

## 2023-01-16 HISTORY — PX: CATARACT EXTRACTION W/PHACO: SHX586

## 2023-01-16 HISTORY — DX: Essential (primary) hypertension: I10

## 2023-01-16 SURGERY — PHACOEMULSIFICATION, CATARACT, WITH IOL INSERTION
Anesthesia: Monitor Anesthesia Care | Site: Eye | Laterality: Right

## 2023-01-16 MED ORDER — FENTANYL CITRATE (PF) 100 MCG/2ML IJ SOLN
INTRAMUSCULAR | Status: DC | PRN
  Administered 2023-01-16: 50 ug via INTRAVENOUS

## 2023-01-16 MED ORDER — ARMC OPHTHALMIC DILATING DROPS
1.0000 | OPHTHALMIC | Status: DC | PRN
  Administered 2023-01-16 (×3): 1 via OPHTHALMIC

## 2023-01-16 MED ORDER — SODIUM CHLORIDE 0.9% FLUSH
INTRAVENOUS | Status: DC | PRN
  Administered 2023-01-16: 10 mL via INTRAVENOUS

## 2023-01-16 MED ORDER — SIGHTPATH DOSE#1 NA HYALUR & NA CHOND-NA HYALUR IO KIT
PACK | INTRAOCULAR | Status: DC | PRN
  Administered 2023-01-16: 1 via OPHTHALMIC

## 2023-01-16 MED ORDER — ACETAMINOPHEN 160 MG/5ML PO SOLN: 325.0000 mg | ORAL | Status: DC | PRN

## 2023-01-16 MED ORDER — ONDANSETRON HCL 4 MG/2ML IJ SOLN: 4.0000 mg | Freq: Once | INTRAMUSCULAR | Status: DC | PRN

## 2023-01-16 MED ORDER — MOXIFLOXACIN HCL 0.5 % OP SOLN
OPHTHALMIC | Status: DC | PRN
  Administered 2023-01-16: .2 mL via OPHTHALMIC

## 2023-01-16 MED ORDER — SIGHTPATH DOSE#1 BSS IO SOLN
INTRAOCULAR | Status: DC | PRN
  Administered 2023-01-16: 98 mL via OPHTHALMIC

## 2023-01-16 MED ORDER — LACTATED RINGERS IV SOLN: INTRAVENOUS | Status: DC

## 2023-01-16 MED ORDER — MIDAZOLAM HCL 2 MG/2ML IJ SOLN
INTRAMUSCULAR | Status: DC | PRN
  Administered 2023-01-16: 1 mg via INTRAVENOUS

## 2023-01-16 MED ORDER — TETRACAINE HCL 0.5 % OP SOLN
1.0000 [drp] | OPHTHALMIC | Status: DC | PRN
  Administered 2023-01-16 (×3): 1 [drp] via OPHTHALMIC

## 2023-01-16 MED ORDER — ACETAMINOPHEN 325 MG PO TABS: 650.0000 mg | ORAL_TABLET | Freq: Once | ORAL | Status: DC | PRN

## 2023-01-16 MED ORDER — SIGHTPATH DOSE#1 BSS IO SOLN
INTRAOCULAR | Status: DC | PRN
  Administered 2023-01-16: 15 mL

## 2023-01-16 MED ORDER — LIDOCAINE HCL (PF) 2 % IJ SOLN
INTRAOCULAR | Status: DC | PRN
  Administered 2023-01-16: 1 mL via INTRAOCULAR

## 2023-01-16 SURGICAL SUPPLY — 14 items
CATARACT SUITE SIGHTPATH (MISCELLANEOUS) ×1 IMPLANT
DISSECTOR HYDRO NUCLEUS 50X22 (MISCELLANEOUS) ×1 IMPLANT
FEE CATARACT SUITE SIGHTPATH (MISCELLANEOUS) ×1 IMPLANT
GLOVE SURG GAMMEX PI TX LF 7.5 (GLOVE) ×1 IMPLANT
GLOVE SURG SYN 8.5  E (GLOVE) ×1
GLOVE SURG SYN 8.5 E (GLOVE) ×1 IMPLANT
GLOVE SURG SYN 8.5 PF PI (GLOVE) ×1 IMPLANT
LENS IOL EYHANCE TRC 600 20.5 IMPLANT
LENS TECNIS EYHANCE 20.5 ×1 IMPLANT
NDL FILTER BLUNT 18X1 1/2 (NEEDLE) ×1 IMPLANT
NEEDLE FILTER BLUNT 18X1 1/2 (NEEDLE) ×1 IMPLANT
SYR 3ML LL SCALE MARK (SYRINGE) ×1 IMPLANT
SYR 5ML LL (SYRINGE) ×1 IMPLANT
WATER STERILE IRR 250ML POUR (IV SOLUTION) ×1 IMPLANT

## 2023-01-16 NOTE — Anesthesia Preprocedure Evaluation (Addendum)
Anesthesia Evaluation  Patient identified by MRN, date of birth, ID band Patient awake    Reviewed: Allergy & Precautions, NPO status , Patient's Chart, lab work & pertinent test results  History of Anesthesia Complications Negative for: history of anesthetic complications  Airway Mallampati: I   Neck ROM: Full    Dental no notable dental hx.    Pulmonary former smoker (quit 1970)   Pulmonary exam normal breath sounds clear to auscultation       Cardiovascular hypertension, Normal cardiovascular exam+ Valvular Problems/Murmurs MR  Rhythm:Regular Rate:Normal     Neuro/Psych  PSYCHIATRIC DISORDERS  Depression    HOH    GI/Hepatic negative GI ROS,,,  Endo/Other  Hypothyroidism    Renal/GU negative Renal ROS     Musculoskeletal   Abdominal   Peds  Hematology negative hematology ROS (+)   Anesthesia Other Findings Cardiology note 12/30/22:  1.  Mildly reduced ejection fraction, EF 45 to 50%.  Denies chest pain or shortness of breath.  Start losartan 25 mg daily, Toprol-XL 25 mg daily.  Titrate GDMT as BP permits.  Obtain coronary CTA to evaluate CAD. 2. Hypertension, BP elevated.  Start Toprol-XL and losartan as above.  Check BP at home and keep log.  Stop HCTZ. 3. Mild to moderate MR, monitor serially with echoes. 4. Preop eval, cataract surgery being planned.  I procedure is deemed low risk from a cardiac perspective.  Okay to proceed with surgical procedure from a cardiac perspective without additional testing.  Patient is clinically asymptomatic.  No symptoms of chest pain or shortness of breath.   Follow-up in 6 weeks   Reproductive/Obstetrics                             Anesthesia Physical Anesthesia Plan  ASA: 2  Anesthesia Plan: MAC   Post-op Pain Management:    Induction: Intravenous  PONV Risk Score and Plan: 2 and Treatment may vary due to age or medical condition, Midazolam  and TIVA  Airway Management Planned: Natural Airway and Nasal Cannula  Additional Equipment:   Intra-op Plan:   Post-operative Plan:   Informed Consent: I have reviewed the patients History and Physical, chart, labs and discussed the procedure including the risks, benefits and alternatives for the proposed anesthesia with the patient or authorized representative who has indicated his/her understanding and acceptance.     Dental advisory given  Plan Discussed with: CRNA  Anesthesia Plan Comments: (LMA/GETA backup discussed.  Patient consented for risks of anesthesia including but not limited to:  - adverse reactions to medications - damage to eyes, teeth, lips or other oral mucosa - nerve damage due to positioning  - sore throat or hoarseness - damage to heart, brain, nerves, lungs, other parts of body or loss of life  Informed patient about role of CRNA in peri- and intra-operative care.  Patient voiced understanding.)        Anesthesia Quick Evaluation

## 2023-01-16 NOTE — H&P (Signed)
[facility]   Primary Care Physician:  [name], MD Ophthalmologist: Dr. Benay Pillow  Pre-Procedure History & Physical: HPI:  Krista Peters is a 77 y.o. female here for cataract surgery.   Past Medical History:  Diagnosis Date   Depression    Hearing aid worn    bilateral   Hypertension    Thyroid disease     Past Surgical History:  Procedure Laterality Date   APPENDECTOMY     BIOPSY THYROID  2020   BUNIONECTOMY Left 02/27/2019   Procedure: DOUBLE OSTECTOMY;  Surgeon: Samara Deist, DPM;  Location: [place];  Service: Podiatry;  Laterality: Left;  GENERAL WITH LOCAL   COLONOSCOPY  2015   Large adenoma removed laparoscopically   COLONOSCOPY WITH PROPOFOL N/A 11/06/2017   Procedure: COLONOSCOPY WITH PROPOFOL;  Surgeon: Lucilla Lame, MD;  Location: [place];  Service: Endoscopy;  Laterality: N/A;   LAPAROSCOPIC ILEOCECECTOMY  2015   Serrated adenoma   TONSILLECTOMY      Prior to Admission medications   Medication Sig Start Date End Date Taking? Authorizing Provider  buPROPion (WELLBUTRIN XL) 150 MG 24 hr tablet TAKE 1 TABLET(150 MG) BY MOUTH DAILY 12/21/22  Yes [name], MD  levothyroxine (SYNTHROID) 25 MCG tablet Take 25 mcg by mouth daily before breakfast. 09/04/19  Yes [provider]  losartan (COZAAR) 25 MG tablet Take 1 tablet (25 mg total) by mouth daily. 12/30/22 12/30/23 Yes Agbor-Etang, Aaron Edelman, MD  metoprolol succinate (TOPROL XL) 25 MG 24 hr tablet Take 1 tablet (25 mg total) by mouth daily. 12/30/22  Yes Kate Sable, MD  metoprolol tartrate (LOPRESSOR) 100 MG tablet Take one tablet ('100mg'$ ) by mouth two hours prior to cardiac CTA 12/30/22  Yes Agbor-Etang, Aaron Edelman, MD  oxybutynin (DITROPAN) 5 MG tablet TAKE 1 TABLET(5 MG) BY MOUTH DAILY 12/21/22  Yes [name], MD  pravastatin (PRAVACHOL) 10 MG tablet Take 1 tablet (10 mg total) by mouth daily. 10/27/22  Yes [name], MD    Allergies as of  12/06/2022 - Review Complete 10/26/2022  Allergen Reaction Noted   Lipitor [atorvastatin] Other (See Comments) 09/13/2021   Naproxen Rash 09/11/2015   Penicillins Rash 09/11/2015   Pneumovax 23 [pneumococcal vac polyvalent] Rash 09/11/2015    Family History  Problem Relation Age of Onset   Diabetes Mother    CAD Father    Cardiomyopathy Father    Diabetes Brother    Cardiomyopathy Brother    CAD Brother     Social History   Socioeconomic History   Marital status: Married    Spouse name: Not on file   Number of children: 4   Years of education: Not on file   Highest education level: 12th grade  Occupational History   Occupation: Retired  Tobacco Use   Smoking status: Former    Packs/day: 0.25    Years: 10.00    Total pack years: 2.50    Types: Cigarettes    Quit date: 1970    Years since quitting: 54.0   Smokeless tobacco: Never   Tobacco comments:    smoking cessation materials not required  Vaping Use   Vaping Use: Never used  Substance and Sexual Activity   Alcohol use: Yes    Comment: occasionally   Drug use: Never   Sexual activity: Not Currently  Other Topics Concern   Not on file  Social History Narrative   Not on file   Social Determinants of Health  Financial Resource Strain: Low Risk  (09/14/2022)   Overall Financial Resource Strain (CARDIA)    Difficulty of Paying Living Expenses: Not hard at all  Food Insecurity: No Food Insecurity (09/14/2022)   Hunger Vital Sign    Worried About Running Out of Food in the Last Year: Never true    Ran Out of Food in the Last Year: Never true  Transportation Needs: No Transportation Needs (09/14/2022)   PRAPARE - Hydrologist (Medical): No    Lack of Transportation (Non-Medical): No  Physical Activity: Sufficiently Active (09/14/2022)   Exercise Vital Sign    Days of Exercise per Week: 3 days    Minutes of Exercise per Session: 60 min  Stress: No Stress Concern Present (09/14/2022)    [place]    Feeling of Stress : Only a little  Social Connections: Moderately Integrated (09/14/2022)   Social Connection and Isolation Panel [NHANES]    Frequency of Communication with Friends and Family: More than three times a week    Frequency of Social Gatherings with Friends and Family: Once a week    Attends Religious Services: Never    Marine scientist or Organizations: Yes    Attends Music therapist: More than 4 times per year    Marital Status: Married  Human resources officer Violence: Not At Risk (09/14/2022)   Humiliation, Afraid, Rape, and Kick questionnaire    Fear of Current or Ex-Partner: No    Emotionally Abused: No    Physically Abused: No    Sexually Abused: No    Review of Systems: See HPI, otherwise negative ROS  Physical Exam: BP (!) 158/72   Temp (!) 97.2 F (36.2 C) (Tympanic)   Ht 5' 5.98" (1.676 m)   Wt 63.5 kg   SpO2 99%   BMI 22.61 kg/m  General:   Alert, cooperative in NAD Head:  Normocephalic and atraumatic. Respiratory:  Normal work of breathing. Cardiovascular:  RRR  Impression/Plan: Krista Peters is here for cataract surgery.  Risks, benefits, limitations, and alternatives regarding cataract surgery have been reviewed with the patient.  Questions have been answered.  All parties agreeable.   Benay Pillow, MD  01/16/2023, 12:31 PM

## 2023-01-16 NOTE — Transfer of Care (Signed)
Immediate Anesthesia Transfer of Care Note  Patient: Krista Peters  Procedure(s) Performed: CATARACT EXTRACTION PHACO AND INTRAOCULAR LENS PLACEMENT (IOC) RIGHT EYHANCE TORIC  6.10  00:35.9 (Right: Eye)  Patient Location: PACU  Anesthesia Type: MAC  Level of Consciousness: awake, alert  and patient cooperative  Airway and Oxygen Therapy: Patient Spontanous Breathing and Patient connected to supplemental oxygen  Post-op Assessment: Post-op Vital signs reviewed, Patient's Cardiovascular Status Stable, Respiratory Function Stable, Patent Airway and No signs of Nausea or vomiting  Post-op Vital Signs: Reviewed and stable  Complications: No notable events documented.

## 2023-01-16 NOTE — Anesthesia Postprocedure Evaluation (Signed)
Anesthesia Post Note  Patient: TABITHA RIGGINS  Procedure(s) Performed: CATARACT EXTRACTION PHACO AND INTRAOCULAR LENS PLACEMENT (IOC) RIGHT EYHANCE TORIC  6.10  00:35.9 (Right: Eye)  Patient location during evaluation: PACU Anesthesia Type: MAC Level of consciousness: awake and alert, oriented and patient cooperative Pain management: pain level controlled Vital Signs Assessment: post-procedure vital signs reviewed and stable Respiratory status: spontaneous breathing, nonlabored ventilation and respiratory function stable Cardiovascular status: blood pressure returned to baseline and stable Postop Assessment: adequate PO intake Anesthetic complications: no   No notable events documented.   Last Vitals:  Vitals:   01/16/23 1259 01/16/23 1304  BP: (!) 141/73 (!) 153/75  Pulse: 63 69  Resp: 14 19  Temp: (!) 36.3 C (!) 36.3 C  SpO2: 100% 100%    Last Pain:  Vitals:   01/16/23 1304  TempSrc:   PainSc: 0-No pain                 Darrin Nipper

## 2023-01-16 NOTE — Op Note (Signed)
OPERATIVE NOTE  Krista Peters [id number] 01/16/2023   PREOPERATIVE DIAGNOSIS:  Nuclear sclerotic cataract right eye.  H25.11   POSTOPERATIVE DIAGNOSIS:    Nuclear sclerotic cataract right eye.     PROCEDURE:  Phacoemusification with posterior chamber intraocular lens placement of the right eye   LENS:   Implant Name Type Inv. Item Serial No. Manufacturer Lot No. LRB No. Used Action  LENS TECNIS EYHANCE 20.5 - [id number]  LENS TECNIS EYHANCE 20.5 [id number] [employer]  Right 1 Implanted       Procedure(s): CATARACT EXTRACTION PHACO AND INTRAOCULAR LENS PLACEMENT (IOC) RIGHT EYHANCE TORIC  6.10  00:35.9 (Right)  [id number] +20.5   ULTRASOUND TIME: 0 minutes 35 seconds.  CDE 6.10   SURGEON:  Benay Pillow, MD, MPH  ANESTHESIOLOGIST: Anesthesiologist: Darrin Nipper, MD CRNA: Hilbert Odor, CRNA   ANESTHESIA:  Topical with tetracaine drops augmented with 1% preservative-free intracameral lidocaine.  ESTIMATED BLOOD LOSS: less than 1 mL.   COMPLICATIONS:  None.   DESCRIPTION OF PROCEDURE:  The patient was identified in the holding room and transported to the operating room and placed in the supine position under the operating microscope.  The right eye was identified as the operative eye and it was prepped and draped in the usual sterile ophthalmic fashion.  The verion system was registered without difficulty.   A 1.0 millimeter clear-corneal paracentesis was made at the 10:30 position. 0.5 ml of preservative-free 1% lidocaine with epinephrine was injected into the anterior chamber.  The anterior chamber was filled with viscoelastic.  A 2.4 millimeter keratome was used to make a near-clear corneal incision at the 8:00 position.  A curvilinear capsulorrhexis was made with a cystotome and capsulorrhexis forceps.  Balanced salt solution was used to hydrodissect and hydrodelineate the nucleus.   Phacoemulsification was then used in stop and chop fashion to remove the lens nucleus and  epinucleus.  The remaining cortex was then removed using the irrigation and aspiration handpiece. Viscoelastic was then placed into the capsular bag to distend it for lens placement.  A lens was then injected into the capsular bag.  The remaining viscoelastic was aspirated.  The lens was rotated with guidance from the [place] system.   Wounds were hydrated with balanced salt solution.  The anterior chamber was inflated to a physiologic pressure with balanced salt solution. The lens was well centered.  Intracameral vigamox 0.1 mL undiluted was injected into the eye and a drop placed onto the ocular surface.  No wound leaks were noted.  The patient was taken to the recovery room in stable condition without complications of anesthesia or surgery  Benay Pillow 01/16/2023, 12:58 PM

## 2023-01-18 ENCOUNTER — Encounter: Payer: Self-pay | Admitting: Ophthalmology

## 2023-01-19 ENCOUNTER — Encounter: Payer: Self-pay | Admitting: Ophthalmology

## 2023-01-25 ENCOUNTER — Telehealth (HOSPITAL_COMMUNITY): Payer: Self-pay | Admitting: Emergency Medicine

## 2023-01-25 ENCOUNTER — Telehealth (HOSPITAL_COMMUNITY): Payer: Self-pay | Admitting: *Deleted

## 2023-01-25 NOTE — Telephone Encounter (Signed)
Reaching out to patient to offer assistance regarding upcoming cardiac imaging study; pt verbalizes understanding of appt date/time, parking situation and where to check in, pre-test NPO status and medications ordered, and verified current allergies; name and call back number provided for further questions should they arise Krista Bond RN Navigator Cardiac Imaging [facility] [phone] office [phone] cell  Arrival 915 OPIC '100mg'$  metoprolol tartrate  Hold metoprolol succinate  Denies iv issues Aware contrast/nitro

## 2023-01-25 NOTE — Telephone Encounter (Signed)
Attempted to call patient regarding upcoming cardiac CT appointment. °Left message on voicemail with name and callback number ° °Appollonia Klee RN Navigator Cardiac Imaging °[facility] °[phone] Office °[phone] Cell ° °

## 2023-01-26 ENCOUNTER — Ambulatory Visit
Admission: RE | Admit: 2023-01-26 | Discharge: 2023-01-26 | Disposition: A | Discharge status: Home / Self Care | Payer: Medicare HMO | Source: Ambulatory Visit | Attending: Cardiology | Admitting: Cardiology

## 2023-01-26 DIAGNOSIS — R9430 Abnormal result of cardiovascular function study, unspecified: Secondary | ICD-10-CM

## 2023-01-26 MED ORDER — IOHEXOL 350 MG/ML SOLN
75.0000 mL | Freq: Once | INTRAVENOUS | Status: AC | PRN
  Administered 2023-01-26: 75 mL via INTRAVENOUS

## 2023-01-26 MED ORDER — NITROGLYCERIN 0.4 MG SL SUBL
0.8000 mg | SUBLINGUAL_TABLET | Freq: Once | SUBLINGUAL | Status: AC
  Administered 2023-01-26: 0.8 mg via SUBLINGUAL

## 2023-01-26 NOTE — Discharge Instructions (Signed)

## 2023-01-26 NOTE — Progress Notes (Signed)
Patient tolerated CT well. Drank water after. Vital signs stable encourage to drink water throughout day.Reasons explained and verbalized understanding. Ambulated steady gait.  

## 2023-01-30 ENCOUNTER — Ambulatory Visit: Payer: Medicare HMO | Admitting: Anesthesiology

## 2023-01-30 ENCOUNTER — Encounter: Payer: Self-pay | Admitting: Ophthalmology

## 2023-01-30 ENCOUNTER — Encounter
Admission: RE | Disposition: A | Discharge status: Home / Self Care | Payer: Self-pay | Source: Home / Self Care | Attending: Ophthalmology

## 2023-01-30 ENCOUNTER — Other Ambulatory Visit: Payer: Self-pay

## 2023-01-30 ENCOUNTER — Ambulatory Visit
Admission: RE | Admit: 2023-01-30 | Discharge: 2023-01-30 | Disposition: A | Discharge status: Home / Self Care | Payer: Medicare HMO | Attending: Ophthalmology | Admitting: Ophthalmology

## 2023-01-30 DIAGNOSIS — Z87891 Personal history of nicotine dependence: Secondary | ICD-10-CM | POA: Insufficient documentation

## 2023-01-30 DIAGNOSIS — E039 Hypothyroidism, unspecified: Secondary | ICD-10-CM | POA: Diagnosis not present

## 2023-01-30 DIAGNOSIS — F32A Depression, unspecified: Secondary | ICD-10-CM | POA: Insufficient documentation

## 2023-01-30 DIAGNOSIS — E079 Disorder of thyroid, unspecified: Secondary | ICD-10-CM | POA: Diagnosis not present

## 2023-01-30 DIAGNOSIS — I10 Essential (primary) hypertension: Secondary | ICD-10-CM | POA: Diagnosis not present

## 2023-01-30 DIAGNOSIS — H2512 Age-related nuclear cataract, left eye: Secondary | ICD-10-CM | POA: Diagnosis not present

## 2023-01-30 DIAGNOSIS — Z09 Encounter for follow-up examination after completed treatment for conditions other than malignant neoplasm: Secondary | ICD-10-CM | POA: Insufficient documentation

## 2023-01-30 HISTORY — PX: CATARACT EXTRACTION W/PHACO: SHX586

## 2023-01-30 SURGERY — PHACOEMULSIFICATION, CATARACT, WITH IOL INSERTION
Anesthesia: Monitor Anesthesia Care | Site: Eye | Laterality: Left

## 2023-01-30 MED ORDER — SIGHTPATH DOSE#1 BSS IO SOLN
INTRAOCULAR | Status: DC | PRN
  Administered 2023-01-30: 15 mL via INTRAOCULAR

## 2023-01-30 MED ORDER — SIGHTPATH DOSE#1 NA HYALUR & NA CHOND-NA HYALUR IO KIT
PACK | INTRAOCULAR | Status: DC | PRN
  Administered 2023-01-30: 1 via OPHTHALMIC

## 2023-01-30 MED ORDER — LACTATED RINGERS IV SOLN: INTRAVENOUS | Status: DC

## 2023-01-30 MED ORDER — MIDAZOLAM HCL 2 MG/2ML IJ SOLN
INTRAMUSCULAR | Status: DC | PRN
  Administered 2023-01-30: 1 mg via INTRAVENOUS

## 2023-01-30 MED ORDER — SODIUM CHLORIDE 0.9% FLUSH
INTRAVENOUS | Status: DC | PRN
  Administered 2023-01-30: 10 mL via INTRAVENOUS

## 2023-01-30 MED ORDER — LIDOCAINE HCL (PF) 2 % IJ SOLN
INTRAOCULAR | Status: DC | PRN
  Administered 2023-01-30: 4 mL via INTRAOCULAR

## 2023-01-30 MED ORDER — SIGHTPATH DOSE#1 BSS IO SOLN
INTRAOCULAR | Status: DC | PRN
  Administered 2023-01-30: 103 mL via OPHTHALMIC

## 2023-01-30 MED ORDER — ARMC OPHTHALMIC DILATING DROPS
1.0000 | OPHTHALMIC | Status: DC | PRN
  Administered 2023-01-30 (×3): 1 via OPHTHALMIC

## 2023-01-30 MED ORDER — FENTANYL CITRATE (PF) 100 MCG/2ML IJ SOLN
INTRAMUSCULAR | Status: DC | PRN
  Administered 2023-01-30: 50 ug via INTRAVENOUS

## 2023-01-30 MED ORDER — TETRACAINE HCL 0.5 % OP SOLN
1.0000 [drp] | OPHTHALMIC | Status: DC | PRN
  Administered 2023-01-30 (×3): 1 [drp] via OPHTHALMIC

## 2023-01-30 MED ORDER — MOXIFLOXACIN HCL 0.5 % OP SOLN
OPHTHALMIC | Status: DC | PRN
  Administered 2023-01-30: .2 mL via OPHTHALMIC

## 2023-01-30 SURGICAL SUPPLY — 20 items
CANNULA ANT/CHMB 27G (MISCELLANEOUS) IMPLANT
CANNULA ANT/CHMB 27GA (MISCELLANEOUS) IMPLANT
CATARACT SUITE SIGHTPATH (MISCELLANEOUS) ×1 IMPLANT
DISSECTOR HYDRO NUCLEUS 50X22 (MISCELLANEOUS) ×1 IMPLANT
FEE CATARACT SUITE SIGHTPATH (MISCELLANEOUS) ×1 IMPLANT
GLOVE SURG GAMMEX PI TX LF 7.5 (GLOVE) ×1 IMPLANT
GLOVE SURG SYN 8.5  E (GLOVE) ×1
GLOVE SURG SYN 8.5 E (GLOVE) ×1 IMPLANT
GLOVE SURG SYN 8.5 PF PI (GLOVE) ×1 IMPLANT
LENS IOL EYHANCE TRC 450 21.0 IMPLANT
LENS TECNIS EYHANCE TORIC2 21 ×1 IMPLANT
NDL FILTER BLUNT 18X1 1/2 (NEEDLE) ×1 IMPLANT
NEEDLE FILTER BLUNT 18X1 1/2 (NEEDLE) ×1 IMPLANT
PACK VIT ANT 23G (MISCELLANEOUS) IMPLANT
RING MALYGIN (MISCELLANEOUS) IMPLANT
SUT ETHILON 10-0 CS-B-6CS-B-6 (SUTURE)
SUTURE EHLN 10-0 CS-B-6CS-B-6 (SUTURE) IMPLANT
SYR 3ML LL SCALE MARK (SYRINGE) ×1 IMPLANT
SYR 5ML LL (SYRINGE) ×1 IMPLANT
WATER STERILE IRR 250ML POUR (IV SOLUTION) ×1 IMPLANT

## 2023-01-30 NOTE — Anesthesia Postprocedure Evaluation (Signed)
Anesthesia Post Note  Patient: Krista Peters  Procedure(s) Performed: CATARACT EXTRACTION PHACO AND INTRAOCULAR LENS PLACEMENT (IOC) LEFT EYHANCE TORIC 4.81 00:28.2 (Left: Eye)  Patient location during evaluation: PACU Anesthesia Type: MAC Level of consciousness: awake and alert Pain management: pain level controlled Vital Signs Assessment: post-procedure vital signs reviewed and stable Respiratory status: spontaneous breathing, nonlabored ventilation, respiratory function stable and patient connected to nasal cannula oxygen Cardiovascular status: stable and blood pressure returned to baseline Postop Assessment: no apparent nausea or vomiting Anesthetic complications: no   No notable events documented.   Last Vitals:  Vitals:   01/30/23 1054 01/30/23 1058  BP: (!) 140/68 (!) 149/75  Pulse: 67 63  Resp: 17 12  Temp: (!) 36.2 C (!) 36.2 C  SpO2: 100% 99%    Last Pain:  Vitals:   01/30/23 1058  TempSrc:   PainSc: 0-No pain                 Martha Clan

## 2023-01-30 NOTE — Transfer of Care (Addendum)
Immediate Anesthesia Transfer of Care Note  Patient: Krista Peters  Procedure(s) Performed: CATARACT EXTRACTION PHACO AND INTRAOCULAR LENS PLACEMENT (IOC) LEFT EYHANCE TORIC (Left)  Patient Location: PACU  Anesthesia Type:MAC  Level of Consciousness: awake, alert , and oriented  Airway & Oxygen Therapy: Patient Spontanous Breathing  Post-op Assessment: Report given to RN  Post vital signs: Reviewed and stable  Last Vitals: VSS in PACU  See flow sheet Vitals Value Taken Time  BP    Temp    Pulse    Resp    SpO2      Last Pain:  Vitals:   01/30/23 0948  TempSrc: Tympanic  PainSc: 0-No pain         Complications: No notable events documented.

## 2023-01-30 NOTE — H&P (Signed)
[facility]   Primary Care Physician:  [name], MD Ophthalmologist: Dr. Benay Pillow  Pre-Procedure History & Physical: HPI:  Krista Peters is a 77 y.o. female here for cataract surgery.   Past Medical History:  Diagnosis Date   Depression    Hearing aid worn    bilateral   Hypertension    Thyroid disease     Past Surgical History:  Procedure Laterality Date   APPENDECTOMY     BIOPSY THYROID  2020   BUNIONECTOMY Left 02/27/2019   Procedure: DOUBLE OSTECTOMY;  Surgeon: Samara Deist, DPM;  Location: [place];  Service: Podiatry;  Laterality: Left;  GENERAL WITH LOCAL   CATARACT EXTRACTION W/PHACO Right 01/16/2023   Procedure: CATARACT EXTRACTION PHACO AND INTRAOCULAR LENS PLACEMENT ([place]) RIGHT EYHANCE TORIC  6.10  00:35.9;  Surgeon: Eulogio Bear, MD;  Location: [place];  Service: Ophthalmology;  Laterality: Right;   COLONOSCOPY  2015   Large adenoma removed laparoscopically   COLONOSCOPY WITH PROPOFOL N/A 11/06/2017   Procedure: COLONOSCOPY WITH PROPOFOL;  Surgeon: Lucilla Lame, MD;  Location: [place];  Service: Endoscopy;  Laterality: N/A;   LAPAROSCOPIC ILEOCECECTOMY  2015   Serrated adenoma   TONSILLECTOMY      Prior to Admission medications   Medication Sig Start Date End Date Taking? Authorizing Provider  buPROPion (WELLBUTRIN XL) 150 MG 24 hr tablet TAKE 1 TABLET(150 MG) BY MOUTH DAILY 12/21/22  Yes [name], MD  levothyroxine (SYNTHROID) 25 MCG tablet Take 25 mcg by mouth daily before breakfast. 09/04/19  Yes [provider]  losartan (COZAAR) 25 MG tablet Take 1 tablet (25 mg total) by mouth daily. 12/30/22 12/30/23 Yes Agbor-Etang, Aaron Edelman, MD  metoprolol tartrate (LOPRESSOR) 100 MG tablet Take one tablet ('100mg'$ ) by mouth two hours prior to cardiac CTA 12/30/22  Yes Agbor-Etang, Aaron Edelman, MD  oxybutynin (DITROPAN) 5 MG tablet TAKE 1 TABLET(5 MG) BY MOUTH DAILY 12/21/22  Yes [name], MD   pravastatin (PRAVACHOL) 10 MG tablet Take 1 tablet (10 mg total) by mouth daily. 10/27/22  Yes [name], MD  metoprolol succinate (TOPROL XL) 25 MG 24 hr tablet Take 1 tablet (25 mg total) by mouth daily. Patient not taking: Reported on 01/30/2023 12/30/22   Kate Sable, MD    Allergies as of 12/06/2022 - Review Complete 10/26/2022  Allergen Reaction Noted   Lipitor [atorvastatin] Other (See Comments) 09/13/2021   Naproxen Rash 09/11/2015   Penicillins Rash 09/11/2015   Pneumovax 23 [pneumococcal vac polyvalent] Rash 09/11/2015    Family History  Problem Relation Age of Onset   Diabetes Mother    CAD Father    Cardiomyopathy Father    Diabetes Brother    Cardiomyopathy Brother    CAD Brother     Social History   Socioeconomic History   Marital status: Married    Spouse name: Not on file   Number of children: 4   Years of education: Not on file   Highest education level: 12th grade  Occupational History   Occupation: Retired  Tobacco Use   Smoking status: Former    Packs/day: 0.25    Years: 10.00    Total pack years: 2.50    Types: Cigarettes    Quit date: 1970    Years since quitting: 54.1   Smokeless tobacco: Never   Tobacco comments:    smoking cessation materials not required  Vaping Use   Vaping Use: Never used  Substance and  Sexual Activity   Alcohol use: Yes    Comment: occasionally   Drug use: Never   Sexual activity: Not Currently  Other Topics Concern   Not on file  Social History Narrative   Not on file   Social Determinants of Health   Financial Resource Strain: Low Risk  (09/14/2022)   Overall Financial Resource Strain (CARDIA)    Difficulty of Paying Living Expenses: Not hard at all  Food Insecurity: No Food Insecurity (09/14/2022)   Hunger Vital Sign    Worried About Running Out of Food in the Last Year: Never true    Ran Out of Food in the Last Year: Never true  Transportation Needs: No Transportation Needs (09/14/2022)    PRAPARE - Hydrologist (Medical): No    Lack of Transportation (Non-Medical): No  Physical Activity: Sufficiently Active (09/14/2022)   Exercise Vital Sign    Days of Exercise per Week: 3 days    Minutes of Exercise per Session: 60 min  Stress: No Stress Concern Present (09/14/2022)   [place]    Feeling of Stress : Only a little  Social Connections: Moderately Integrated (09/14/2022)   Social Connection and Isolation Panel [NHANES]    Frequency of Communication with Friends and Family: More than three times a week    Frequency of Social Gatherings with Friends and Family: Once a week    Attends Religious Services: Never    Marine scientist or Organizations: Yes    Attends Music therapist: More than 4 times per year    Marital Status: Married  Human resources officer Violence: Not At Risk (09/14/2022)   Humiliation, Afraid, Rape, and Kick questionnaire    Fear of Current or Ex-Partner: No    Emotionally Abused: No    Physically Abused: No    Sexually Abused: No    Review of Systems: See HPI, otherwise negative ROS  Physical Exam: BP (!) 166/80   Temp (!) 97.5 F (36.4 C) (Tympanic)   Resp 18   Ht 5' 5.98" (1.676 m)   Wt 63.1 kg   SpO2 100%   BMI 22.46 kg/m  General:   Alert, cooperative in NAD Head:  Normocephalic and atraumatic. Respiratory:  Normal work of breathing. Cardiovascular:  RRR  Impression/Plan: Krista Peters is here for cataract surgery.  Risks, benefits, limitations, and alternatives regarding cataract surgery have been reviewed with the patient.  Questions have been answered.  All parties agreeable.   Benay Pillow, MD  01/30/2023, 10:25 AM

## 2023-01-30 NOTE — Op Note (Signed)
OPERATIVE NOTE  SUEKO DIMICHELE [id number] 01/30/2023   PREOPERATIVE DIAGNOSIS:  Nuclear sclerotic cataract left eye.  H25.12   POSTOPERATIVE DIAGNOSIS:    Nuclear sclerotic cataract left eye.     PROCEDURE:  Phacoemusification with posterior chamber intraocular lens placement of the left eye   LENS:   Implant Name Type Inv. Item Serial No. Manufacturer Lot No. LRB No. Used Action  LENS TECNIS EYHANCE TORIC2 21 - [id number]  LENS TECNIS EYHANCE TORIC2 21 [id number] [employer]  Left 1 Implanted      Procedure(s): CATARACT EXTRACTION PHACO AND INTRAOCULAR LENS PLACEMENT (IOC) LEFT EYHANCE TORIC 4.81 00:28.2 (Left)  [id number] +21.0   ULTRASOUND TIME: 0 minutes 28 seconds.  CDE 4.81   SURGEON:  Benay Pillow, MD, MPH   ANESTHESIA:  Topical with tetracaine drops augmented with 1% preservative-free intracameral lidocaine.  ESTIMATED BLOOD LOSS: <1 mL   COMPLICATIONS:  None.   DESCRIPTION OF PROCEDURE:  The patient was identified in the holding room and transported to the operating room and placed in the supine position under the operating microscope.  The left eye was identified as the operative eye and it was prepped and draped in the usual sterile ophthalmic fashion.  The verion system was registered without difficulty.   A 1.0 millimeter clear-corneal paracentesis was made at the 5:00 position. 0.5 ml of preservative-free 1% lidocaine with epinephrine was injected into the anterior chamber.  The anterior chamber was filled with viscoelastic.  A 2.4 millimeter keratome was used to make a near-clear corneal incision at the 2:00 position.  A curvilinear capsulorrhexis was made with a cystotome and capsulorrhexis forceps.  Balanced salt solution was used to hydrodissect and hydrodelineate the nucleus.   Phacoemulsification was then used in stop and chop fashion to remove the lens nucleus and epinucleus.  The remaining cortex was then removed using the irrigation and aspiration handpiece.  Viscoelastic was then placed into the capsular bag to distend it for lens placement.  A lens was then injected into the capsular bag.  The remaining viscoelastic was aspirated.  The lens was rotated with guidance from the [place] system.   Wounds were hydrated with balanced salt solution.  The anterior chamber was inflated to a physiologic pressure with balanced salt solution.  Intracameral vigamox 0.1 mL undiltued was injected into the eye and a drop placed onto the ocular surface.  No wound leaks were noted.  The patient was taken to the recovery room in stable condition without complications of anesthesia or surgery  Benay Pillow 01/30/2023, 10:52 AM

## 2023-01-30 NOTE — Anesthesia Preprocedure Evaluation (Signed)
Anesthesia Evaluation  Patient identified by MRN, date of birth, ID band Patient awake    Reviewed: Allergy & Precautions, NPO status , Patient's Chart, lab work & pertinent test results  History of Anesthesia Complications Negative for: history of anesthetic complications  Airway Mallampati: I   Neck ROM: Full    Dental no notable dental hx.    Pulmonary former smoker   Pulmonary exam normal breath sounds clear to auscultation       Cardiovascular hypertension, Normal cardiovascular exam+ Valvular Problems/Murmurs MR  Rhythm:Regular Rate:Normal     Neuro/Psych  PSYCHIATRIC DISORDERS  Depression    HOH    GI/Hepatic negative GI ROS,,,  Endo/Other  Hypothyroidism    Renal/GU negative Renal ROS     Musculoskeletal   Abdominal   Peds  Hematology negative hematology ROS (+)   Anesthesia Other Findings Cardiology note 12/30/22:  1.  Mildly reduced ejection fraction, EF 45 to 50%.  Denies chest pain or shortness of breath.  Start losartan 25 mg daily, Toprol-XL 25 mg daily.  Titrate GDMT as BP permits.  Obtain coronary CTA to evaluate CAD. 2. Hypertension, BP elevated.  Start Toprol-XL and losartan as above.  Check BP at home and keep log.  Stop HCTZ. 3. Mild to moderate MR, monitor serially with echoes. 4. Preop eval, cataract surgery being planned.  I procedure is deemed low risk from a cardiac perspective.  Okay to proceed with surgical procedure from a cardiac perspective without additional testing.  Patient is clinically asymptomatic.  No symptoms of chest pain or shortness of breath.   Follow-up in 6 weeks   Reproductive/Obstetrics                              Anesthesia Physical Anesthesia Plan  ASA: 2  Anesthesia Plan: MAC   Post-op Pain Management:    Induction: Intravenous  PONV Risk Score and Plan: 2 and Treatment may vary due to age or medical condition, Midazolam and  TIVA  Airway Management Planned: Natural Airway and Nasal Cannula  Additional Equipment:   Intra-op Plan:   Post-operative Plan:   Informed Consent: I have reviewed the patients History and Physical, chart, labs and discussed the procedure including the risks, benefits and alternatives for the proposed anesthesia with the patient or authorized representative who has indicated his/her understanding and acceptance.     Dental advisory given  Plan Discussed with: CRNA  Anesthesia Plan Comments: (LMA/GETA backup discussed.  Patient consented for risks of anesthesia including but not limited to:  - adverse reactions to medications - damage to eyes, teeth, lips or other oral mucosa - nerve damage due to positioning  - sore throat or hoarseness - damage to heart, brain, nerves, lungs, other parts of body or loss of life  Informed patient about role of CRNA in peri- and intra-operative care.  Patient voiced understanding.)         Anesthesia Quick Evaluation

## 2023-01-31 ENCOUNTER — Encounter: Payer: Self-pay | Admitting: Ophthalmology

## 2023-01-31 ENCOUNTER — Ambulatory Visit: Payer: Medicare Other | Admitting: Cardiology

## 2023-02-10 ENCOUNTER — Ambulatory Visit: Payer: Medicare HMO | Attending: Cardiology | Admitting: Cardiology

## 2023-02-10 ENCOUNTER — Encounter: Payer: Self-pay | Admitting: Cardiology

## 2023-02-10 VITALS — BP 138/78 | HR 68 | Ht 66.0 in | Wt 140.6 lb

## 2023-02-10 DIAGNOSIS — R9430 Abnormal result of cardiovascular function study, unspecified: Secondary | ICD-10-CM | POA: Diagnosis not present

## 2023-02-10 DIAGNOSIS — I10 Essential (primary) hypertension: Secondary | ICD-10-CM | POA: Diagnosis not present

## 2023-02-10 DIAGNOSIS — I340 Nonrheumatic mitral (valve) insufficiency: Secondary | ICD-10-CM

## 2023-02-10 MED ORDER — LOSARTAN POTASSIUM 50 MG PO TABS: 50.0000 mg | ORAL_TABLET | Freq: Every day | ORAL | 3 refills | Status: DC

## 2023-02-10 NOTE — Progress Notes (Signed)
Cardiology Office Note:    Date:  02/10/2023   ID:  Krista Peters, DOB 1946/10/20, MRN [id number]  PCP:  [name], MD   [place] Providers Cardiologist:  Kate Sable, MD     Referring MD: [name], MD   Chief Complaint  Patient presents with   Follow-up    Testing f/u, pressure reading range from 120s-150s over 60s-90s at home    History of Present Illness:    Krista Peters is a 77 y.o. female with a hx of hypertension, hyperlipidemia, mildly reduced EF 45 to 50%, mild to moderate MR, hypothyroidism who presents for follow-up.  Previously seen for mildly reduced EF, coronary CT was obtained to evaluate CAD.  Coronary CT was normal, patient denies chest pain or shortness of breath.  Started on Toprol-XL and losartan.  Tolerating medications without any adverse effects.  Blood pressure at home ranges in the 140s to 150s.  Prior notes Echocardiogram  12//2020. mild to moderate MR, EF 45 to 50%. Coronary CT 2/24, no evidence of CAD.  Past Medical History:  Diagnosis Date   Depression    Hearing aid worn    bilateral   Hypertension    Thyroid disease     Past Surgical History:  Procedure Laterality Date   APPENDECTOMY     BIOPSY THYROID  2020   BUNIONECTOMY Left 02/27/2019   Procedure: DOUBLE OSTECTOMY;  Surgeon: Samara Deist, DPM;  Location: [facility];  Service: Podiatry;  Laterality: Left;  GENERAL WITH LOCAL   CATARACT EXTRACTION W/PHACO Right 01/16/2023   Procedure: CATARACT EXTRACTION PHACO AND INTRAOCULAR LENS PLACEMENT ([place]) RIGHT EYHANCE TORIC  6.10  00:35.9;  Surgeon: Eulogio Bear, MD;  Location: [place];  Service: Ophthalmology;  Laterality: Right;   CATARACT EXTRACTION W/PHACO Left 01/30/2023   Procedure: CATARACT EXTRACTION PHACO AND INTRAOCULAR LENS PLACEMENT (IOC) LEFT EYHANCE TORIC 4.81 00:28.2;  Surgeon: Eulogio Bear, MD;  Location: [facility];  Service: Ophthalmology;   Laterality: Left;   COLONOSCOPY  2015   Large adenoma removed laparoscopically   COLONOSCOPY WITH PROPOFOL N/A 11/06/2017   Procedure: COLONOSCOPY WITH PROPOFOL;  Surgeon: Lucilla Lame, MD;  Location: [place];  Service: Endoscopy;  Laterality: N/A;   LAPAROSCOPIC ILEOCECECTOMY  2015   Serrated adenoma   TONSILLECTOMY      Current Medications: Current Meds  Medication Sig   buPROPion (WELLBUTRIN XL) 150 MG 24 hr tablet TAKE 1 TABLET(150 MG) BY MOUTH DAILY   levothyroxine (SYNTHROID) 25 MCG tablet Take 25 mcg by mouth daily before breakfast.   metoprolol tartrate (LOPRESSOR) 100 MG tablet Take one tablet (129m) by mouth two hours prior to cardiac CTA   oxybutynin (DITROPAN) 5 MG tablet TAKE 1 TABLET(5 MG) BY MOUTH DAILY   pravastatin (PRAVACHOL) 10 MG tablet Take 1 tablet (10 mg total) by mouth daily.   [DISCONTINUED] losartan (COZAAR) 25 MG tablet Take 1 tablet (25 mg total) by mouth daily.     Allergies:   Lipitor [atorvastatin], Naproxen, Other, Penicillins, and Pneumovax 23 [pneumococcal vac polyvalent]   Social History   Socioeconomic History   Marital status: Married    Spouse name: Not on file   Number of children: 4   Years of education: Not on file   Highest education level: 12th grade  Occupational History   Occupation: Retired  Tobacco Use   Smoking status: Former    Packs/day: 0.25    Years: 10.00    Total  pack years: 2.50    Types: Cigarettes    Quit date: 1970    Years since quitting: 54.1   Smokeless tobacco: Never   Tobacco comments:    smoking cessation materials not required  Vaping Use   Vaping Use: Never used  Substance and Sexual Activity   Alcohol use: Yes    Comment: occasionally   Drug use: Never   Sexual activity: Not Currently  Other Topics Concern   Not on file  Social History Narrative   Not on file   Social Determinants of Health   Financial Resource Strain: Low Risk  (09/14/2022)   Overall Financial Resource Strain  (CARDIA)    Difficulty of Paying Living Expenses: Not hard at all  Food Insecurity: No Food Insecurity (09/14/2022)   Hunger Vital Sign    Worried About Running Out of Food in the Last Year: Never true    Ran Out of Food in the Last Year: Never true  Transportation Needs: No Transportation Needs (09/14/2022)   PRAPARE - Hydrologist (Medical): No    Lack of Transportation (Non-Medical): No  Physical Activity: Sufficiently Active (09/14/2022)   Exercise Vital Sign    Days of Exercise per Week: 3 days    Minutes of Exercise per Session: 60 min  Stress: No Stress Concern Present (09/14/2022)   [place]    Feeling of Stress : Only a little  Social Connections: Moderately Integrated (09/14/2022)   Social Connection and Isolation Panel [NHANES]    Frequency of Communication with Friends and Family: More than three times a week    Frequency of Social Gatherings with Friends and Family: Once a week    Attends Religious Services: Never    Marine scientist or Organizations: Yes    Attends Music therapist: More than 4 times per year    Marital Status: Married     Family History: The patient's family history includes CAD in her brother and father; Cardiomyopathy in her brother and father; Diabetes in her brother and mother.  ROS:   Please see the history of present illness.     All other systems reviewed and are negative.  EKGs/Labs/Other Studies Reviewed:    The following studies were reviewed today:   EKG:  EKG not ordered today.  Recent Labs: 10/26/2022: ALT 13; Hemoglobin 14.2; Platelets 315 12/30/2022: BUN 15; Creatinine, Ser 0.63; Potassium 3.8; Sodium 132  Recent Lipid Panel    Component Value Date/Time   CHOL 221 (H) 10/26/2022 0836   TRIG 81 10/26/2022 0836   HDL 71 10/26/2022 0836   CHOLHDL 3.1 10/26/2022 0836   LDLCALC 136 (H) 10/26/2022 0836     Risk  Assessment/Calculations:               Physical Exam:    VS:  BP 138/78 (BP Location: Left Arm, Patient Position: Sitting, Cuff Size: Normal)   Pulse 68   Ht 5' 6"$  (1.676 m)   Wt 140 lb 9.6 oz (63.8 kg)   SpO2 100%   BMI 22.69 kg/m     Wt Readings from Last 3 Encounters:  02/10/23 140 lb 9.6 oz (63.8 kg)  01/30/23 139 lb 1.6 oz (63.1 kg)  01/16/23 140 lb (63.5 kg)     GEN:  Well nourished, well developed in no acute distress HEENT: Normal NECK: No JVD; No carotid bruits CARDIAC: RRR, no murmurs, rubs, gallops RESPIRATORY:  Clear  to auscultation without rales, wheezing or rhonchi  ABDOMEN: Soft, non-tender, non-distended MUSCULOSKELETAL:  No edema; No deformity  SKIN: Warm and dry NEUROLOGIC:  Alert and oriented x 3 PSYCHIATRIC:  Normal affect   ASSESSMENT:    1. Ejection fraction < 50%   2. Primary hypertension   3. Mitral valve insufficiency, unspecified etiology    PLAN:    In order of problems listed above:  Mildly reduced ejection fraction, EF 45 to 50%.  Coronary CT with no CAD.  Denies chest pain or shortness of breath.  Increase losartan to 50 mg daily, continue Toprol-XL 25 mg daily. Hypertension, BP elevated.  Increase losartan to 50 mg.  Continue Toprol-XL.   Mild to moderate MR, monitor serially with echoes.  Follow-up in 4 months.     Medication Adjustments/Labs and Tests Ordered: Current medicines are reviewed at length with the patient today.  Concerns regarding medicines are outlined above.  No orders of the defined types were placed in this encounter.  Meds ordered this encounter  Medications   losartan (COZAAR) 50 MG tablet    Sig: Take 1 tablet (50 mg total) by mouth daily.    Dispense:  90 tablet    Refill:  3    Patient Instructions  Medication Instructions:   INCREASE Losartan - take one tablet (50 mg) by mouth daily.   *If you need a refill on your cardiac medications before your next appointment, please call your  pharmacy*   Lab Work:  None Ordered  If you have labs (blood work) drawn today and your tests are completely normal, you will receive your results only by: [place] (if you have MyChart) OR A paper copy in the mail If you have any lab test that is abnormal or we need to change your treatment, we will call you to review the results.   Testing/Procedures:  None Ordered   Follow-Up: At [facility], you and your health needs are our priority.  As part of our continuing mission to provide you with exceptional heart care, we have created designated Provider Care Teams.  These Care Teams include your primary Cardiologist (physician) and Advanced Practice Providers (APPs -  Physician Assistants and Nurse Practitioners) who all work together to provide you with the care you need, when you need it.  We recommend signing up for the patient portal called "MyChart".  Sign up information is provided on this After Visit Summary.  MyChart is used to connect with patients for Virtual Visits (Telemedicine).  Patients are able to view lab/test results, encounter notes, upcoming appointments, etc.  Non-urgent messages can be sent to your provider as well.   To learn more about what you can do with MyChart, go to [email or web address].    Your next appointment:   4 month(s)  Provider:   You may see Kate Sable, MD or one of the following Advanced Practice Providers on your designated Care Team:   Murray Hodgkins, NP Christell Faith, PA-C Cadence Kathlen Mody, PA-C Gerrie Nordmann, NP   Signed, Kate Sable, MD  02/10/2023 9:55 AM    [place]

## 2023-02-10 NOTE — Patient Instructions (Signed)
Medication Instructions:   INCREASE Losartan - take one tablet (50 mg) by mouth daily.   *If you need a refill on your cardiac medications before your next appointment, please call your pharmacy*   Lab Work:  None Ordered  If you have labs (blood work) drawn today and your tests are completely normal, you will receive your results only by: [place] (if you have MyChart) OR A paper copy in the mail If you have any lab test that is abnormal or we need to change your treatment, we will call you to review the results.   Testing/Procedures:  None Ordered   Follow-Up: At [facility], you and your health needs are our priority.  As part of our continuing mission to provide you with exceptional heart care, we have created designated Provider Care Teams.  These Care Teams include your primary Cardiologist (physician) and Advanced Practice Providers (APPs -  Physician Assistants and Nurse Practitioners) who all work together to provide you with the care you need, when you need it.  We recommend signing up for the patient portal called "MyChart".  Sign up information is provided on this After Visit Summary.  MyChart is used to connect with patients for Virtual Visits (Telemedicine).  Patients are able to view lab/test results, encounter notes, upcoming appointments, etc.  Non-urgent messages can be sent to your provider as well.   To learn more about what you can do with MyChart, go to [email or web address].    Your next appointment:   4 month(s)  Provider:   You may see Kate Sable, MD or one of the following Advanced Practice Providers on your designated Care Team:   Murray Hodgkins, NP Christell Faith, PA-C Cadence Kathlen Mody, PA-C Gerrie Nordmann, NP

## 2023-02-21 DIAGNOSIS — Z0100 Encounter for examination of eyes and vision without abnormal findings: Secondary | ICD-10-CM | POA: Diagnosis not present

## 2023-02-24 DIAGNOSIS — Z0100 Encounter for examination of eyes and vision without abnormal findings: Secondary | ICD-10-CM | POA: Diagnosis not present

## 2023-04-21 ENCOUNTER — Other Ambulatory Visit: Payer: Self-pay | Admitting: Internal Medicine

## 2023-04-21 DIAGNOSIS — E782 Mixed hyperlipidemia: Secondary | ICD-10-CM

## 2023-04-21 NOTE — Telephone Encounter (Signed)
Requested Prescriptions  Pending Prescriptions Disp Refills   pravastatin (PRAVACHOL) 10 MG tablet [Pharmacy Med Name: PRAVASTATIN 10MG  TABLETS] 90 tablet 1    Sig: TAKE 1 TABLET(10 MG) BY MOUTH DAILY     Cardiovascular:  Antilipid - Statins Failed - 04/21/2023  6:20 AM      Failed - Lipid Panel in normal range within the last 12 months    Cholesterol, Total  Date Value Ref Range Status  10/26/2022 221 (H) 100 - 199 mg/dL Final   LDL Chol Calc (NIH)  Date Value Ref Range Status  10/26/2022 136 (H) 0 - 99 mg/dL Final   HDL  Date Value Ref Range Status  10/26/2022 71 >39 mg/dL Final   Triglycerides  Date Value Ref Range Status  10/26/2022 81 0 - 149 mg/dL Final         Passed - Patient is not pregnant      Passed - Valid encounter within last 12 months    Recent Outpatient Visits           5 months ago Annual physical exam   [place] [facility] at [facility], Nyoka Cowden, MD   7 months ago Essential hypertension   [place] [facility] at [facility], Nyoka Cowden, MD   1 year ago Annual physical exam   [facility] at [facility], Nyoka Cowden, MD   2 years ago Annual physical exam   [facility] at [facility], Nyoka Cowden, MD   3 years ago Dyspepsia   [facility] at [facility], Nyoka Cowden, MD       Future Appointments             In 6 days Judithann Graves, Nyoka Cowden, MD [facility] at [facility], [facility]   In 1 month Debbe Odea, MD [facility] at [place]   In 6 months Judithann Graves, Nyoka Cowden, MD [facility] Medicine at [facility], [facility]

## 2023-04-26 DIAGNOSIS — I781 Nevus, non-neoplastic: Secondary | ICD-10-CM | POA: Diagnosis not present

## 2023-04-26 DIAGNOSIS — L821 Other seborrheic keratosis: Secondary | ICD-10-CM | POA: Diagnosis not present

## 2023-04-26 DIAGNOSIS — L570 Actinic keratosis: Secondary | ICD-10-CM | POA: Diagnosis not present

## 2023-04-27 ENCOUNTER — Ambulatory Visit (INDEPENDENT_AMBULATORY_CARE_PROVIDER_SITE_OTHER): Payer: Medicare HMO | Admitting: Internal Medicine

## 2023-04-27 ENCOUNTER — Encounter: Payer: Self-pay | Admitting: Internal Medicine

## 2023-04-27 VITALS — BP 138/84 | HR 69 | Ht 66.0 in | Wt 140.0 lb

## 2023-04-27 DIAGNOSIS — E782 Mixed hyperlipidemia: Secondary | ICD-10-CM

## 2023-04-27 DIAGNOSIS — F325 Major depressive disorder, single episode, in full remission: Secondary | ICD-10-CM | POA: Diagnosis not present

## 2023-04-27 DIAGNOSIS — I10 Essential (primary) hypertension: Secondary | ICD-10-CM | POA: Diagnosis not present

## 2023-04-27 DIAGNOSIS — Z1211 Encounter for screening for malignant neoplasm of colon: Secondary | ICD-10-CM

## 2023-04-27 DIAGNOSIS — I340 Nonrheumatic mitral (valve) insufficiency: Secondary | ICD-10-CM

## 2023-04-27 MED ORDER — LOSARTAN POTASSIUM 100 MG PO TABS: 50.0000 mg | ORAL_TABLET | Freq: Every day | ORAL | 3 refills | Status: DC

## 2023-04-27 NOTE — Assessment & Plan Note (Signed)
Clinically stable on current regimen with good control of symptoms, No SI or HI. No change in management at this time. 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

## 2023-04-27 NOTE — Assessment & Plan Note (Signed)
Started Pravachol 10 in October. No current side effects.

## 2023-04-27 NOTE — Assessment & Plan Note (Addendum)
Stable exam with mildly elevated BP.  Currently taking losartan 50 mg and metoprolol. Losartan increased in February by Cardiology. Tolerating medications without concerns or side effects.

## 2023-04-27 NOTE — Patient Instructions (Signed)
-  It was a pleasure to see you today! Please review your visit summary for helpful information. -Lab results are usually available within 1-2 days and we will call once reviewed. -I would encourage you to follow your care via MyChart where you can access lab results, notes, messages, and more. -If you feel that we did a nice job today, please complete your after-visit survey and leave us a Google review! Your CMA today was Daphne Karrer and your provider was Dr Laura Berglund, MD.  

## 2023-04-27 NOTE — Progress Notes (Signed)
Date:  04/27/2023   Name:  RAMONITA KOENIG   DOB:  28-Jul-1946   MRN:  [id number]   Chief Complaint: Hypertension  Hypertension This is a chronic problem. The problem is controlled. Pertinent negatives include no chest pain, headaches, palpitations or shortness of breath. Past treatments include angiotensin blockers and beta blockers (losartan increased last visit). The current treatment provides significant improvement. There is no history of kidney disease, CAD/MI or CVA.  Hyperlipidemia This is a chronic problem. Pertinent negatives include no chest pain or shortness of breath. Current antihyperlipidemic treatment includes statins (started pravachol last visit).  MR - seen by cardiology; mild- moderate by ECHO.  EF mildly reduced 45%. Losartan increased to 50 mg.  Lab Results  Component Value Date   NA 132 (L) 12/30/2022   K 3.8 12/30/2022   CO2 29 12/30/2022   GLUCOSE 85 12/30/2022   BUN 15 12/30/2022   CREATININE 0.63 12/30/2022   CALCIUM 8.8 (L) 12/30/2022   EGFR 83 10/26/2022   GFRNONAA >60 12/30/2022   Lab Results  Component Value Date   CHOL 221 (H) 10/26/2022   HDL 71 10/26/2022   LDLCALC 136 (H) 10/26/2022   TRIG 81 10/26/2022   CHOLHDL 3.1 10/26/2022   Lab Results  Component Value Date   TSH 1.870 10/21/2020   No results found for: "HGBA1C" Lab Results  Component Value Date   WBC 5.3 10/26/2022   HGB 14.2 10/26/2022   HCT 41.9 10/26/2022   MCV 93 10/26/2022   PLT 315 10/26/2022   Lab Results  Component Value Date   ALT 13 10/26/2022   AST 17 10/26/2022   ALKPHOS 91 10/26/2022   BILITOT 0.4 10/26/2022   Lab Results  Component Value Date   VD25OH 62.0 10/25/2021     Review of Systems  Constitutional:  Negative for chills, fatigue and unexpected weight change.  HENT:  Negative for nosebleeds.   Eyes:  Negative for visual disturbance.  Respiratory:  Negative for cough, chest tightness, shortness of breath and wheezing.   Cardiovascular:   Negative for chest pain, palpitations and leg swelling.  Gastrointestinal:  Negative for abdominal pain, blood in stool, constipation and diarrhea.  Musculoskeletal:  Negative for arthralgias and gait problem.  Neurological:  Negative for dizziness, weakness, light-headedness and headaches.  Psychiatric/Behavioral:  Negative for dysphoric mood and sleep disturbance. The patient is not nervous/anxious.     Patient Active Problem List   Diagnosis Date Noted   Mitral regurgitation 04/27/2023   Essential hypertension 04/27/2023   Nontoxic single thyroid nodule 09/16/2019   Mass of skin of left hand 04/28/2018   Osteoporosis of multiple sites without pathological fracture 10/21/2016   Urge incontinence of urine 09/11/2015   Major depression, single episode, in complete remission (HCC) 09/11/2015   Mixed hyperlipidemia 09/11/2015   Irritable bowel syndrome without diarrhea 09/11/2015   Hx of adenomatous colonic polyps 08/27/2014   Adhesive capsulitis 03/26/2014   Disequilibrium 06/13/2008    Allergies  Allergen Reactions   Lipitor [Atorvastatin] Other (See Comments)    Leg pain   Naproxen Rash   Other Rash    OTC "cold pills" cause rash.  Liquid meds are OK   Penicillins Rash   Pneumovax 23 [Pneumococcal Vac Polyvalent] Rash    Past Surgical History:  Procedure Laterality Date   APPENDECTOMY     BIOPSY THYROID  2020   BUNIONECTOMY Left 02/27/2019   Procedure: DOUBLE OSTECTOMY;  Surgeon: Gwyneth Revels, DPM;  [facility];  Service: Podiatry;  Laterality: Left;  GENERAL WITH LOCAL   CATARACT EXTRACTION W/PHACO Right 01/16/2023   Procedure: CATARACT EXTRACTION PHACO AND INTRAOCULAR LENS PLACEMENT ([facility]) RIGHT EYHANCE TORIC  6.10  00:35.9;  Surgeon: [place] Crane, MD;  Location: [facility];  Service: Ophthalmology;  Laterality: Right;   CATARACT EXTRACTION W/PHACO Left 01/30/2023   Procedure: CATARACT EXTRACTION PHACO AND INTRAOCULAR LENS PLACEMENT (I[facility])  LEFT EYHANCE TORIC 4.81 00:28.2;  Surgeon: [place] Crane, MD;  Location[facility];  Service: Ophthalmology;  Laterality: Left;   COLONOSCOPY  2015   Large adenoma removed laparoscopically   COLONOSCOPY WITH PROPOFOL N/A 11/06/2017   Procedure: COLONOSCOPY WITH PROPOFOL;  Surgeon: Midge Minium, MD;  Location: [facility];  Service: Endoscopy;  Laterality: N/A;   LAPAROSCOPIC ILEOCECECTOMY  2015   Serrated adenoma   TONSILLECTOMY      Social History   Tobacco Use   Smoking status: Former    Packs/day: 0.25    Years: 10.00    Additional pack years: 0.00    Total pack years: 2.50    Types: Cigarettes    Quit date: 1970    Years since quitting: 54.3   Smokeless tobacco: Never   Tobacco comments:    smoking cessation materials not required  Vaping Use   Vaping Use: Never used  Substance Use Topics   Alcohol use: Yes    Comment: occasionally   Drug use: Never     Medication list has been reviewed and updated.  Current Meds  Medication Sig   buPROPion (WELLBUTRIN XL) 150 MG 24 hr tablet TAKE 1 TABLET(150 MG) BY MOUTH DAILY   levothyroxine (SYNTHROID) 25 MCG tablet Take 25 mcg by mouth daily before breakfast.   metoprolol tartrate (LOPRESSOR) 25 MG tablet Take 25 mg by mouth daily.   oxybutynin (DITROPAN) 5 MG tablet TAKE 1 TABLET(5 MG) BY MOUTH DAILY   pravastatin (PRAVACHOL) 10 MG tablet TAKE 1 TABLET(10 MG) BY MOUTH DAILY   [DISCONTINUED] losartan (COZAAR) 50 MG tablet Take 1 tablet (50 mg total) by mouth daily.       04/27/2023    8:06 AM 10/26/2022    8:05 AM 09/14/2022    8:09 AM 10/25/2021    8:06 AM  GAD 7 : Generalized Anxiety Score  Nervous, Anxious, on Edge 0 0 1 0  Control/stop worrying 0 0 0 0  Worry too much - different things 1 0 1 0  Trouble relaxing 1 0 0 0  Restless 0 0 0 0  Easily annoyed or irritable 0 0 0 0  Afraid - awful might happen 0 0 0 0  Total GAD 7 Score 2 0 2 0  Anxiety Difficulty Not difficult at all Not difficult  at all Not difficult at all Not difficult at all       04/27/2023    8:06 AM 10/26/2022    8:05 AM 09/14/2022    8:09 AM  Depression screen PHQ 2/9  Decreased Interest 0 0 0  Down, Depressed, Hopeless 0 0 0  PHQ - 2 Score 0 0 0  Altered sleeping 1 0 0  Tired, decreased energy 1 0 1  Change in appetite 0 0 0  Feeling bad or failure about yourself  0 0 0  Trouble concentrating 0 0 0  Moving slowly or fidgety/restless 0 0 0  Suicidal thoughts 0 0 0  PHQ-9 Score 2 0 1  Difficult doing work/chores Not difficult at all Not difficult at all  Not difficult at all    BP Readings from Last 3 Encounters:  04/27/23 138/84  02/10/23 138/78  01/30/23 (!) 149/75    Physical Exam Vitals and nursing note reviewed.  Constitutional:      General: She is not in acute distress.    Appearance: She is well-developed.  HENT:     Head: Normocephalic and atraumatic.  Cardiovascular:     Rate and Rhythm: Normal rate and regular rhythm.  Pulmonary:     Effort: Pulmonary effort is normal. No respiratory distress.  Musculoskeletal:     Cervical back: Normal range of motion.     Right lower leg: No edema.     Left lower leg: No edema.  Lymphadenopathy:     Cervical: No cervical adenopathy.  Skin:    General: Skin is warm and dry.     Capillary Refill: Capillary refill takes less than 2 seconds.     Findings: No rash.  Neurological:     General: No focal deficit present.     Mental Status: She is alert and oriented to person, place, and time.  Psychiatric:        Mood and Affect: Mood normal.        Behavior: Behavior normal.     Wt Readings from Last 3 Encounters:  04/27/23 140 lb (63.5 kg)  02/10/23 140 lb 9.6 oz (63.8 kg)  01/30/23 139 lb 1.6 oz (63.1 kg)    BP 138/84 (BP Location: Right Arm, Cuff Size: Normal)   Pulse 69   Ht 5\' 6"  (1.676 m)   Wt 140 lb (63.5 kg)   SpO2 95%   BMI 22.60 kg/m   Assessment and Plan:  Problem List Items Addressed This Visit        Cardiovascular and Mediastinum   Essential hypertension    Stable exam with mildly elevated BP.  Currently taking losartan 50 mg and metoprolol. Losartan increased in February by Cardiology. Tolerating medications without concerns or side effects.        Relevant Medications   metoprolol tartrate (LOPRESSOR) 25 MG tablet   losartan (COZAAR) 100 MG tablet   Other Relevant Orders   Comprehensive metabolic panel   Mitral regurgitation - Primary   Relevant Medications   metoprolol tartrate (LOPRESSOR) 25 MG tablet   losartan (COZAAR) 100 MG tablet     Other   Major depression, single episode, in complete remission (HCC) (Chronic)    Clinically stable on current regimen with good control of symptoms, No SI or HI. No change in management at this time. 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      Mixed hyperlipidemia (Chronic)    Started Pravachol 10 in October. No current side effects.      Relevant Medications   metoprolol tartrate (LOPRESSOR) 25 MG tablet   losartan (COZAAR) 100 MG tablet   Other Relevant Orders   Comprehensive metabolic panel   Lipid panel   Other Visit Diagnoses     Colon cancer screening       Relevant Orders   Ambulatory referral to Gastroenterology       No follow-ups on file.   Partially dictated using Dragon software, any errors are not intentional.  Reubin Milan, MD [facility] Medicine [place], [place]

## 2023-04-28 LAB — COMPREHENSIVE METABOLIC PANEL
ALT: 14 IU/L (ref 0–32)
AST: 15 IU/L (ref 0–40)
Albumin/Globulin Ratio: 1.8 (ref 1.2–2.2)
Albumin: 4.3 g/dL (ref 3.8–4.8)
Alkaline Phosphatase: 83 IU/L (ref 44–121)
BUN/Creatinine Ratio: 13 (ref 12–28)
BUN: 10 mg/dL (ref 8–27)
Bilirubin Total: 0.4 mg/dL (ref 0.0–1.2)
CO2: 25 mmol/L (ref 20–29)
Calcium: 9.3 mg/dL (ref 8.7–10.3)
Chloride: 103 mmol/L (ref 96–106)
Creatinine, Ser: 0.77 mg/dL (ref 0.57–1.00)
Globulin, Total: 2.4 g/dL (ref 1.5–4.5)
Glucose: 92 mg/dL (ref 70–99)
Potassium: 4.9 mmol/L (ref 3.5–5.2)
Sodium: 141 mmol/L (ref 134–144)
Total Protein: 6.7 g/dL (ref 6.0–8.5)
eGFR: 80 mL/min/{1.73_m2} (ref >59)

## 2023-04-28 LAB — LIPID PANEL
Chol/HDL Ratio: 2.5 ratio (ref 0.0–4.4)
Cholesterol, Total: 174 mg/dL (ref 100–199)
HDL: 69 mg/dL (ref >39)
LDL Chol Calc (NIH): 87 mg/dL (ref 0–99)
Triglycerides: 98 mg/dL (ref 0–149)
VLDL Cholesterol Cal: 18 mg/dL (ref 5–40)

## 2023-05-03 ENCOUNTER — Other Ambulatory Visit: Payer: Self-pay

## 2023-05-03 ENCOUNTER — Telehealth: Payer: Self-pay

## 2023-05-03 DIAGNOSIS — Z86010 Personal history of colonic polyps: Secondary | ICD-10-CM

## 2023-05-03 MED ORDER — NA SULFATE-K SULFATE-MG SULF 17.5-3.13-1.6 GM/177ML PO SOLN: 1.0000 | Freq: Once | ORAL | 0 refills | Status: AC

## 2023-05-03 NOTE — Telephone Encounter (Signed)
Gastroenterology Pre-Procedure Review  Request Date: 06/26/23 Requesting Physician: Dr. Servando Snare  PATIENT REVIEW QUESTIONS: The patient responded to the following health history questions as indicated:    1. Are you having any GI issues? no 2. Do you have a personal history of Polyps? yes (last colonoscopy 11/06/2017 with Dr. Servando Snare) 3. Do you have a family history of Colon Cancer or Polyps? no 4. Diabetes Mellitus? no 5. Joint replacements in the past 12 months?no 6. Major health problems in the past 3 months?no 7. Any artificial heart valves, MVP, or defibrillator?no    MEDICATIONS & ALLERGIES:    Patient reports the following regarding taking any anticoagulation/antiplatelet therapy:   Plavix, Coumadin, Eliquis, Xarelto, Lovenox, Pradaxa, Brilinta, or Effient? no Aspirin? no  Patient confirms/reports the following medications:  Current Outpatient Medications  Medication Sig Dispense Refill   buPROPion (WELLBUTRIN XL) 150 MG 24 hr tablet TAKE 1 TABLET(150 MG) BY MOUTH DAILY 90 tablet 3   levothyroxine (SYNTHROID) 25 MCG tablet Take 25 mcg by mouth daily before breakfast.     losartan (COZAAR) 100 MG tablet Take 0.5 tablets (50 mg total) by mouth daily. 90 tablet 3   metoprolol tartrate (LOPRESSOR) 25 MG tablet Take 25 mg by mouth daily.     oxybutynin (DITROPAN) 5 MG tablet TAKE 1 TABLET(5 MG) BY MOUTH DAILY 90 tablet 3   pravastatin (PRAVACHOL) 10 MG tablet TAKE 1 TABLET(10 MG) BY MOUTH DAILY 90 tablet 1   No current facility-administered medications for this visit.    Patient confirms/reports the following allergies:  Allergies  Allergen Reactions   Lipitor [Atorvastatin] Other (See Comments)    Leg pain   Naproxen Rash   Other Rash    OTC "cold pills" cause rash.  Liquid meds are OK   Penicillins Rash   Pneumovax 23 [Pneumococcal Vac Polyvalent] Rash    No orders of the defined types were placed in this encounter.   AUTHORIZATION INFORMATION Primary  Insurance: 1D#: Group #:  Secondary Insurance: 1D#: Group #:  SCHEDULE INFORMATION: Date: 06/26/23 Time: Location: [facility]

## 2023-06-12 ENCOUNTER — Encounter: Payer: Self-pay | Admitting: Cardiology

## 2023-06-12 ENCOUNTER — Ambulatory Visit: Payer: Medicare HMO | Attending: Cardiology | Admitting: Cardiology

## 2023-06-12 VITALS — BP 124/80 | HR 63 | Ht 66.0 in | Wt 139.0 lb

## 2023-06-12 DIAGNOSIS — I10 Essential (primary) hypertension: Secondary | ICD-10-CM | POA: Diagnosis not present

## 2023-06-12 DIAGNOSIS — I340 Nonrheumatic mitral (valve) insufficiency: Secondary | ICD-10-CM

## 2023-06-12 DIAGNOSIS — R9430 Abnormal result of cardiovascular function study, unspecified: Secondary | ICD-10-CM | POA: Diagnosis not present

## 2023-06-12 MED ORDER — CARVEDILOL 6.25 MG PO TABS: 6.2500 mg | ORAL_TABLET | Freq: Two times a day (BID) | ORAL | 3 refills | Status: DC

## 2023-06-12 NOTE — Progress Notes (Signed)
Cardiology Office Note:    Date:  06/12/2023   ID:  Krista Peters, DOB 12-10-1946, MRN [id number]  PCP:  Reubin Milan, MD   [facility] Providers Cardiologist:  Debbe Odea, MD     Referring MD: Reubin Milan, MD   Chief Complaint  Patient presents with   Follow-up    Patient denies new or acute cardiac problems/concerns today.  Is concerned of increasing blood pressure and brings in a list of home bp readings.      History of Present Illness:    Krista Peters is a 77 y.o. female with a hx of hypertension, hyperlipidemia, mildly reduced EF 45 to 50%, mild to moderate MR, hypothyroidism who presents for follow-up.  Was previously prescribed Toprol-XL due to mildly reduced EF.  However, she is currently on Lopressor. States BP at home have been elevated of late, systolics in the 140s.  Losartan was increased to 100 mg daily by PCP.  Denies chest pain or shortness of breath.  Prior notes Echocardiogram  12//2020. mild to moderate MR, EF 45 to 50%. Coronary CT 2/24, no evidence of CAD.  Past Medical History:  Diagnosis Date   Depression    Hearing aid worn    bilateral   Hypertension    Thyroid disease     Past Surgical History:  Procedure Laterality Date   APPENDECTOMY     BIOPSY THYROID  2020   BUNIONECTOMY Left 02/27/2019   Procedure: DOUBLE OSTECTOMY;  Surgeon: Gwyneth Revels, DPM;  Location: [facility];  Service: Podiatry;  Laterality: Left;  GENERAL WITH LOCAL   CATARACT EXTRACTION W/PHACO Right 01/16/2023   Procedure: CATARACT EXTRACTION PHACO AND INTRAOCULAR LENS PLACEMENT (IOC) RIGHT EYHANCE TORIC  6.10  00:35.9;  Surgeon: Nevada Crane, MD;  Location: [facility];  Service: Ophthalmology;  Laterality: Right;   CATARACT EXTRACTION W/PHACO Left 01/30/2023   Procedure: CATARACT EXTRACTION PHACO AND INTRAOCULAR LENS PLACEMENT (IOC) LEFT EYHANCE TORIC 4.81 00:28.2;  Surgeon: Nevada Crane, MD;  Location: [facility];  Service: Ophthalmology;  Laterality: Left;   COLONOSCOPY  2015   Large adenoma removed laparoscopically   COLONOSCOPY WITH PROPOFOL N/A 11/06/2017   Procedure: COLONOSCOPY WITH PROPOFOL;  Surgeon: Midge Minium, MD;  Location: [facility];  Service: Endoscopy;  Laterality: N/A;   LAPAROSCOPIC ILEOCECECTOMY  2015   Serrated adenoma   TONSILLECTOMY      Current Medications: Current Meds  Medication Sig   buPROPion (WELLBUTRIN XL) 150 MG 24 hr tablet TAKE 1 TABLET(150 MG) BY MOUTH DAILY   carvedilol (COREG) 6.25 MG tablet Take 1 tablet (6.25 mg total) by mouth 2 (two) times daily.   levothyroxine (SYNTHROID) 25 MCG tablet Take 25 mcg by mouth daily before breakfast.   losartan (COZAAR) 100 MG tablet Take 100 mg by mouth daily.   oxybutynin (DITROPAN) 5 MG tablet TAKE 1 TABLET(5 MG) BY MOUTH DAILY   pravastatin (PRAVACHOL) 10 MG tablet TAKE 1 TABLET(10 MG) BY MOUTH DAILY   [DISCONTINUED] losartan (COZAAR) 100 MG tablet Take 0.5 tablets (50 mg total) by mouth daily. (Patient taking differently: Take 100 mg by mouth daily.)   [DISCONTINUED] metoprolol tartrate (LOPRESSOR) 25 MG tablet Take 25 mg by mouth daily.     Allergies:   Lipitor [atorvastatin], Naproxen, Other, Penicillins, and Pneumovax 23 [pneumococcal vac polyvalent]   Social History   Socioeconomic History   Marital status: Married    Spouse name: Not on file   Number of  children: 4   Years of education: Not on file   Highest education level: 12th grade  Occupational History   Occupation: Retired  Tobacco Use   Smoking status: Former    Packs/day: 0.25    Years: 10.00    Additional pack years: 0.00    Total pack years: 2.50    Types: Cigarettes    Quit date: 1970    Years since quitting: 54.4   Smokeless tobacco: Never   Tobacco comments:    smoking cessation materials not required  Vaping Use   Vaping Use: Never used  Substance and Sexual Activity   Alcohol use: Yes    Comment: occasionally    Drug use: Never   Sexual activity: Not Currently  Other Topics Concern   Not on file  Social History Narrative   Not on file   Social Determinants of Health   Financial Resource Strain: Low Risk  (04/24/2023)   Overall Financial Resource Strain (CARDIA)    Difficulty of Paying Living Expenses: Not hard at all  Food Insecurity: No Food Insecurity (04/24/2023)   Hunger Vital Sign    Worried About Running Out of Food in the Last Year: Never true    Ran Out of Food in the Last Year: Never true  Transportation Needs: No Transportation Needs (04/24/2023)   PRAPARE - Administrator, Civil Service (Medical): No    Lack of Transportation (Non-Medical): No  Physical Activity: Insufficiently Active (04/24/2023)   Exercise Vital Sign    Days of Exercise per Week: 3 days    Minutes of Exercise per Session: 20 min  Stress: Stress Concern Present (04/24/2023)   [employer] of Occupational Health - Occupational Stress Questionnaire    Feeling of Stress : To some extent  Social Connections: Moderately Integrated (04/24/2023)   Social Connection and Isolation Panel [NHANES]    Frequency of Communication with Friends and Family: More than three times a week    Frequency of Social Gatherings with Friends and Family: Three times a week    Attends Religious Services: Never    Active Member of Clubs or Organizations: No    Attends Engineer, structural: More than 4 times per year    Marital Status: Married     Family History: The patient's family history includes CAD in her brother and father; Cardiomyopathy in her brother and father; Diabetes in her brother and mother.  ROS:   Please see the history of present illness.     All other systems reviewed and are negative.  EKGs/Labs/Other Studies Reviewed:    The following studies were reviewed today:   EKG:  EKG not ordered today.  Recent Labs: 10/26/2022: Hemoglobin 14.2; Platelets 315 04/27/2023: ALT 14; BUN 10;  Creatinine, Ser 0.77; Potassium 4.9; Sodium 141  Recent Lipid Panel    Component Value Date/Time   CHOL 174 04/27/2023 0835   TRIG 98 04/27/2023 0835   HDL 69 04/27/2023 0835   CHOLHDL 2.5 04/27/2023 0835   LDLCALC 87 04/27/2023 0835     Risk Assessment/Calculations:               Physical Exam:    VS:  BP 124/80 (BP Location: Right Arm, Patient Position: Sitting, Cuff Size: Normal)   Pulse 63   Ht 5\' 6"  (1.676 m)   Wt 139 lb (63 kg)   SpO2 99%   BMI 22.44 kg/m     Wt Readings from Last 3 Encounters:  06/12/23 139 lb (  63 kg)  04/27/23 140 lb (63.5 kg)  02/10/23 140 lb 9.6 oz (63.8 kg)     GEN:  Well nourished, well developed in no acute distress HEENT: Normal NECK: No JVD; No carotid bruits CARDIAC: RRR, no murmurs, rubs, gallops RESPIRATORY:  Clear to auscultation without rales, wheezing or rhonchi  ABDOMEN: Soft, non-tender, non-distended MUSCULOSKELETAL:  No edema; No deformity  SKIN: Warm and dry NEUROLOGIC:  Alert and oriented x 3 PSYCHIATRIC:  Normal affect   ASSESSMENT:    1. Ejection fraction < 50%   2. Primary hypertension   3. Mitral valve insufficiency, unspecified etiology    PLAN:    In order of problems listed above:  Mildly reduced ejection fraction, EF 45 to 50%.  Coronary CT with no CAD.  Denies chest pain or shortness of breath.  Start Coreg 6.25 mg twice daily, continue losartan 100 mg daily. Hypertension, start Coreg 6.25 mg twice daily, continue losartan 100 mg daily.  Advised to check BP at home and keep log. Mild to moderate MR, monitor serially with echoes.  Follow-up in 4 to 6 weeks.     Medication Adjustments/Labs and Tests Ordered: Current medicines are reviewed at length with the patient today.  Concerns regarding medicines are outlined above.  No orders of the defined types were placed in this encounter.  Meds ordered this encounter  Medications   carvedilol (COREG) 6.25 MG tablet    Sig: Take 1 tablet (6.25 mg total)  by mouth 2 (two) times daily.    Dispense:  180 tablet    Refill:  3    Patient Instructions  Medication Instructions:   STOP Metoprolol Tartrate  START Carvedilol - Take one tablet ( 6.25mg  ) by mouth twice a day.   *If you need a refill on your cardiac medications before your next appointment, please call your pharmacy*   Lab Work:  None Ordered  If you have labs (blood work) drawn today and your tests are completely normal, you will receive your results only by: MyChart Message (if you have MyChart) OR A paper copy in the mail If you have any lab test that is abnormal or we need to change your treatment, we will call you to review the results.   Testing/Procedures:  None Ordered   Follow-Up: At [facility], you and your health needs are our priority.  As part of our continuing mission to provide you with exceptional heart care, we have created designated Provider Care Teams.  These Care Teams include your primary Cardiologist (physician) and Advanced Practice Providers (APPs -  Physician Assistants and Nurse Practitioners) who all work together to provide you with the care you need, when you need it.  We recommend signing up for the patient portal called "MyChart".  Sign up information is provided on this After Visit Summary.  MyChart is used to connect with patients for Virtual Visits (Telemedicine).  Patients are able to view lab/test results, encounter notes, upcoming appointments, etc.  Non-urgent messages can be sent to your provider as well.   To learn more about what you can do with MyChart, go to [email or web address].    Your next appointment:   4 - 6 weeks    Provider:   You may see Debbe Odea, MD or one of the following Advanced Practice Providers on your designated Care Team:   Nicolasa Ducking, NP Eula Listen, PA-C Cadence Fransico Michael, PA-C Charlsie Quest, NP    Signed, Debbe Odea, MD  06/12/2023 11:41 AM  [place]

## 2023-06-12 NOTE — Patient Instructions (Signed)
Medication Instructions:   STOP Metoprolol Tartrate  START Carvedilol - Take one tablet ( 6.25mg  ) by mouth twice a day.   *If you need a refill on your cardiac medications before your next appointment, please call your pharmacy*   Lab Work:  None Ordered  If you have labs (blood work) drawn today and your tests are completely normal, you will receive your results only by: MyChart Message (if you have MyChart) OR A paper copy in the mail If you have any lab test that is abnormal or we need to change your treatment, we will call you to review the results.   Testing/Procedures:  None Ordered   Follow-Up: At [facility], you and your health needs are our priority.  As part of our continuing mission to provide you with exceptional heart care, we have created designated Provider Care Teams.  These Care Teams include your primary Cardiologist (physician) and Advanced Practice Providers (APPs -  Physician Assistants and Nurse Practitioners) who all work together to provide you with the care you need, when you need it.  We recommend signing up for the patient portal called "MyChart".  Sign up information is provided on this After Visit Summary.  MyChart is used to connect with patients for Virtual Visits (Telemedicine).  Patients are able to view lab/test results, encounter notes, upcoming appointments, etc.  Non-urgent messages can be sent to your provider as well.   To learn more about what you can do with MyChart, go to [email or web address].    Your next appointment:   4 - 6 weeks    Provider:   You may see Debbe Odea, MD or one of the following Advanced Practice Providers on your designated Care Team:   Nicolasa Ducking, NP Eula Listen, PA-C Cadence Fransico Michael, PA-C Charlsie Quest, NP

## 2023-06-20 ENCOUNTER — Other Ambulatory Visit: Payer: Self-pay

## 2023-06-20 ENCOUNTER — Encounter: Payer: Self-pay | Admitting: Gastroenterology

## 2023-06-20 NOTE — Anesthesia Preprocedure Evaluation (Addendum)
Anesthesia Evaluation  Patient identified by MRN, date of birth, ID band Patient awake    Reviewed: Allergy & Precautions, H&P , NPO status , Patient's Chart, lab work & pertinent test results  Airway Mallampati: I  TM Distance: <3 FB Neck ROM: Full   Comment: Very short TMD Dental no notable dental hx.    Pulmonary neg pulmonary ROS, former smoker   Pulmonary exam normal breath sounds clear to auscultation       Cardiovascular hypertension, negative cardio ROS Normal cardiovascular exam Rhythm:Regular Rate:Normal  12-06-22  1. Left ventricular ejection fraction, by estimation, is 45 to 50%. The  left ventricle has mildly decreased function. The left ventricle  demonstrates global hypokinesis. Left ventricular diastolic parameters are  consistent with Grade I diastolic  dysfunction (impaired relaxation).   2. Right ventricular systolic function is normal. The right ventricular  size is normal.   3. The mitral valve is abnormal. Mild to moderate mitral valve  regurgitation. No evidence of mitral stenosis. There is mild late systolic  prolapse of both leaflets of the mitral valve.   4. Tricuspid valve regurgitation is mild to moderate.   5. The aortic valve is tricuspid. Aortic valve regurgitation is not  visualized. No aortic stenosis is present.     Neuro/Psych  PSYCHIATRIC DISORDERS  Depression    negative neurological ROS  negative psych ROS   GI/Hepatic negative GI ROS, Neg liver ROS,,,  Endo/Other  negative endocrine ROSHypothyroidism    Renal/GU negative Renal ROS  negative genitourinary   Musculoskeletal negative musculoskeletal ROS (+)    Abdominal   Peds negative pediatric ROS (+)  Hematology negative hematology ROS (+)   Anesthesia Other Findings Hearing aid worn  Thyroid disease Depression  Hypertension Weak heart (HCC)--EF 45-50% 12-06-22  Hypothyroidism    Reproductive/Obstetrics negative OB  ROS                              Anesthesia Physical Anesthesia Plan  ASA: 3  Anesthesia Plan: General   Post-op Pain Management:    Induction: Intravenous  PONV Risk Score and Plan:   Airway Management Planned: Natural Airway and Nasal Cannula  Additional Equipment:   Intra-op Plan:   Post-operative Plan:   Informed Consent: I have reviewed the patients History and Physical, chart, labs and discussed the procedure including the risks, benefits and alternatives for the proposed anesthesia with the patient or authorized representative who has indicated his/her understanding and acceptance.     Dental Advisory Given  Plan Discussed with: Anesthesiologist, CRNA and Surgeon  Anesthesia Plan Comments: (Patient consented for risks of anesthesia including but not limited to:  - adverse reactions to medications - risk of airway placement if required - damage to eyes, teeth, lips or other oral mucosa - nerve damage due to positioning  - sore throat or hoarseness - Damage to heart, brain, nerves, lungs, other parts of body or loss of life  Patient voiced understanding.)         Anesthesia Quick Evaluation

## 2023-06-26 ENCOUNTER — Ambulatory Visit
Admission: RE | Admit: 2023-06-26 | Discharge: 2023-06-26 | Disposition: A | Discharge status: Home / Self Care | Payer: Medicare HMO | Attending: Gastroenterology | Admitting: Gastroenterology

## 2023-06-26 ENCOUNTER — Ambulatory Visit: Payer: Medicare HMO | Admitting: Anesthesiology

## 2023-06-26 ENCOUNTER — Encounter: Payer: Self-pay | Admitting: Gastroenterology

## 2023-06-26 ENCOUNTER — Other Ambulatory Visit: Payer: Self-pay

## 2023-06-26 ENCOUNTER — Encounter
Admission: RE | Disposition: A | Discharge status: Home / Self Care | Payer: Self-pay | Source: Home / Self Care | Attending: Gastroenterology

## 2023-06-26 DIAGNOSIS — Z09 Encounter for follow-up examination after completed treatment for conditions other than malignant neoplasm: Secondary | ICD-10-CM | POA: Diagnosis not present

## 2023-06-26 DIAGNOSIS — Z1211 Encounter for screening for malignant neoplasm of colon: Secondary | ICD-10-CM | POA: Diagnosis not present

## 2023-06-26 DIAGNOSIS — Z860100 Personal history of colon polyps, unspecified: Secondary | ICD-10-CM

## 2023-06-26 DIAGNOSIS — Z87891 Personal history of nicotine dependence: Secondary | ICD-10-CM | POA: Diagnosis not present

## 2023-06-26 DIAGNOSIS — Z980 Intestinal bypass and anastomosis status: Secondary | ICD-10-CM | POA: Insufficient documentation

## 2023-06-26 DIAGNOSIS — I081 Rheumatic disorders of both mitral and tricuspid valves: Secondary | ICD-10-CM | POA: Insufficient documentation

## 2023-06-26 DIAGNOSIS — I110 Hypertensive heart disease with heart failure: Secondary | ICD-10-CM | POA: Insufficient documentation

## 2023-06-26 DIAGNOSIS — F32A Depression, unspecified: Secondary | ICD-10-CM | POA: Insufficient documentation

## 2023-06-26 DIAGNOSIS — E039 Hypothyroidism, unspecified: Secondary | ICD-10-CM | POA: Insufficient documentation

## 2023-06-26 DIAGNOSIS — I10 Essential (primary) hypertension: Secondary | ICD-10-CM | POA: Diagnosis not present

## 2023-06-26 HISTORY — PX: COLONOSCOPY WITH PROPOFOL: SHX5780

## 2023-06-26 HISTORY — DX: Other ill-defined heart diseases: I51.89

## 2023-06-26 HISTORY — DX: Hypothyroidism, unspecified: E03.9

## 2023-06-26 HISTORY — DX: Heart failure, unspecified: I50.9

## 2023-06-26 SURGERY — COLONOSCOPY WITH PROPOFOL
Anesthesia: General

## 2023-06-26 MED ORDER — LIDOCAINE HCL (CARDIAC) PF 100 MG/5ML IV SOSY
PREFILLED_SYRINGE | INTRAVENOUS | Status: DC | PRN
  Administered 2023-06-26: 60 mg via INTRAVENOUS

## 2023-06-26 MED ORDER — LACTATED RINGERS IV SOLN: INTRAVENOUS | Status: DC

## 2023-06-26 MED ORDER — STERILE WATER FOR IRRIGATION IR SOLN
Status: DC | PRN
  Administered 2023-06-26: 50 mL

## 2023-06-26 MED ORDER — SODIUM CHLORIDE 0.9 % IV SOLN: INTRAVENOUS | Status: DC

## 2023-06-26 MED ORDER — PROPOFOL 10 MG/ML IV BOLUS
INTRAVENOUS | Status: DC | PRN
  Administered 2023-06-26: 40 mg via INTRAVENOUS
  Administered 2023-06-26: 90 mg via INTRAVENOUS
  Administered 2023-06-26 (×2): 40 mg via INTRAVENOUS

## 2023-06-26 SURGICAL SUPPLY — 21 items

## 2023-06-26 NOTE — Anesthesia Postprocedure Evaluation (Signed)
Anesthesia Post Note  Patient: CAYMAN FORT  Procedure(s) Performed: COLONOSCOPY WITH PROPOFOL  Patient location during evaluation: PACU Anesthesia Type: General Level of consciousness: awake and alert Pain management: pain level controlled Vital Signs Assessment: post-procedure vital signs reviewed and stable Respiratory status: spontaneous breathing, nonlabored ventilation, respiratory function stable and patient connected to nasal cannula oxygen Cardiovascular status: blood pressure returned to baseline and stable Postop Assessment: no apparent nausea or vomiting Anesthetic complications: no   No notable events documented.   Last Vitals:  Vitals:   06/26/23 0840 06/26/23 0845  BP:  112/68  Pulse: (!) 107 91  Resp: (!) 26 16  Temp:  (!) 36.3 C  SpO2: 98% 100%    Last Pain:  Vitals:   06/26/23 0845  TempSrc:   PainSc: 0-No pain                 Sumaya Riedesel C Lidiya Reise

## 2023-06-26 NOTE — Op Note (Signed)
[facility] Patient Name: Krista Peters Procedure Date: 06/26/2023 8:07 AM MRN: [id number] Account #: [id number] Date of Birth: 1946/11/24 Admit Type: Outpatient Age: 77 Room: [facility] OR ROOM 01 Gender: Female Note Status: Finalized Instrument Name: [id number] Procedure:             Colonoscopy Indications:           High risk colon cancer surveillance: Personal history                         of colonic polyps Providers:             Midge Minium MD, MD Referring MD:          Bari Edward, MD (Referring MD) Medicines:             Propofol per Anesthesia Complications:         No immediate complications. Procedure:             Pre-Anesthesia Assessment:                        - Prior to the procedure, a History and Physical was                         performed, and patient medications and allergies were                         reviewed. The patient's tolerance of previous                         anesthesia was also reviewed. The risks and benefits                         of the procedure and the sedation options and risks                         were discussed with the patient. All questions were                         answered, and informed consent was obtained. Prior                         Anticoagulants: The patient has taken no anticoagulant                         or antiplatelet agents. ASA Grade Assessment: II - A                         patient with mild systemic disease. After reviewing                         the risks and benefits, the patient was deemed in                         satisfactory condition to undergo the procedure.                        After obtaining informed consent, the colonoscope was  passed under direct vision. Throughout the procedure,                         the patient's blood pressure, pulse, and oxygen                         saturations were monitored continuously. The                          Colonoscope was introduced through the anus and                         advanced to the the ileocolonic anastomosis. The                         colonoscopy was performed without difficulty. The                         patient tolerated the procedure well. The quality of                         the bowel preparation was excellent. Findings:      There was evidence of a prior end-to-end colo-colonic anastomosis in the       transverse colon. This was patent. Impression:            - Patent end-to-end colo-colonic anastomosis.                        - No specimens collected. Recommendation:        - Discharge patient to home.                        - Resume previous diet.                        - Continue present medications.                        - Repeat colonoscopy is not recommended for                         surveillance. Procedure Code(s):     --- Professional ---                        [phone], Colonoscopy, flexible; diagnostic, including                         collection of specimen(s) by brushing or washing, when                         performed (separate procedure) Diagnosis Code(s):     --- Professional ---                        Z86.010, Personal history of colonic polyps CPT copyright 2022 American Medical Association. All rights reserved. The codes documented in this report are preliminary and upon coder review may  be revised to meet current compliance requirements. Midge Minium MD, MD 06/26/2023 8:31:19 AM This report has been signed electronically. Number of Addenda: 0 Note Initiated On: 06/26/2023  8:07 AM Scope Withdrawal Time: 0 hours 6 minutes 29 seconds  Total Procedure Duration: 0 hours 12 minutes 58 seconds  Estimated Blood Loss:  Estimated blood loss: none.      [facility]

## 2023-06-26 NOTE — H&P (Signed)
Midge Minium, MD [facility] [address], [address] [place], [place] [address] Phone:[phone] Fax : [phone]  Primary Care Physician:  Reubin Milan, MD Primary Gastroenterologist:  Dr. Servando Snare  Pre-Procedure History & Physical: HPI:  Krista Peters is a 77 y.o. female is here for an colonoscopy.   Past Medical History:  Diagnosis Date   Depression    Grade I diastolic dysfunction    Hearing aid worn    bilateral   Hypertension    Hypothyroidism    nodules on thyroid   Thyroid disease    Weak heart (HCC)    per cardiologist/ ejection fraction <50%    Past Surgical History:  Procedure Laterality Date   APPENDECTOMY     BIOPSY THYROID  2020   BUNIONECTOMY Left 02/27/2019   Procedure: DOUBLE OSTECTOMY;  Surgeon: Gwyneth Revels, DPM;  Location: [facility];  Service: Podiatry;  Laterality: Left;  GENERAL WITH LOCAL   CATARACT EXTRACTION W/PHACO Right 01/16/2023   Procedure: CATARACT EXTRACTION PHACO AND INTRAOCULAR LENS PLACEMENT (IOC) RIGHT EYHANCE TORIC  6.10  00:35.9;  Surgeon: Nevada Crane, MD;  Location: [facility];  Service: Ophthalmology;  Laterality: Right;   CATARACT EXTRACTION W/PHACO Left 01/30/2023   Procedure: CATARACT EXTRACTION PHACO AND INTRAOCULAR LENS PLACEMENT (IOC) LEFT EYHANCE TORIC 4.81 00:28.2;  Surgeon: Nevada Crane, MD;  Location: [facility];  Service: Ophthalmology;  Laterality: Left;   COLONOSCOPY  2015   Large adenoma removed laparoscopically   COLONOSCOPY WITH PROPOFOL N/A 11/06/2017   Procedure: COLONOSCOPY WITH PROPOFOL;  Surgeon: Midge Minium, MD;  Location: [facility];  Service: Endoscopy;  Laterality: N/A;   LAPAROSCOPIC ILEOCECECTOMY  2015   Serrated adenoma   TONSILLECTOMY      Prior to Admission medications   Medication Sig Start Date End Date Taking? Authorizing Provider  buPROPion (WELLBUTRIN XL) 150 MG 24 hr tablet TAKE 1 TABLET(150 MG) BY MOUTH DAILY 12/21/22  Yes Reubin Milan, MD   carvedilol (COREG) 6.25 MG tablet Take 1 tablet (6.25 mg total) by mouth 2 (two) times daily. 06/12/23  Yes Debbe Odea, MD  levothyroxine (SYNTHROID) 25 MCG tablet Take 25 mcg by mouth daily before breakfast. 09/04/19  Yes [provider]  losartan (COZAAR) 100 MG tablet Take 100 mg by mouth daily.   Yes [provider]  OVER THE COUNTER MEDICATION Macular shield (vitamins)   Yes [provider]  oxybutynin (DITROPAN) 5 MG tablet TAKE 1 TABLET(5 MG) BY MOUTH DAILY 12/21/22  Yes Reubin Milan, MD  pravastatin (PRAVACHOL) 10 MG tablet TAKE 1 TABLET(10 MG) BY MOUTH DAILY 04/21/23  Yes Reubin Milan, MD    Allergies as of 05/03/2023 - Review Complete 04/27/2023  Allergen Reaction Noted   Lipitor [atorvastatin] Other (See Comments) 09/13/2021   Naproxen Rash 09/11/2015   Other Rash 12/21/2022   Penicillins Rash 09/11/2015   Pneumovax 23 [pneumococcal vac polyvalent] Rash 09/11/2015    Family History  Problem Relation Age of Onset   Diabetes Mother    CAD Father    Cardiomyopathy Father    Diabetes Brother    Cardiomyopathy Brother    CAD Brother     Social History   Socioeconomic History   Marital status: Married    Spouse name: Not on file   Number of children: 4   Years of education: Not on file   Highest education level: 12th grade  Occupational History   Occupation: Retired  Tobacco Use   Smoking  status: Former    Packs/day: 0.25    Years: 10.00    Additional pack years: 0.00    Total pack years: 2.50    Types: Cigarettes    Quit date: 1970    Years since quitting: 54.5   Smokeless tobacco: Never   Tobacco comments:    smoking cessation materials not required  Vaping Use   Vaping Use: Never used  Substance and Sexual Activity   Alcohol use: Yes    Alcohol/week: 10.0 standard drinks of alcohol    Types: 5 Glasses of wine, 5 Cans of beer per week    Comment: 1 beer or wine per day   Drug use: Never   Sexual activity: Not  Currently  Other Topics Concern   Not on file  Social History Narrative   Not on file   Social Determinants of Health   Financial Resource Strain: Low Risk  (04/24/2023)   Overall Financial Resource Strain (CARDIA)    Difficulty of Paying Living Expenses: Not hard at all  Food Insecurity: No Food Insecurity (04/24/2023)   Hunger Vital Sign    Worried About Running Out of Food in the Last Year: Never true    Ran Out of Food in the Last Year: Never true  Transportation Needs: No Transportation Needs (04/24/2023)   PRAPARE - Administrator, Civil Service (Medical): No    Lack of Transportation (Non-Medical): No  Physical Activity: Insufficiently Active (04/24/2023)   Exercise Vital Sign    Days of Exercise per Week: 3 days    Minutes of Exercise per Session: 20 min  Stress: Stress Concern Present (04/24/2023)   [employer] of Occupational Health - Occupational Stress Questionnaire    Feeling of Stress : To some extent  Social Connections: Moderately Integrated (04/24/2023)   Social Connection and Isolation Panel [NHANES]    Frequency of Communication with Friends and Family: More than three times a week    Frequency of Social Gatherings with Friends and Family: Three times a week    Attends Religious Services: Never    Active Member of Clubs or Organizations: No    Attends Engineer, structural: More than 4 times per year    Marital Status: Married  Catering manager Violence: Not At Risk (09/14/2022)   Humiliation, Afraid, Rape, and Kick questionnaire    Fear of Current or Ex-Partner: No    Emotionally Abused: No    Physically Abused: No    Sexually Abused: No    Review of Systems: See HPI, otherwise negative ROS  Physical Exam: BP (!) 135/58   Temp (!) 97.3 F (36.3 C) (Temporal)   Resp 13   Ht 5\' 6"  (1.676 m)   Wt 60.2 kg   SpO2 94%   BMI 21.42 kg/m  General:   Alert,  pleasant and cooperative in NAD Head:  Normocephalic and atraumatic. Neck:   Supple; no masses or thyromegaly. Lungs:  Clear throughout to auscultation.    Heart:  Regular rate and rhythm. Abdomen:  Soft, nontender and nondistended. Normal bowel sounds, without guarding, and without rebound.   Neurologic:  Alert and  oriented x4;  grossly normal neurologically.  Impression/Plan: Krista Peters is here for an colonoscopy to be performed for a history of adenomatous polyps on 2018   Risks, benefits, limitations, and alternatives regarding  colonoscopy have been reviewed with the patient.  Questions have been answered.  All parties agreeable.   Midge Minium, MD  06/26/2023, 7:37 AM

## 2023-06-26 NOTE — Transfer of Care (Signed)
Immediate Anesthesia Transfer of Care Note  Patient: Krista Peters  Procedure(s) Performed: COLONOSCOPY WITH PROPOFOL  Patient Location: PACU  Anesthesia Type: General  Level of Consciousness: awake, alert  and patient cooperative  Airway and Oxygen Therapy: Patient Spontanous Breathing and Patient connected to supplemental oxygen  Post-op Assessment: Post-op Vital signs reviewed, Patient's Cardiovascular Status Stable, Respiratory Function Stable, Patent Airway and No signs of Nausea or vomiting  Post-op Vital Signs: Reviewed and stable  Complications: No notable events documented.

## 2023-06-27 ENCOUNTER — Other Ambulatory Visit: Payer: Self-pay | Admitting: Internal Medicine

## 2023-06-27 ENCOUNTER — Encounter: Payer: Self-pay | Admitting: Gastroenterology

## 2023-06-27 ENCOUNTER — Encounter: Payer: Self-pay | Admitting: Internal Medicine

## 2023-06-27 DIAGNOSIS — I10 Essential (primary) hypertension: Secondary | ICD-10-CM

## 2023-06-27 MED ORDER — LOSARTAN POTASSIUM 100 MG PO TABS
100.0000 mg | ORAL_TABLET | Freq: Every day | ORAL | 1 refills | Status: DC
Start: 2023-06-27 — End: 2023-12-18

## 2023-07-16 IMAGING — US US THYROID
1 series · 12 of 25 positions shown · non-contrast
Comparison: None.

CLINICAL DATA: Prior ultrasound follow-up.

EXAM:
THYROID ULTRASOUND
TECHNIQUE: Ultrasound examination of the thyroid gland and adjacent soft
tissues was performed.

[Series 1: us thyroid · 0.06mm/px · 93 acquisitions, 12 frames shown]
[im 4/93]
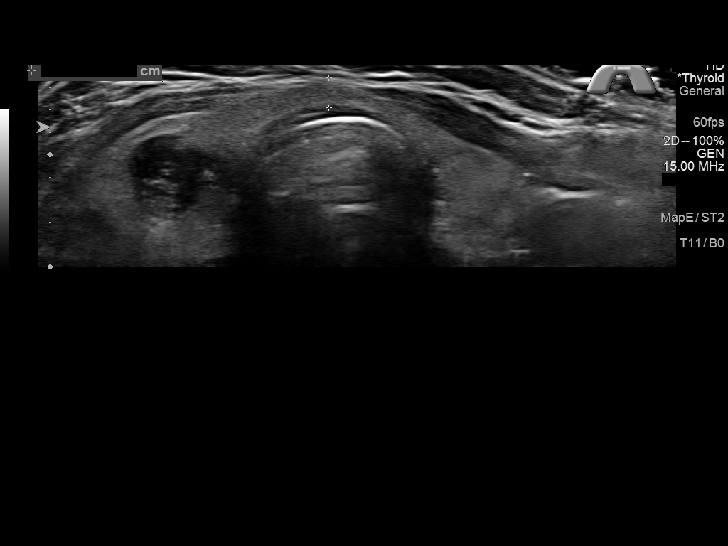
[im 12/93]
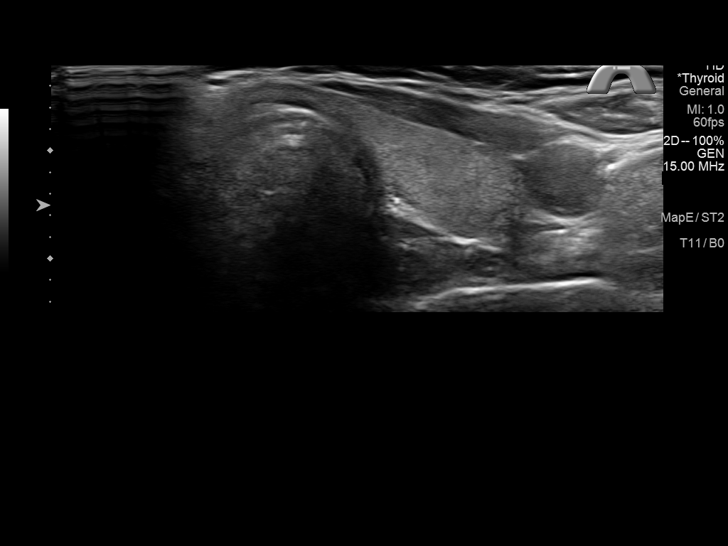
[im 20/93]
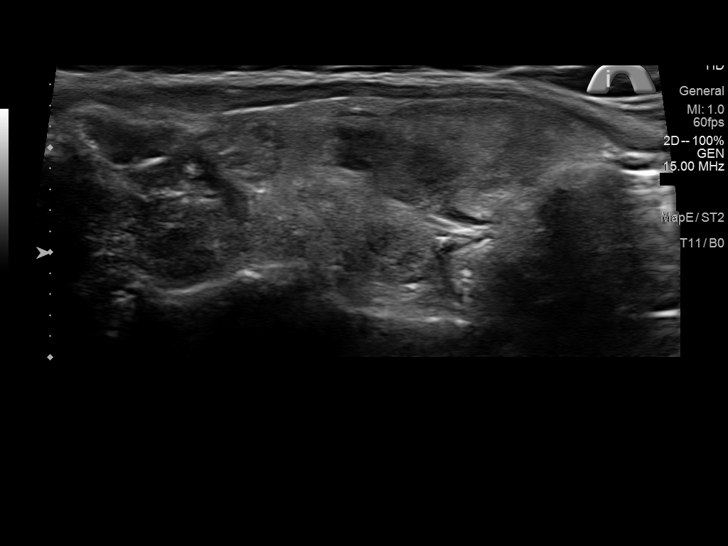
[im 27/93]
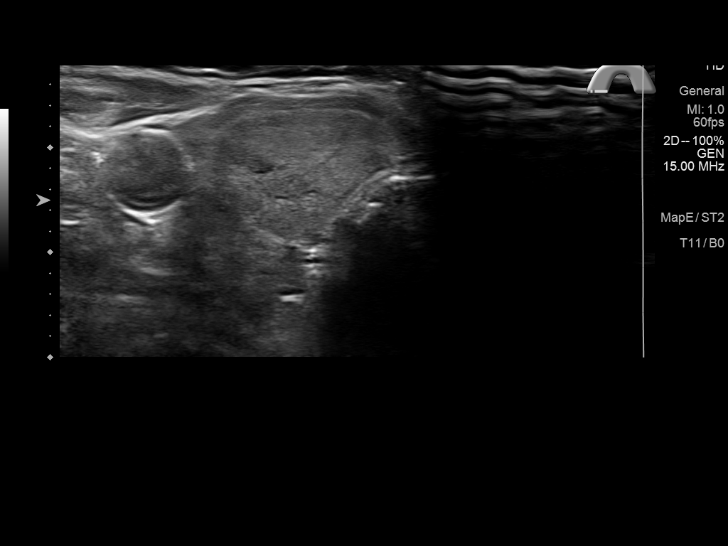
[im 35/93]
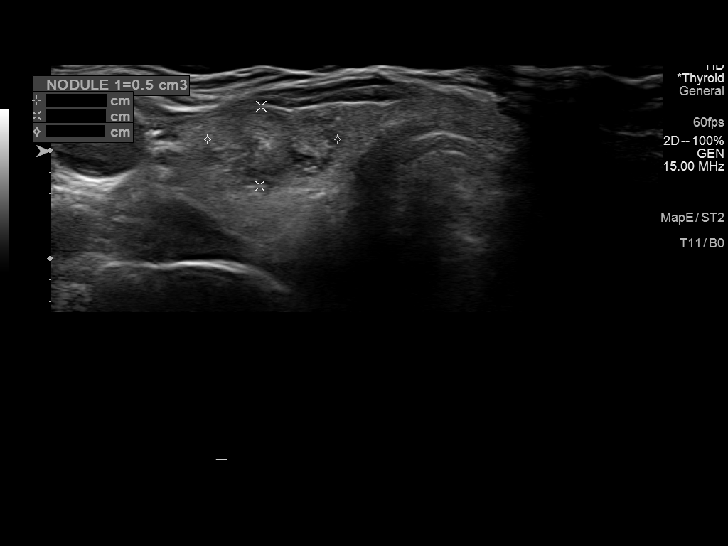
[im 43/93]
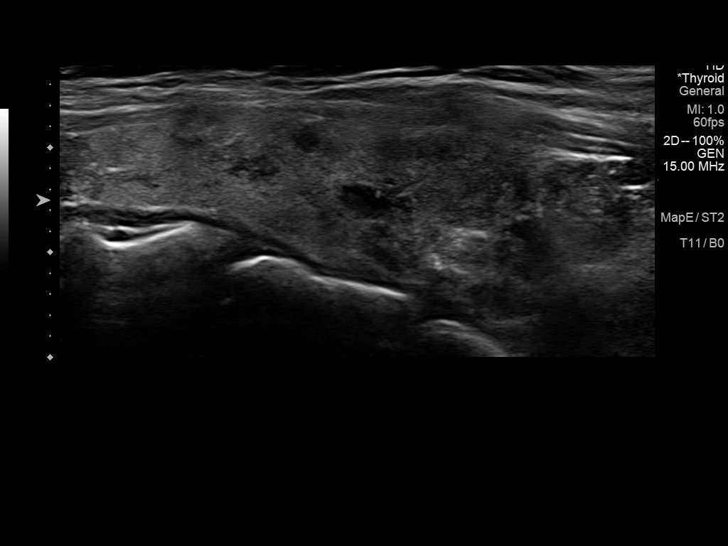
[im 50/93]
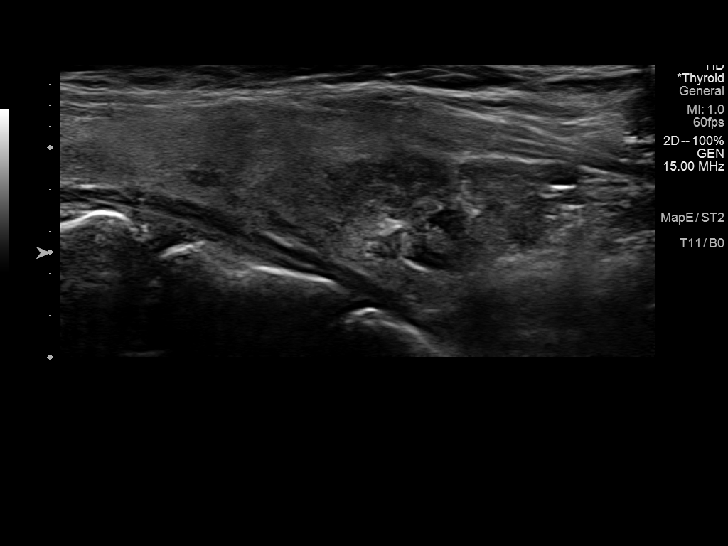
[im 58/93]
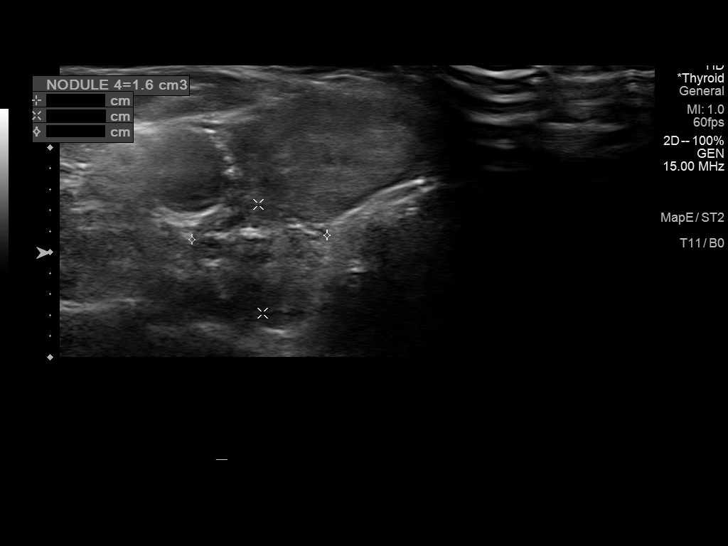
[im 66/93]
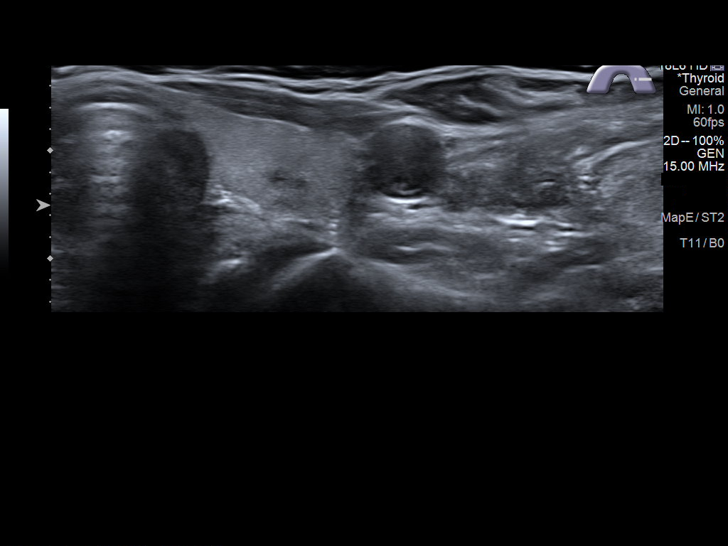
[im 73/93]
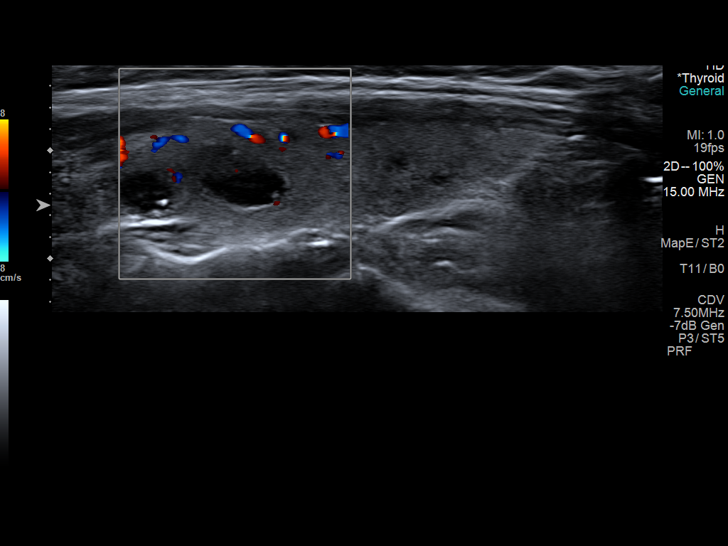
[im 81/93]
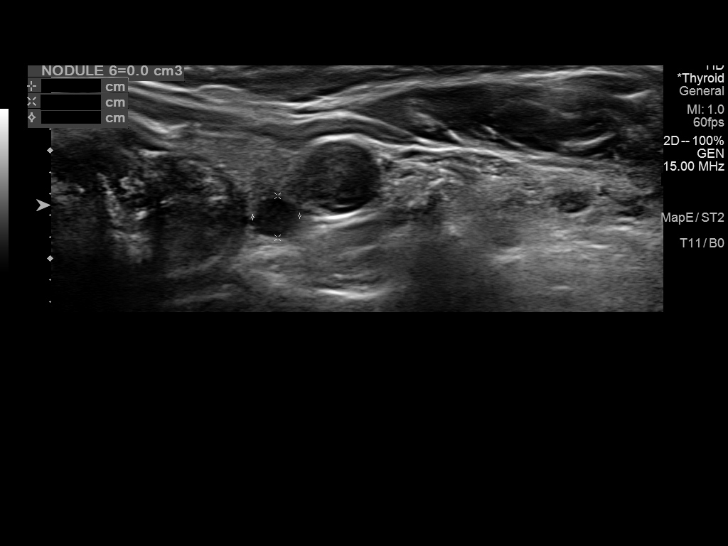
[im 89/93]
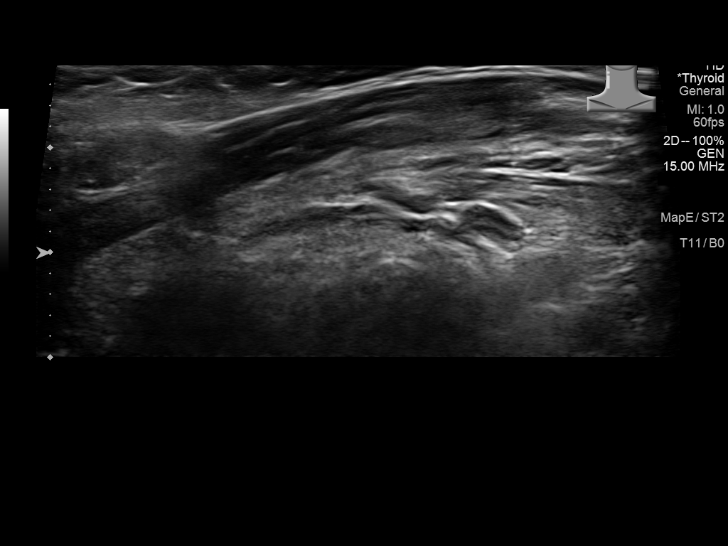

[12 of 25 positions shown; findings below may reference images not displayed]

FINDINGS: Parenchymal Echotexture: Moderately heterogenous

Isthmus: 0.3 cm

Right lobe: 5.1 x 2.1 x 1.9 cm

Left lobe: 4.9 x 1.1 x 1.3 cm

_________________________________________________________

Estimated total number of nodules >/= 1 cm: 4

Number of spongiform nodules >/=  2 cm not described below (TR1): 0

Number of mixed cystic and solid nodules >/= 1.5 cm not described
below (TR2): 0

_________________________________________________________

Nodule # 1:

Location: Right; Superior

Maximum size: 1.2 cm; Other 2 dimensions: 1.2 x 0.7 cm (previously
1.6 x 1.2 x 0.8)

Composition: solid/almost completely solid (2)

Echogenicity: isoechoic (1)

Shape: not taller-than-wide (0)

Margins: smooth (0)

Echogenic foci: none (0)

ACR TI-RADS total points: 3.

ACR TI-RADS risk category: TR3 (3 points).

ACR TI-RADS recommendations:

Given size (<1.4 cm) and appearance, this nodule does NOT meet
TI-RADS criteria for biopsy or dedicated follow-up.

_________________________________________________________

Nodule labeled 2 refers to a 2.7 cm TR 3 nodule (solid, isoechoic)
which remains similar in size. It was previously biopsied in
August 2019. Correlate with pathology results.

Nodule labeled 3 appears to correlate with a small subcentimeter
cystic lesion with comet tail artifact, which is a benign finding
and would not require further routine surveillance or biopsy.

_________________________________________________________

Nodule # 4:

Location: Right; Inferior

Maximum size: 2.3 cm; Other 2 dimensions: 1.3 x 1.0 cm (previously
up to 2.2 cm when measured similarly)

Composition: solid/almost completely solid (2)

Echogenicity: cannot determine (1)

Shape: not taller-than-wide (0)

Margins: ill-defined (0)

Echogenic foci:

ACR TI-RADS total points: 3.

ACR TI-RADS risk category: TR3 (3 points).

ACR TI-RADS recommendations:

*Given size (>/= 1.5 - 2.4 cm) and appearance, a follow-up
ultrasound in 1 year should be considered based on TI-RADS criteria.

_________________________________________________________

In the left thyroid lobe, nodules labeled 5, 6 and 7 refer to small
subcentimeter nodules which do not meet criteria for further routine
surveillance or biopsy.
IMPRESSION: 1. Multinodular thyroid gland.
2. Nodule labeled 2 refers to a 2.7 cm TR 3 nodule which was
previously biopsied. Correlate with pathology results.
3. Nodule labeled 4 has remained similar in size and appearance, and
meets criteria for follow-up ultrasound in 1 year.
4. Nodule labeled 1 now measures less than 1.5 cm on today's exam,
and does not technically meet criteria for dedicated routine
surveillance. Attention on follow-up ultrasound when performed.

The above is in keeping with the ACR TI-RADS recommendations - [HOSPITAL] 6580;[DATE].

## 2023-08-02 ENCOUNTER — Ambulatory Visit: Payer: Medicare HMO | Attending: Nurse Practitioner | Admitting: Nurse Practitioner

## 2023-08-02 ENCOUNTER — Encounter: Payer: Self-pay | Admitting: Nurse Practitioner

## 2023-08-02 VITALS — BP 148/94 | HR 67 | Ht 66.0 in | Wt 137.2 lb

## 2023-08-02 DIAGNOSIS — I10 Essential (primary) hypertension: Secondary | ICD-10-CM

## 2023-08-02 DIAGNOSIS — I340 Nonrheumatic mitral (valve) insufficiency: Secondary | ICD-10-CM

## 2023-08-02 DIAGNOSIS — E782 Mixed hyperlipidemia: Secondary | ICD-10-CM | POA: Diagnosis not present

## 2023-08-02 DIAGNOSIS — I428 Other cardiomyopathies: Secondary | ICD-10-CM

## 2023-08-02 MED ORDER — CARVEDILOL 12.5 MG PO TABS: 12.5000 mg | ORAL_TABLET | Freq: Two times a day (BID) | ORAL | 3 refills | Status: DC

## 2023-08-02 NOTE — Patient Instructions (Signed)
Medication Instructions:   INCREASE Carvedilol 12.5 mg tablet by mouth twice daily.  *If you need a refill on your cardiac medications before your next appointment, please call your pharmacy*   Lab Work:  No lab work ordered today.  If you have labs (blood work) drawn today and your tests are completely normal, you will receive your results only by: MyChart Message (if you have MyChart) OR A paper copy in the mail If you have any lab test that is abnormal or we need to change your treatment, we will call you to review the results.   Testing/Procedures:  No testing ordered today.   Follow-Up: At [facility], you and your health needs are our priority.  As part of our continuing mission to provide you with exceptional heart care, we have created designated Provider Care Teams.  These Care Teams include your primary Cardiologist (physician) and Advanced Practice Providers (APPs -  Physician Assistants and Nurse Practitioners) who all work together to provide you with the care you need, when you need it.  We recommend signing up for the patient portal called "MyChart".  Sign up information is provided on this After Visit Summary.  MyChart is used to connect with patients for Virtual Visits (Telemedicine).  Patients are able to view lab/test results, encounter notes, upcoming appointments, etc.  Non-urgent messages can be sent to your provider as well.   To learn more about what you can do with MyChart, go to [email or web address].    Your next appointment:   3 month(s)  Provider:   You may see Krista Odea, MD or one of the following Advanced Practice Providers on your designated Care Team:    Krista Ducking, NP   Other Instructions  Please check and keep a log of Blood pressures at home. You may send [place] your Blood pressure readings via mychart.

## 2023-08-02 NOTE — Progress Notes (Signed)
Office Visit    Patient Name: Krista Peters Date of Encounter: 08/02/2023  Primary Care Provider:  Reubin Milan, MD Primary Cardiologist:  Debbe Odea, MD  Chief Complaint    77 y.o. female w/ a h/o NICM, nl coronary arteries, HTN, HL, diastolic dysfxn, hypothyroidism, mild-mod MR/TR, and depression, who presents for f/u r/t NICM and HTN.  Past Medical History    Past Medical History:  Diagnosis Date   Depression    Grade I diastolic dysfunction    Hearing aid worn    bilateral   Hypertension    Hypothyroidism    nodules on thyroid   Mitral regurgitation    a. 11/2022 Echo: Mild-mod MR.   Mixed hyperlipidemia    NICM (nonischemic cardiomyopathy) (HCC)    a. 11/2022 Echo: EF 45-50%, glob HK, GrI DD, nl RV size/fxn, mild-mod MR/TR; b. 01/2023 Cor CTA: Ca2+ = 0. Nl cors.   Nodule of lower lobe of right lung    a. 01/2023 CT chest: 4mm RLL nodule->no f/u if low-risk.   Tricuspid regurgitation    a. 11/2022 Echo: Mild-mod TR.   Past Surgical History:  Procedure Laterality Date   APPENDECTOMY     BIOPSY THYROID  2020   BUNIONECTOMY Left 02/27/2019   Procedure: DOUBLE OSTECTOMY;  Surgeon: Gwyneth Revels, DPM;  Location: [facility];  Service: Podiatry;  Laterality: Left;  GENERAL WITH LOCAL   CATARACT EXTRACTION W/PHACO Right 01/16/2023   Procedure: CATARACT EXTRACTION PHACO AND INTRAOCULAR LENS PLACEMENT (IOC) RIGHT EYHANCE TORIC  6.10  00:35.9;  Surgeon: Nevada Crane, MD;  Location: [facility];  Service: Ophthalmology;  Laterality: Right;   CATARACT EXTRACTION W/PHACO Left 01/30/2023   Procedure: CATARACT EXTRACTION PHACO AND INTRAOCULAR LENS PLACEMENT (IOC) LEFT EYHANCE TORIC 4.81 00:28.2;  Surgeon: Nevada Crane, MD;  Location: [facility];  Service: Ophthalmology;  Laterality: Left;   COLONOSCOPY  2015   Large adenoma removed laparoscopically   COLONOSCOPY WITH PROPOFOL N/A 11/06/2017   Procedure: COLONOSCOPY WITH PROPOFOL;   Surgeon: Midge Minium, MD;  Location: [facility];  Service: Endoscopy;  Laterality: N/A;   COLONOSCOPY WITH PROPOFOL N/A 06/26/2023   Procedure: COLONOSCOPY WITH PROPOFOL;  Surgeon: Midge Minium, MD;  Location: [facility];  Service: Endoscopy;  Laterality: N/A;   LAPAROSCOPIC ILEOCECECTOMY  2015   Serrated adenoma   TONSILLECTOMY      Allergies  Allergies  Allergen Reactions   Lipitor [Atorvastatin] Other (See Comments)    Leg pain   Naproxen Rash   Other Rash    OTC "cold pills" cause rash.  Liquid meds are OK   Penicillins Rash   Pneumovax 23 [Pneumococcal Vac Polyvalent] Rash    History of Present Illness      77 y.o. y/o female w/ a h/o NICM, nl coronary arteries, HTN, HL, diastolic dysfxn, hypothyroidism, mild-mod MR/TR, and depression.  She previously established care in 12/2022 following echo in 11/2022, which showed mildly reduced EF @ 45-50% w/ mild-mod MR/TR.  Coronary CTA was performed and showed nl coronary arteries w/ calcium score of 0.  She was incidentally noted to have a 4mm RLL nodule.  She was subsequently placed on ? blocker and ARB.   Krista Peters was last seen in cardiology clinic in 05/2023, at which time metoprolol was transitioned to carvedilol.  She has been following her blood pressures at home and trends typically are in the 130-150 range.  Blood pressure today is 160/98 and 148/94 on  repeat.  From a heart failure standpoint, she has been doing well.  She does not experience chest pain or dyspnea denies palpitations, PND, orthopnea, dizziness, syncope, edema, or early satiety.  Home Medications    Current Outpatient Medications  Medication Sig Dispense Refill   buPROPion (WELLBUTRIN XL) 150 MG 24 hr tablet TAKE 1 TABLET(150 MG) BY MOUTH DAILY 90 tablet 3   carvedilol (COREG) 6.25 MG tablet Take 1 tablet (6.25 mg total) by mouth 2 (two) times daily. 180 tablet 3   levothyroxine (SYNTHROID) 25 MCG tablet Take 25 mcg by mouth daily before  breakfast.     losartan (COZAAR) 100 MG tablet Take 1 tablet (100 mg total) by mouth daily. 90 tablet 1   OVER THE COUNTER MEDICATION Macular shield (vitamins)     oxybutynin (DITROPAN) 5 MG tablet TAKE 1 TABLET(5 MG) BY MOUTH DAILY 90 tablet 3   pravastatin (PRAVACHOL) 10 MG tablet TAKE 1 TABLET(10 MG) BY MOUTH DAILY 90 tablet 1   No current facility-administered medications for this visit.     Review of Systems    She denies chest pain, palpitations, dyspnea, pnd, orthopnea, n, v, dizziness, syncope, edema, weight gain, or early satiety.  All other systems reviewed and are otherwise negative except as noted above.    Physical Exam    VS:  BP (!) 160/98 (BP Location: Left Arm, Patient Position: Sitting, Cuff Size: Normal)   Pulse 67   Ht 5\' 6"  (1.676 m)   Wt 137 lb 4 oz (62.3 kg)   SpO2 98%   BMI 22.15 kg/m  , BMI Body mass index is 22.15 kg/m.     Vitals:   08/02/23 0946 08/02/23 1024  BP: (!) 160/98 (!) 148/94  Pulse: 67   SpO2: 98%     GEN: Well nourished, well developed, in no acute distress. HEENT: normal. Neck: Supple, no JVD, carotid bruits, or masses. Cardiac: RRR, no murmurs, rubs, or gallops. No clubbing, cyanosis, edema.  Radials 2+/PT 2+ and equal bilaterally.  Respiratory:  Respirations regular and unlabored, clear to auscultation bilaterally. GI: Soft, nontender, nondistended, BS + x 4. MS: no deformity or atrophy. Skin: warm and dry, no rash. Neuro:  Strength and sensation are intact. Psych: Normal affect.  Accessory Clinical Findings    ECG personally reviewed by me today - EKG Interpretation Date/Time:  Wednesday August 02 2023 09:53:25 EDT Ventricular Rate:  67 PR Interval:  166 QRS Duration:  92 QT Interval:  398 QTC Calculation: 420 R Axis:   56  Text Interpretation: Normal sinus rhythm Normal ECG When compared with ECG of 17-Feb-2016 10:12, No significant change was found Confirmed by Nicolasa Ducking [phone]) on 08/02/2023 9:58:02 AM  - no  acute changes.  Lab Results  Component Value Date   WBC 5.3 10/26/2022   HGB 14.2 10/26/2022   HCT 41.9 10/26/2022   MCV 93 10/26/2022   PLT 315 10/26/2022   Lab Results  Component Value Date   CREATININE 0.77 04/27/2023   BUN 10 04/27/2023   NA 141 04/27/2023   K 4.9 04/27/2023   CL 103 04/27/2023   CO2 25 04/27/2023   Lab Results  Component Value Date   ALT 14 04/27/2023   AST 15 04/27/2023   ALKPHOS 83 04/27/2023   BILITOT 0.4 04/27/2023   Lab Results  Component Value Date   CHOL 174 04/27/2023   HDL 69 04/27/2023   LDLCALC 87 04/27/2023   TRIG 98 04/27/2023   CHOLHDL 2.5 04/27/2023  Assessment & Plan    1.  Nonischemic cardiomyopathy: Echocardiogram in December 2023 notable for an EF of 45 to 50% with mild to moderate MR and TR.  Coronary calcium score of 0 with normal coronary arteries by coronary CTA in January 2024.  She has been maintained on carvedilol and losartan.  Blood pressure elevated today and titrating carvedilol.  She is euvolemic on examination and otherwise doing well at home without symptoms or limitations.  2.  Primary hypertension: Blood pressure elevated today on 2 consecutive readings.  Increasing carvedilol to 12.5 mg twice daily.  Continue losartan 100 mg daily.  Patient will send [place] a MyChart message in 2 weeks with blood pressure trends, at which time we may need to consider additional titration of beta-blocker therapy versus addition of MRA.  3.  Hyperlipidemia: LDL of 87 in May 2024.  Normal coronary arteries.  Continue statin therapy.  4.  Mild to moderate MR/TR: Plan to follow-up echo in the next few years.  5.  Disposition:  F/u in 3 mos or sooner if necessary.  Informed Consent    Nicolasa Ducking, NP 08/02/2023, 10:00 AM

## 2023-08-15 ENCOUNTER — Ambulatory Visit (INDEPENDENT_AMBULATORY_CARE_PROVIDER_SITE_OTHER): Payer: Medicare HMO | Admitting: Family Medicine

## 2023-08-15 ENCOUNTER — Ambulatory Visit: Payer: Self-pay | Admitting: *Deleted

## 2023-08-15 ENCOUNTER — Encounter: Payer: Self-pay | Admitting: Family Medicine

## 2023-08-15 VITALS — BP 122/78 | HR 80 | Ht 66.0 in | Wt 135.0 lb

## 2023-08-15 DIAGNOSIS — H66012 Acute suppurative otitis media with spontaneous rupture of ear drum, left ear: Secondary | ICD-10-CM

## 2023-08-15 MED ORDER — AZITHROMYCIN 250 MG PO TABS
ORAL_TABLET | ORAL | 0 refills | Status: AC
Start: 2023-08-15 — End: 2023-08-20

## 2023-08-15 NOTE — Telephone Encounter (Signed)
Summary: left ear discomfort   Per agent: "The patient has noticed that there has been discomfort and fluid coming from their left ear since 08/11/23  The patient shares that they are still able to hear out of their ear but it is noticeably different    The patient would like to speak with a member of clinical staff further when possible."           Chief Complaint: Earache Symptoms: Left ear with clear drainage, 2/10 pain, "Clogged." Frequency: 4 days Pertinent Negatives: Patient denies fever, cold symptoms, dizziness. Disposition: [] ED /[] Urgent Care (no appt availability in office) / [x] Appointment(In office/virtual)/ []  [place] [facility]/ [] Home Care/ [] Refused Recommended Disposition /[] [facility]/ []  Follow-up with PCP Additional Notes: Appt secured for today. Care advise provided, pt verbalizes understanding.  Reason for Disposition  Ear pain  Answer Assessment - Initial Assessment Questions 1. LOCATION: "Which ear is involved?"      Left 2. COLOR: "What is the color of the discharge?"        Clear   3. CONSISTENCY: "How runny is the discharge? Could it be water?"      No 4. ONSET: "When did you first notice the discharge?"     4 days 5. PAIN: "Is there any earache?" "How bad is it?"  (Scale 1-10; or mild, moderate, severe)     2/10 6. OBJECTS: "Have you put anything in your ear?" (e.g., Q-tip, other object)      flushing 7. OTHER SYMPTOMS: "Do you have any other symptoms?" (e.g., headache, fever, dizziness, vomiting, runny nose)     Headache Sat, not presently.  Protocols used: Ear - Discharge-A-AH

## 2023-08-15 NOTE — Progress Notes (Signed)
Date:  08/15/2023   Name:  Krista Peters   DOB:  March 11, 1946   MRN:  [id number]   Chief Complaint: Ear Fullness (Started Friday- use peroxide and water and started draining clear liquid)  Ear Fullness  There is pain in the left ear. This is a new problem. The current episode started in the past 7 days. The problem has been unchanged. There has been no fever. The pain is mild. Associated symptoms include ear discharge. Pertinent negatives include no hearing loss, neck pain, rhinorrhea or sore throat. She has tried ear drops for the symptoms. The treatment provided mild relief. There is no history of hearing loss.    Lab Results  Component Value Date   NA 141 04/27/2023   K 4.9 04/27/2023   CO2 25 04/27/2023   GLUCOSE 92 04/27/2023   BUN 10 04/27/2023   CREATININE 0.77 04/27/2023   CALCIUM 9.3 04/27/2023   EGFR 80 04/27/2023   GFRNONAA >60 12/30/2022   Lab Results  Component Value Date   CHOL 174 04/27/2023   HDL 69 04/27/2023   LDLCALC 87 04/27/2023   TRIG 98 04/27/2023   CHOLHDL 2.5 04/27/2023   Lab Results  Component Value Date   TSH 1.870 10/21/2020   No results found for: "HGBA1C" Lab Results  Component Value Date   WBC 5.3 10/26/2022   HGB 14.2 10/26/2022   HCT 41.9 10/26/2022   MCV 93 10/26/2022   PLT 315 10/26/2022   Lab Results  Component Value Date   ALT 14 04/27/2023   AST 15 04/27/2023   ALKPHOS 83 04/27/2023   BILITOT 0.4 04/27/2023   Lab Results  Component Value Date   VD25OH 62.0 10/25/2021     Review of Systems  HENT:  Positive for ear discharge. Negative for hearing loss, nosebleeds, rhinorrhea, sinus pressure, sore throat and tinnitus.   Respiratory:  Negative for choking and chest tightness.   Cardiovascular:  Negative for chest pain, palpitations and leg swelling.  Gastrointestinal:  Negative for abdominal distention.  Endocrine: Negative for polydipsia, polyphagia and polyuria.  Genitourinary:  Negative for urgency and vaginal  bleeding.  Musculoskeletal:  Negative for neck pain.    Patient Active Problem List   Diagnosis Date Noted   History of colonic polyps 06/26/2023   Mitral regurgitation 04/27/2023   Essential hypertension 04/27/2023   Nontoxic single thyroid nodule 09/16/2019   Mass of skin of left hand 04/28/2018   Osteoporosis of multiple sites without pathological fracture 10/21/2016   Urge incontinence of urine 09/11/2015   Major depression, single episode, in complete remission (HCC) 09/11/2015   Mixed hyperlipidemia 09/11/2015   Irritable bowel syndrome without diarrhea 09/11/2015   Hx of adenomatous colonic polyps 08/27/2014   Adhesive capsulitis 03/26/2014   Disequilibrium 06/13/2008    Allergies  Allergen Reactions   Lipitor [Atorvastatin] Other (See Comments)    Leg pain   Naproxen Rash   Other Rash    OTC "cold pills" cause rash.  Liquid meds are OK   Penicillins Rash   Pneumovax 23 [Pneumococcal Vac Polyvalent] Rash    Past Surgical History:  Procedure Laterality Date   APPENDECTOMY     BIOPSY THYROID  2020   BUNIONECTOMY Left 02/27/2019   Procedure: DOUBLE OSTECTOMY;  Surgeon: Gwyneth Revels, DPM;  Location: [facility];  Service: Podiatry;  Laterality: Left;  GENERAL WITH LOCAL   CATARACT EXTRACTION W/PHACO Right 01/16/2023   Procedure: CATARACT EXTRACTION PHACO AND INTRAOCULAR LENS PLACEMENT (IOC) RIGHT [place] [facility]  TORIC  6.10  00:35.9;  Surgeon: Nevada Crane, MD;  Location: [facility];  Service: Ophthalmology;  Laterality: Right;   CATARACT EXTRACTION W/PHACO Left 01/30/2023   Procedure: CATARACT EXTRACTION PHACO AND INTRAOCULAR LENS PLACEMENT (IOC) LEFT EYHANCE TORIC 4.81 00:28.2;  Surgeon: Nevada Crane, MD;  Location: [facility];  Service: Ophthalmology;  Laterality: Left;   COLONOSCOPY  2015   Large adenoma removed laparoscopically   COLONOSCOPY WITH PROPOFOL N/A 11/06/2017   Procedure: COLONOSCOPY WITH PROPOFOL;  Surgeon: Midge Minium, MD;   Location: [facility];  Service: Endoscopy;  Laterality: N/A;   COLONOSCOPY WITH PROPOFOL N/A 06/26/2023   Procedure: COLONOSCOPY WITH PROPOFOL;  Surgeon: Midge Minium, MD;  Location: [facility];  Service: Endoscopy;  Laterality: N/A;   LAPAROSCOPIC ILEOCECECTOMY  2015   Serrated adenoma   TONSILLECTOMY      Social History   Tobacco Use   Smoking status: Former    Current packs/day: 0.00    Average packs/day: 0.3 packs/day for 10.0 years (2.5 ttl pk-yrs)    Types: Cigarettes    Start date: 48    Quit date: 1970    Years since quitting: 54.6   Smokeless tobacco: Never   Tobacco comments:    smoking cessation materials not required  Vaping Use   Vaping status: Never Used  Substance Use Topics   Alcohol use: Yes    Alcohol/week: 10.0 standard drinks of alcohol    Types: 5 Glasses of wine, 5 Cans of beer per week    Comment: 1 beer or wine per day   Drug use: Never     Medication list has been reviewed and updated.  Current Meds  Medication Sig   buPROPion (WELLBUTRIN XL) 150 MG 24 hr tablet TAKE 1 TABLET(150 MG) BY MOUTH DAILY   carvedilol (COREG) 12.5 MG tablet Take 1 tablet (12.5 mg total) by mouth 2 (two) times daily.   levothyroxine (SYNTHROID) 25 MCG tablet Take 25 mcg by mouth daily before breakfast.   losartan (COZAAR) 100 MG tablet Take 1 tablet (100 mg total) by mouth daily.   OVER THE COUNTER MEDICATION Macular shield (vitamins)   oxybutynin (DITROPAN) 5 MG tablet TAKE 1 TABLET(5 MG) BY MOUTH DAILY   pravastatin (PRAVACHOL) 10 MG tablet TAKE 1 TABLET(10 MG) BY MOUTH DAILY       08/15/2023    1:43 PM 04/27/2023    8:06 AM 10/26/2022    8:05 AM 09/14/2022    8:09 AM  GAD 7 : Generalized Anxiety Score  Nervous, Anxious, on Edge 0 0 0 1  Control/stop worrying 0 0 0 0  Worry too much - different things 0 1 0 1  Trouble relaxing 0 1 0 0  Restless 0 0 0 0  Easily annoyed or irritable 0 0 0 0  Afraid - awful might happen 0 0 0 0  Total GAD 7 Score  0 2 0 2  Anxiety Difficulty Not difficult at all Not difficult at all Not difficult at all Not difficult at all       08/15/2023    1:43 PM 04/27/2023    8:06 AM 10/26/2022    8:05 AM  Depression screen PHQ 2/9  Decreased Interest 0 0 0  Down, Depressed, Hopeless 0 0 0  PHQ - 2 Score 0 0 0  Altered sleeping 0 1 0  Tired, decreased energy 0 1 0  Change in appetite 0 0 0  Feeling bad or failure about yourself  0 0 0  Trouble concentrating 0 0 0  Moving slowly or fidgety/restless 0 0 0  Suicidal thoughts 0 0 0  PHQ-9 Score 0 2 0  Difficult doing work/chores Not difficult at all Not difficult at all Not difficult at all    BP Readings from Last 3 Encounters:  08/15/23 122/78  08/02/23 (!) 148/94  06/26/23 112/68    Physical Exam Vitals and nursing note reviewed.  HENT:     Head: Normocephalic.     Right Ear: Tympanic membrane and ear canal normal. There is no impacted cerumen.     Left Ear: Ear canal normal. No drainage. There is no impacted cerumen. Tympanic membrane is perforated and erythematous.     Nose: Nose normal.  Cardiovascular:     Heart sounds: No murmur heard.    No friction rub. No gallop.  Pulmonary:     Breath sounds: No wheezing, rhonchi or rales.  Neurological:     Mental Status: She is alert.     Wt Readings from Last 3 Encounters:  08/15/23 135 lb (61.2 kg)  08/02/23 137 lb 4 oz (62.3 kg)  06/26/23 132 lb 11.2 oz (60.2 kg)    BP 122/78   Pulse 80   Ht 5\' 6"  (1.676 m)   Wt 135 lb (61.2 kg)   SpO2 98%   BMI 21.79 kg/m   Assessment and Plan: 1. Non-recurrent acute suppurative otitis media of left ear with spontaneous rupture of tympanic membrane New onset.  Persistent.  Stable.  Tympanic membrane is dull erythematous with some purulence noted at 4 or 5:00 presumably in the area of perforation.  This looks like a spontaneous rupture and otitis media and we will treat with azithromycin given the fact that patient is allergic to penicillin.   Patient is to return for reevaluation Dr. Judithann Graves.  I have elected not to irrigate and that I do not see any cerumen to be removed in do not wish to contaminate with water under pressure. - azithromycin (ZITHROMAX) 250 MG tablet; Take 2 tablets on day 1, then 1 tablet daily on days 2 through 5  Dispense: 6 tablet; Refill: 0     Elizabeth Sauer, MD

## 2023-08-17 ENCOUNTER — Encounter: Payer: Self-pay | Admitting: Family Medicine

## 2023-08-18 ENCOUNTER — Ambulatory Visit: Payer: Medicare HMO | Admitting: Family Medicine

## 2023-08-18 ENCOUNTER — Other Ambulatory Visit: Payer: Self-pay

## 2023-08-18 ENCOUNTER — Telehealth: Payer: Self-pay

## 2023-08-18 DIAGNOSIS — H66012 Acute suppurative otitis media with spontaneous rupture of ear drum, left ear: Secondary | ICD-10-CM

## 2023-08-18 DIAGNOSIS — H60332 Swimmer's ear, left ear: Secondary | ICD-10-CM | POA: Diagnosis not present

## 2023-08-18 NOTE — Progress Notes (Signed)
Placed ref to ENT 

## 2023-08-18 NOTE — Telephone Encounter (Signed)
Called pt with appt [facility] today at 1:00 for a 1:30 appt in [place]- pt aware

## 2023-08-21 MED ORDER — CARVEDILOL 12.5 MG PO TABS: 18.7500 mg | ORAL_TABLET | Freq: Two times a day (BID) | ORAL | Status: DC

## 2023-08-24 ENCOUNTER — Ambulatory Visit: Payer: Medicare HMO | Admitting: Internal Medicine

## 2023-08-24 DIAGNOSIS — H60332 Swimmer's ear, left ear: Secondary | ICD-10-CM | POA: Diagnosis not present

## 2023-08-24 DIAGNOSIS — H90A32 Mixed conductive and sensorineural hearing loss, unilateral, left ear with restricted hearing on the contralateral side: Secondary | ICD-10-CM | POA: Diagnosis not present

## 2023-08-29 DIAGNOSIS — H353132 Nonexudative age-related macular degeneration, bilateral, intermediate dry stage: Secondary | ICD-10-CM | POA: Diagnosis not present

## 2023-08-29 DIAGNOSIS — Z961 Presence of intraocular lens: Secondary | ICD-10-CM | POA: Diagnosis not present

## 2023-08-29 DIAGNOSIS — D3131 Benign neoplasm of right choroid: Secondary | ICD-10-CM | POA: Diagnosis not present

## 2023-08-29 DIAGNOSIS — H53001 Unspecified amblyopia, right eye: Secondary | ICD-10-CM | POA: Diagnosis not present

## 2023-09-06 ENCOUNTER — Other Ambulatory Visit: Payer: Self-pay

## 2023-09-06 MED ORDER — CARVEDILOL 12.5 MG PO TABS: 18.7500 mg | ORAL_TABLET | Freq: Two times a day (BID) | ORAL | 3 refills | Status: DC

## 2023-09-07 DIAGNOSIS — H65112 Acute and subacute allergic otitis media (mucoid) (sanguinous) (serous), left ear: Secondary | ICD-10-CM | POA: Diagnosis not present

## 2023-09-07 DIAGNOSIS — H60332 Swimmer's ear, left ear: Secondary | ICD-10-CM | POA: Diagnosis not present

## 2023-09-13 DIAGNOSIS — E041 Nontoxic single thyroid nodule: Secondary | ICD-10-CM | POA: Diagnosis not present

## 2023-09-18 DIAGNOSIS — H903 Sensorineural hearing loss, bilateral: Secondary | ICD-10-CM | POA: Diagnosis not present

## 2023-09-18 DIAGNOSIS — H6982 Other specified disorders of Eustachian tube, left ear: Secondary | ICD-10-CM | POA: Diagnosis not present

## 2023-09-18 DIAGNOSIS — E041 Nontoxic single thyroid nodule: Secondary | ICD-10-CM | POA: Diagnosis not present

## 2023-09-20 ENCOUNTER — Ambulatory Visit: Payer: Medicare HMO

## 2023-09-20 VITALS — BP 132/70 | Ht 66.0 in | Wt 134.0 lb

## 2023-09-20 DIAGNOSIS — Z0000 Encounter for general adult medical examination without abnormal findings: Secondary | ICD-10-CM

## 2023-09-20 DIAGNOSIS — Z780 Asymptomatic menopausal state: Secondary | ICD-10-CM

## 2023-09-20 NOTE — Patient Instructions (Addendum)
Ms. Riopel , Thank you for taking time to come for your Medicare Wellness Visit. I appreciate your ongoing commitment to your health goals. Please review the following plan we discussed and let me know if I can assist you in the future.   Referrals/Orders/Follow-Ups/Clinician Recommendations: none- placed referral for bone density scan  This is a list of the screening recommended for you and due dates:  Health Maintenance  Topic Date Due   DTaP/Tdap/Td vaccine (2 - Td or Tdap) 12/27/2020   Flu Shot  07/27/2023   COVID-19 Vaccine (7 - 2023-24 season) 08/27/2023   Pneumonia Vaccine (2 of 2 - PCV) 10/27/2023*   Mammogram  11/05/2023   Medicare Annual Wellness Visit  09/19/2024   Colon Cancer Screening  06/25/2028   DEXA scan (bone density measurement)  Completed   Zoster (Shingles) Vaccine  Completed   Hepatitis C Screening  Addressed   HPV Vaccine  Aged Out  *Topic was postponed. The date shown is not the original due date.    Advanced directives: (In Chart) A copy of your advanced directives are scanned into your chart should your provider ever need it.  Next Medicare Annual Wellness Visit scheduled for next year: Yes   09/25/24 @ 9:45 am in person

## 2023-09-20 NOTE — Progress Notes (Signed)
Subjective:   Krista Peters is a 77 y.o. female who presents for Medicare Annual (Subsequent) preventive examination.  Visit Complete: In person   Cardiac Risk Factors include: advanced age (>58men, >7 women);dyslipidemia;hypertension     Objective:    Today's Vitals   09/20/23 0934  BP: 132/70  Weight: 134 lb (60.8 kg)  Height: 5\' 6"  (1.676 m)   Body mass index is 21.63 kg/m.     09/20/2023    9:40 AM 06/26/2023    7:21 AM 01/30/2023    9:41 AM 01/16/2023   11:41 AM 09/14/2022    8:41 AM 09/13/2021   10:23 AM 09/09/2020   10:09 AM  Advanced Directives  Does Patient Have a Medical Advance Directive? Yes Yes Yes Yes Yes Yes Yes  Type of Estate agent of [place];Living will Healthcare Power of [place];Living will  Living will;Healthcare Power of [employer] Power of [place];Living will Healthcare Power of [place];Living will  Does patient want to make changes to medical advance directive? No - Patient declined    No - Patient declined    Copy of Healthcare Power of Attorney in Chart? Yes - validated most recent copy scanned in chart (See row information) Yes - validated most recent copy scanned in chart (See row information) No - copy requested No - copy requested  Yes - validated most recent copy scanned in chart (See row information) Yes - validated most recent copy scanned in chart (See row information)    Current Medications (verified) Outpatient Encounter Medications as of 09/20/2023  Medication Sig   buPROPion (WELLBUTRIN XL) 150 MG 24 hr tablet TAKE 1 TABLET(150 MG) BY MOUTH DAILY   carvedilol (COREG) 12.5 MG tablet Take 1.5 tablets (18.75 mg total) by mouth 2 (two) times daily.   levothyroxine (SYNTHROID) 25 MCG tablet Take 25 mcg by mouth daily before breakfast.   losartan (COZAAR) 100 MG tablet Take 1 tablet (100 mg total) by mouth daily.   OVER THE COUNTER MEDICATION Macular shield (vitamins)   oxybutynin (DITROPAN) 5 MG tablet TAKE  1 TABLET(5 MG) BY MOUTH DAILY   pravastatin (PRAVACHOL) 10 MG tablet TAKE 1 TABLET(10 MG) BY MOUTH DAILY   No facility-administered encounter medications on file as of 09/20/2023.    Allergies (verified) Lipitor [atorvastatin], Naproxen, Other, Penicillins, and Pneumovax 23 [pneumococcal vac polyvalent]   History: Past Medical History:  Diagnosis Date   Depression    Grade I diastolic dysfunction    Hearing aid worn    bilateral   Hypertension    Hypothyroidism    nodules on thyroid   Mitral regurgitation    a. 11/2022 Echo: Mild-mod MR.   Mixed hyperlipidemia    NICM (nonischemic cardiomyopathy) (HCC)    a. 11/2022 Echo: EF 45-50%, glob HK, GrI DD, nl RV size/fxn, mild-mod MR/TR; b. 01/2023 Cor CTA: Ca2+ = 0. Nl cors.   Nodule of lower lobe of right lung    a. 01/2023 CT chest: 4mm RLL nodule->no f/u if low-risk.   Tricuspid regurgitation    a. 11/2022 Echo: Mild-mod TR.   Past Surgical History:  Procedure Laterality Date   APPENDECTOMY     BIOPSY THYROID  2020   BUNIONECTOMY Left 02/27/2019   Procedure: DOUBLE OSTECTOMY;  Surgeon: Gwyneth Revels, DPM;  Location: [facility];  Service: Podiatry;  Laterality: Left;  GENERAL WITH LOCAL   CATARACT EXTRACTION W/PHACO Right 01/16/2023   Procedure: CATARACT EXTRACTION PHACO AND INTRAOCULAR LENS PLACEMENT (IOC) RIGHT EYHANCE TORIC  6.10  00:35.9;  Surgeon: Nevada Crane, MD;  Location: [facility];  Service: Ophthalmology;  Laterality: Right;   CATARACT EXTRACTION W/PHACO Left 01/30/2023   Procedure: CATARACT EXTRACTION PHACO AND INTRAOCULAR LENS PLACEMENT (IOC) LEFT EYHANCE TORIC 4.81 00:28.2;  Surgeon: Nevada Crane, MD;  Location: [facility];  Service: Ophthalmology;  Laterality: Left;   COLONOSCOPY  2015   Large adenoma removed laparoscopically   COLONOSCOPY WITH PROPOFOL N/A 11/06/2017   Procedure: COLONOSCOPY WITH PROPOFOL;  Surgeon: Midge Minium, MD;  Location: [facility];  Service:  Endoscopy;  Laterality: N/A;   COLONOSCOPY WITH PROPOFOL N/A 06/26/2023   Procedure: COLONOSCOPY WITH PROPOFOL;  Surgeon: Midge Minium, MD;  Location: [facility];  Service: Endoscopy;  Laterality: N/A;   LAPAROSCOPIC ILEOCECECTOMY  2015   Serrated adenoma   TONSILLECTOMY     Family History  Problem Relation Age of Onset   Diabetes Mother    CAD Father    Cardiomyopathy Father    Diabetes Brother    Cardiomyopathy Brother    CAD Brother    Social History   Socioeconomic History   Marital status: Married    Spouse name: Not on file   Number of children: 4   Years of education: Not on file   Highest education level: 12th grade  Occupational History   Occupation: Retired  Tobacco Use   Smoking status: Former    Current packs/day: 0.00    Average packs/day: 0.3 packs/day for 10.0 years (2.5 ttl pk-yrs)    Types: Cigarettes    Start date: 73    Quit date: 1970    Years since quitting: 54.7   Smokeless tobacco: Never   Tobacco comments:    smoking cessation materials not required  Vaping Use   Vaping status: Never Used  Substance and Sexual Activity   Alcohol use: Yes    Alcohol/week: 10.0 standard drinks of alcohol    Types: 5 Glasses of wine, 5 Cans of beer per week    Comment: 1 beer or wine per day   Drug use: Never   Sexual activity: Not Currently  Other Topics Concern   Not on file  Social History Narrative   Not on file   Social Determinants of Health   Financial Resource Strain: Low Risk  (09/20/2023)   Overall Financial Resource Strain (CARDIA)    Difficulty of Paying Living Expenses: Not hard at all  Food Insecurity: No Food Insecurity (09/20/2023)   Hunger Vital Sign    Worried About Running Out of Food in the Last Year: Never true    Ran Out of Food in the Last Year: Never true  Transportation Needs: No Transportation Needs (09/20/2023)   PRAPARE - Administrator, Civil Service (Medical): No    Lack of Transportation (Non-Medical):  No  Physical Activity: Insufficiently Active (09/20/2023)   Exercise Vital Sign    Days of Exercise per Week: 3 days    Minutes of Exercise per Session: 30 min  Stress: Stress Concern Present (09/20/2023)   [employer] of Occupational Health - Occupational Stress Questionnaire    Feeling of Stress : To some extent  Social Connections: Moderately Integrated (09/20/2023)   Social Connection and Isolation Panel [NHANES]    Frequency of Communication with Friends and Family: More than three times a week    Frequency of Social Gatherings with Friends and Family: Once a week    Attends Religious Services: Never    Active Member  of Clubs or Organizations: Yes    Attends Engineer, structural: More than 4 times per year    Marital Status: Married    Tobacco Counseling Counseling given: Not Answered Tobacco comments: smoking cessation materials not required   Clinical Intake:  Pre-visit preparation completed: Yes  Pain : No/denies pain     Nutritional Status: BMI of 19-24  Normal Nutritional Risks: None Diabetes: No  How often do you need to have someone help you when you read instructions, pamphlets, or other written materials from your doctor or pharmacy?: 1 - Never  Interpreter Needed?: No  Information entered by :: Kennedy Bucker, LPN   Activities of Daily Living    09/20/2023    9:41 AM 09/17/2023    8:38 AM  In your present state of health, do you have any difficulty performing the following activities:  Hearing? 1   Vision? 0 0  Difficulty concentrating or making decisions? 0 0  Walking or climbing stairs? 0 0  Dressing or bathing? 0 0  Doing errands, shopping? 0 0  Preparing Food and eating ? N N  Using the Toilet? N N  In the past six months, have you accidently leaked urine? N N  Do you have problems with loss of bowel control? N N  Managing your Medications? N N  Managing your Finances? N N  Housekeeping or managing your Housekeeping? N N     Patient Care Team: Reubin Milan, MD as PCP - General (Internal Medicine) Debbe Odea, MD as PCP - Cardiology (Cardiology) [facility] as Consulting Physician (Dermatology)  Indicate any recent Medical Services you may have received from other than [facility] providers in the past year (date may be approximate).     Assessment:   This is a routine wellness examination for Krista Peters.  Hearing/Vision screen Hearing Screening - Comments:: Wears aids Vision Screening - Comments:: Wears glasses for reading- [facility]. Brooke Dare   Goals Addressed             This Visit's Progress    DIET - EAT MORE FRUITS AND VEGETABLES         Depression Screen    09/20/2023    9:38 AM 08/15/2023    1:43 PM 04/27/2023    8:06 AM 10/26/2022    8:05 AM 09/14/2022    8:09 AM 10/25/2021    8:05 AM 09/13/2021   10:21 AM  PHQ 2/9 Scores  PHQ - 2 Score 0 0 0 0 0 0 1  PHQ- 9 Score 0 0 2 0 1 0 3    Fall Risk    09/20/2023    9:41 AM 09/17/2023    8:38 AM 08/15/2023    1:43 PM 04/27/2023    8:06 AM 10/26/2022    8:05 AM  Fall Risk   Falls in the past year? 0 0 0 0 0  Number falls in past yr: 0  0 0 0  Injury with Fall? 0  0 0 0  Risk for fall due to : No Fall Risks  No Fall Risks No Fall Risks History of fall(s)  Follow up Falls prevention discussed;Falls evaluation completed  Falls evaluation completed Falls evaluation completed Falls evaluation completed    MEDICARE RISK AT HOME: Medicare Risk at Home Any stairs in or around the home?: No If so, are there any without handrails?: No Home free of loose throw rugs in walkways, pet beds, electrical cords, etc?: Yes Adequate lighting in your home  to reduce risk of falls?: Yes Life alert?: No Use of a cane, walker or w/c?: No Grab bars in the bathroom?: Yes Shower chair or bench in shower?: Yes Elevated toilet seat or a handicapped toilet?: Yes  TIMED UP AND GO:  Was the test performed?  Yes  Length of time to ambulate  10 feet: 4 sec Gait steady and fast without use of assistive device    Cognitive Function:        09/20/2023    9:42 AM 09/14/2022    8:42 AM 09/09/2019   10:21 AM 09/03/2018   10:30 AM 08/31/2017    9:52 AM  6CIT Screen  What Year? 0 points 0 points 0 points 0 points 0 points  What month? 0 points 0 points 0 points 0 points 0 points  What time? 0 points 0 points 0 points 0 points 0 points  Count back from 20 0 points 0 points 0 points 0 points 0 points  Months in reverse 0 points 0 points 0 points 0 points 0 points  Repeat phrase 0 points 0 points 0 points 0 points 0 points  Total Score 0 points 0 points 0 points 0 points 0 points    Immunizations Immunization History  Administered Date(s) Administered   Fluad Quad(high Dose 65+) 09/10/2019, 09/14/2022   Influenza, High Dose Seasonal PF 09/25/2013, 10/30/2018   Influenza,inj,Quad PF,6+ Mos 09/11/2015, 09/20/2016, 08/31/2017   Influenza-Unspecified 10/30/2018, 10/15/2020, 10/01/2021   Moderna Covid-19 Vaccine Bivalent Booster 24yrs & up 10/01/2021   Moderna SARS-COV2 Booster Vaccination 11/03/2020, 04/12/2021   Moderna Sars-Covid-2 Vaccination 01/30/2020, 02/27/2020   Pfizer(Comirnaty)Fall Seasonal Vaccine 12 years and older 09/16/2022   Pneumococcal Polysaccharide-23 12/28/2011, 11/18/2013   Tdap 12/27/2010   Zoster Recombinant(Shingrix) 06/09/2018, 08/28/2018   Zoster, Live 08/02/2012    TDAP status: Due, Education has been provided regarding the importance of this vaccine. Advised may receive this vaccine at local pharmacy or Health Dept. Aware to provide a copy of the vaccination record if obtained from local pharmacy or Health Dept. Verbalized acceptance and understanding.  Flu Vaccine status: Up to date  Pneumococcal vaccine status: Declined,  Education has been provided regarding the importance of this vaccine but patient still declined. Advised may receive this vaccine at local pharmacy or Health Dept. Aware to provide a  copy of the vaccination record if obtained from local pharmacy or Health Dept. Verbalized acceptance and understanding.   Covid-19 vaccine status: Completed vaccines  Qualifies for Shingles Vaccine? Yes   Zostavax completed Yes   Shingrix Completed?: Yes  Screening Tests Health Maintenance  Topic Date Due   DTaP/Tdap/Td (2 - Td or Tdap) 12/27/2020   INFLUENZA VACCINE  07/27/2023   COVID-19 Vaccine (7 - 2023-24 season) 08/27/2023   Pneumonia Vaccine 43+ Years old (2 of 2 - PCV) 10/27/2023 (Originally 11/18/2014)   MAMMOGRAM  11/05/2023   Medicare Annual Wellness (AWV)  09/19/2024   Colonoscopy  06/25/2028   DEXA SCAN  Completed   Zoster Vaccines- Shingrix  Completed   Hepatitis C Screening  Addressed   HPV VACCINES  Aged Out    Health Maintenance  Health Maintenance Due  Topic Date Due   DTaP/Tdap/Td (2 - Td or Tdap) 12/27/2020   INFLUENZA VACCINE  07/27/2023   COVID-19 Vaccine (7 - 2023-24 season) 08/27/2023    Colorectal cancer screening: Type of screening: Colonoscopy. Completed 06/26/23. Repeat every 5 years- aged out  Mammogram status: Completed 11/04/22. Repeat every year- **has one scheduled for 10/31/23**  Bone Density  status: Completed 10/11/18. Results reflect: Bone density results: OSTEOPOROSIS. Repeat every 2 years.  Lung Cancer Screening: (Low Dose CT Chest recommended if Age 78-80 years, 20 pack-year currently smoking OR have quit w/in 15years.) does not qualify.    Additional Screening:  Hepatitis C Screening: does qualify; Completed 07/07/16  Vision Screening: Recommended annual ophthalmology exams for early detection of glaucoma and other disorders of the eye. Is the patient up to date with their annual eye exam?  Yes  Who is the provider or what is the name of the office in which the patient attends annual eye exams? Dr.King If pt is not established with a provider, would they like to be referred to a provider to establish care? No .   Dental Screening:  Recommended annual dental exams for proper oral hygiene   Community Resource Referral / Chronic Care Management: CRR required this visit?  No   CCM required this visit?  No     Plan:     I have personally reviewed and noted the following in the patient's chart:   Medical and social history Use of alcohol, tobacco or illicit drugs  Current medications and supplements including opioid prescriptions. Patient is not currently taking opioid prescriptions. Functional ability and status Nutritional status Physical activity Advanced directives List of other physicians Hospitalizations, surgeries, and ER visits in previous 12 months Vitals Screenings to include cognitive, depression, and falls Referrals and appointments  In addition, I have reviewed and discussed with patient certain preventive protocols, quality metrics, and best practice recommendations. A written personalized care plan for preventive services as well as general preventive health recommendations were provided to patient.     Hal Hope, LPN   01/25/8656   After Visit Summary: my chart  Nurse Notes: none

## 2023-10-16 ENCOUNTER — Other Ambulatory Visit: Payer: Self-pay | Admitting: Internal Medicine

## 2023-10-16 DIAGNOSIS — E782 Mixed hyperlipidemia: Secondary | ICD-10-CM

## 2023-10-30 ENCOUNTER — Encounter: Payer: Self-pay | Admitting: Internal Medicine

## 2023-10-30 ENCOUNTER — Ambulatory Visit (INDEPENDENT_AMBULATORY_CARE_PROVIDER_SITE_OTHER): Payer: Medicare HMO | Admitting: Internal Medicine

## 2023-10-30 VITALS — BP 110/78 | HR 79 | Ht 66.0 in | Wt 135.0 lb

## 2023-10-30 DIAGNOSIS — Z0000 Encounter for general adult medical examination without abnormal findings: Secondary | ICD-10-CM

## 2023-10-30 DIAGNOSIS — M810 Age-related osteoporosis without current pathological fracture: Secondary | ICD-10-CM | POA: Diagnosis not present

## 2023-10-30 DIAGNOSIS — E041 Nontoxic single thyroid nodule: Secondary | ICD-10-CM | POA: Diagnosis not present

## 2023-10-30 DIAGNOSIS — F325 Major depressive disorder, single episode, in full remission: Secondary | ICD-10-CM | POA: Diagnosis not present

## 2023-10-30 DIAGNOSIS — E782 Mixed hyperlipidemia: Secondary | ICD-10-CM

## 2023-10-30 DIAGNOSIS — Z1231 Encounter for screening mammogram for malignant neoplasm of breast: Secondary | ICD-10-CM

## 2023-10-30 DIAGNOSIS — I10 Essential (primary) hypertension: Secondary | ICD-10-CM | POA: Diagnosis not present

## 2023-10-30 MED ORDER — BUPROPION HCL 75 MG PO TABS
75.0000 mg | ORAL_TABLET | Freq: Every day | ORAL | 0 refills | Status: DC
Start: 2023-10-30 — End: 2023-11-20

## 2023-10-30 NOTE — Assessment & Plan Note (Addendum)
Clinically stable on current regimen of Bupropion with fair control of symptoms, No SI or HI. She does feel that her emotions are very flat.  She has been on current med for years Will try bupropion 75 mg once a day - message if helpful or not.

## 2023-10-30 NOTE — Assessment & Plan Note (Signed)
LDL is  Lab Results  Component Value Date   LDLCALC 87 04/27/2023    Current regimen is pravachol.  Tolerating medications well without issues.

## 2023-10-30 NOTE — Patient Instructions (Signed)
Call [facility] to schedule your DEXA - bone density-  at [phone].

## 2023-10-30 NOTE — Assessment & Plan Note (Signed)
Last DEXA 2019 Due for repeat - current therapy is none

## 2023-10-30 NOTE — Assessment & Plan Note (Signed)
Controlled BP with normal exam. Current regimen is losartan and hctz. Will continue same medications; encourage continued reduced sodium diet.

## 2023-10-30 NOTE — Assessment & Plan Note (Signed)
Followed by Endo On low dose levothyroxine

## 2023-10-30 NOTE — Progress Notes (Addendum)
Date:  10/30/2023   Name:  Krista Peters   DOB:  [birth year]   MRN:  [id number]   Chief Complaint: Annual Exam Krista Peters is a 77 y.o. female who presents today for her Complete Annual Exam. She feels well. She reports exercising walking 3 times a week. She reports she is sleeping well. Breast complaints none.  Mammogram: 10/2022 [facility] DEXA: 09/2018 osteoporosis Colonoscopy: 06/2023 repeat 5 yrs  Health Maintenance Due  Topic Date Due   DTaP/Tdap/Td (2 - Td or Tdap) 12/27/2020    Immunization History  Administered Date(s) Administered   Fluad Quad(high Dose 65+) 09/10/2019, 09/14/2022, 10/11/2023   Influenza, High Dose Seasonal PF 09/25/2013, 10/30/2018   Influenza,inj,Quad PF,6+ Mos 09/11/2015, 09/20/2016, 08/31/2017   Influenza-Unspecified 10/30/2018, 10/15/2020, 10/01/2021   Moderna Covid-19 Vaccine Bivalent Booster 57yrs & up 10/01/2021   Moderna SARS-COV2 Booster Vaccination 11/03/2020, 04/12/2021   Moderna Sars-Covid-2 Vaccination 01/30/2020, 02/27/2020, 10/11/2023   Pfizer(Comirnaty)Fall Seasonal Vaccine 12 years and older 09/16/2022   Pneumococcal Polysaccharide-23 12/28/2011, 11/18/2013   Tdap 12/27/2010   Zoster Recombinant(Shingrix) 06/09/2018, 08/28/2018   Zoster, Live 08/02/2012     Hypertension This is a chronic problem. The problem is controlled. Pertinent negatives include no chest pain, headaches, palpitations or shortness of breath. Past treatments include angiotensin blockers and diuretics. There is no history of kidney disease, CAD/MI or CVA.  Hyperlipidemia This is a chronic problem. The problem is controlled. Pertinent negatives include no chest pain or shortness of breath. Current antihyperlipidemic treatment includes statins.  Depression        This is a chronic problem.The problem is unchanged.  Associated symptoms include no fatigue and no headaches.   Review of Systems  Constitutional:  Negative for chills, fatigue and fever.   HENT:  Negative for congestion, hearing loss, tinnitus, trouble swallowing and voice change.   Eyes:  Negative for visual disturbance.  Respiratory:  Negative for cough, chest tightness, shortness of breath and wheezing.   Cardiovascular:  Negative for chest pain, palpitations and leg swelling.  Gastrointestinal:  Negative for abdominal pain, constipation, diarrhea and vomiting.  Endocrine: Negative for polydipsia and polyuria.  Genitourinary:  Negative for dysuria, frequency, genital sores, vaginal bleeding and vaginal discharge.  Musculoskeletal:  Negative for arthralgias, gait problem and joint swelling.  Skin:  Negative for color change and rash.  Neurological:  Negative for dizziness, tremors, light-headedness and headaches.  Hematological:  Negative for adenopathy. Does not bruise/bleed easily.  Psychiatric/Behavioral:  Positive for depression. Negative for dysphoric mood and sleep disturbance. The patient is not nervous/anxious.      Lab Results  Component Value Date   NA 141 04/27/2023   K 4.9 04/27/2023   CO2 25 04/27/2023   GLUCOSE 92 04/27/2023   BUN 10 04/27/2023   CREATININE 0.77 04/27/2023   CALCIUM 9.3 04/27/2023   EGFR 80 04/27/2023   GFRNONAA >60 12/30/2022   Lab Results  Component Value Date   CHOL 174 04/27/2023   HDL 69 04/27/2023   LDLCALC 87 04/27/2023   TRIG 98 04/27/2023   CHOLHDL 2.5 04/27/2023   Lab Results  Component Value Date   TSH 1.870 10/21/2020   No results found for: "HGBA1C" Lab Results  Component Value Date   WBC 5.3 10/26/2022   HGB 14.2 10/26/2022   HCT 41.9 10/26/2022   MCV 93 10/26/2022   PLT 315 10/26/2022   Lab Results  Component Value Date   ALT 14 04/27/2023   AST 15 04/27/2023  ALKPHOS 83 04/27/2023   BILITOT 0.4 04/27/2023   Lab Results  Component Value Date   VD25OH 62.0 10/25/2021     Patient Active Problem List   Diagnosis Date Noted   History of colonic polyps 06/26/2023   Mitral regurgitation  04/27/2023   Essential hypertension 04/27/2023   Nontoxic single thyroid nodule 09/16/2019   Mass of skin of left hand 04/28/2018   Osteoporosis of multiple sites without pathological fracture 10/21/2016   Urge incontinence of urine 09/11/2015   Major depression, single episode, in complete remission (HCC) 09/11/2015   Mixed hyperlipidemia 09/11/2015   Irritable bowel syndrome without diarrhea 09/11/2015   Hx of adenomatous colonic polyps 08/27/2014   Adhesive capsulitis 03/26/2014   Disequilibrium 06/13/2008    Allergies  Allergen Reactions   Lipitor [Atorvastatin] Other (See Comments)    Leg pain   Naproxen Rash   Other Rash    OTC "cold pills" cause rash.  Liquid meds are OK   Penicillins Rash   Pneumovax 23 [Pneumococcal Vac Polyvalent] Rash    Past Surgical History:  Procedure Laterality Date   APPENDECTOMY     BIOPSY THYROID  2020   BUNIONECTOMY Left 02/27/2019   Procedure: DOUBLE OSTECTOMY;  Surgeon: Gwyneth Revels, DPM;  Location[facility];  Service: Podiatry;  Laterality: Left;  GENERAL WITH LOCAL   CATARACT EXTRACTION W/PHACO Right 01/16/2023   Procedure: CATARACT EXTRACTION PHACO AND INTRAOCULAR LENS PLACEMENT (IOC) RIGHT EYHANCE TORIC  6.10  00:35.9;  Surgeon: Nevada Crane, MD;  Location: [facility];  Service: Ophthalmology;  Laterality: Right;   CATARACT EXTRACTION W/PHACO Left 01/30/2023   Procedure: CATARACT EXTRACTION PHACO AND INTRAOCULAR LENS PLACEMENT (IOC) LEFT EYHANCE TORIC 4.81 00:28.2;  Surgeon: Nevada Crane, MD;  Location: [facility];  Service: Ophthalmology;  Laterality: Left;   COLONOSCOPY  2015   Large adenoma removed laparoscopically   COLONOSCOPY WITH PROPOFOL N/A 11/06/2017   Procedure: COLONOSCOPY WITH PROPOFOL;  Surgeon: Midge Minium, MD;  Location: [facility];  Service: Endoscopy;  Laterality: N/A;   COLONOSCOPY WITH PROPOFOL N/A 06/26/2023   Procedure: COLONOSCOPY WITH PROPOFOL;  Surgeon: Midge Minium, MD;  Location: [facility];  Service: Endoscopy;  Laterality: N/A;   LAPAROSCOPIC ILEOCECECTOMY  2015   Serrated adenoma   TONSILLECTOMY      Social History   Tobacco Use   Smoking status: Former    Current packs/day: 0.00    Average packs/day: 0.3 packs/day for 10.0 years (2.5 ttl pk-yrs)    Types: Cigarettes    Start date: 70    Quit date: 1970    Years since quitting: 54.8   Smokeless tobacco: Never   Tobacco comments:    smoking cessation materials not required  Vaping Use   Vaping status: Never Used  Substance Use Topics   Alcohol use: Yes    Alcohol/week: 10.0 standard drinks of alcohol    Types: 5 Glasses of wine, 5 Cans of beer per week    Comment: 1 beer or wine per day   Drug use: Never     Medication list has been reviewed and updated.  Current Meds  Medication Sig   buPROPion (WELLBUTRIN) 75 MG tablet Take 1 tablet (75 mg total) by mouth daily at 6 (six) AM.   carvedilol (COREG) 12.5 MG tablet Take 1.5 tablets (18.75 mg total) by mouth 2 (two) times daily.   levothyroxine (SYNTHROID) 25 MCG tablet Take 25 mcg by mouth daily before breakfast.   losartan (  COZAAR) 100 MG tablet Take 1 tablet (100 mg total) by mouth daily.   OVER THE COUNTER MEDICATION Macular shield (vitamins)   oxybutynin (DITROPAN) 5 MG tablet TAKE 1 TABLET(5 MG) BY MOUTH DAILY   pravastatin (PRAVACHOL) 10 MG tablet TAKE 1 TABLET(10 MG) BY MOUTH DAILY   [DISCONTINUED] buPROPion (WELLBUTRIN XL) 150 MG 24 hr tablet TAKE 1 TABLET(150 MG) BY MOUTH DAILY       10/30/2023    8:00 AM 08/15/2023    1:43 PM 04/27/2023    8:06 AM 10/26/2022    8:05 AM  GAD 7 : Generalized Anxiety Score  Nervous, Anxious, on Edge 0 0 0 0  Control/stop worrying 0 0 0 0  Worry too much - different things 0 0 1 0  Trouble relaxing 1 0 1 0  Restless 0 0 0 0  Easily annoyed or irritable 0 0 0 0  Afraid - awful might happen 0 0 0 0  Total GAD 7 Score 1 0 2 0  Anxiety Difficulty Not difficult at all  Not difficult at all Not difficult at all Not difficult at all       10/30/2023    8:00 AM 09/20/2023    9:38 AM 08/15/2023    1:43 PM  Depression screen PHQ 2/9  Decreased Interest 1 0 0  Down, Depressed, Hopeless 0 0 0  PHQ - 2 Score 1 0 0  Altered sleeping 0 0 0  Tired, decreased energy 1 0 0  Change in appetite 0 0 0  Feeling bad or failure about yourself  0 0 0  Trouble concentrating 0 0 0  Moving slowly or fidgety/restless 0 0 0  Suicidal thoughts 0 0 0  PHQ-9 Score 2 0 0  Difficult doing work/chores Not difficult at all Not difficult at all Not difficult at all    BP Readings from Last 3 Encounters:  10/30/23 110/78  09/20/23 132/70  08/15/23 122/78    Physical Exam Vitals and nursing note reviewed.  Constitutional:      General: She is not in acute distress.    Appearance: She is well-developed.  HENT:     Head: Normocephalic and atraumatic.     Right Ear: Tympanic membrane and ear canal normal.     Left Ear: Tympanic membrane and ear canal normal.     Nose:     Right Sinus: No maxillary sinus tenderness.     Left Sinus: No maxillary sinus tenderness.  Eyes:     General: No scleral icterus.       Right eye: No discharge.        Left eye: No discharge.     Conjunctiva/sclera: Conjunctivae normal.  Neck:     Thyroid: No thyromegaly.     Vascular: No carotid bruit.  Cardiovascular:     Rate and Rhythm: Normal rate and regular rhythm.     Pulses: Normal pulses.     Heart sounds: Normal heart sounds.  Pulmonary:     Effort: Pulmonary effort is normal. No respiratory distress.     Breath sounds: No wheezing.  Chest:  Breasts:    Right: No mass, nipple discharge, skin change or tenderness.     Left: No mass, nipple discharge, skin change or tenderness.  Abdominal:     General: Bowel sounds are normal.     Palpations: Abdomen is soft.     Tenderness: There is no abdominal tenderness.  Musculoskeletal:        General: Normal  range of motion.     Cervical  back: Normal range of motion. No erythema.     Right lower leg: No edema.     Left lower leg: No edema.  Lymphadenopathy:     Cervical: No cervical adenopathy.  Skin:    General: Skin is warm and dry.     Capillary Refill: Capillary refill takes less than 2 seconds.     Findings: No rash.  Neurological:     General: No focal deficit present.     Mental Status: She is alert and oriented to person, place, and time.     Cranial Nerves: No cranial nerve deficit.     Sensory: No sensory deficit.     Deep Tendon Reflexes: Reflexes are normal and symmetric.  Psychiatric:        Attention and Perception: Attention normal.        Mood and Affect: Mood normal.     Wt Readings from Last 3 Encounters:  10/30/23 135 lb (61.2 kg)  09/20/23 134 lb (60.8 kg)  08/15/23 135 lb (61.2 kg)    BP 110/78   Pulse 79   Ht 5\' 6"  (1.676 m)   Wt 135 lb (61.2 kg)   SpO2 99%   BMI 21.79 kg/m   Assessment and Plan:  Problem List Items Addressed This Visit       Unprioritized   Major depression, single episode, in complete remission (HCC) (Chronic)    Clinically stable on current regimen of Bupropion with fair control of symptoms, No SI or HI. She does feel that her emotions are very flat.  She has been on current med for years Will try bupropion 75 mg once a day - message if helpful or not.        Relevant Medications   buPROPion (WELLBUTRIN) 75 MG tablet   Mixed hyperlipidemia (Chronic)    LDL is  Lab Results  Component Value Date   LDLCALC 87 04/27/2023    Current regimen is pravachol.  Tolerating medications well without issues.       Relevant Orders   Lipid panel   Osteoporosis of multiple sites without pathological fracture (Chronic)    Last DEXA 2019 Due for repeat - current therapy is none      Nontoxic single thyroid nodule (Chronic)    Followed by Endo On low dose levothyroxine      Essential hypertension    Controlled BP with normal exam. Current regimen is  losartan and hctz. Will continue same medications; encourage continued reduced sodium diet.       Relevant Orders   CBC with Differential/Platelet   Comprehensive metabolic panel   Urinalysis, Routine w reflex microscopic   Other Visit Diagnoses     Annual physical exam    -  Primary   she declines Prevnar-20 due to prior reaction   Encounter for screening mammogram for breast cancer       scheduled at [facility]       No follow-ups on file.    Reubin Milan, MD [facility] [place]

## 2023-10-31 LAB — COMPREHENSIVE METABOLIC PANEL
ALT: 10 [IU]/L (ref 0–32)
AST: 15 [IU]/L (ref 0–40)
Albumin: 4.4 g/dL (ref 3.8–4.8)
Alkaline Phosphatase: 79 [IU]/L (ref 44–121)
BUN/Creatinine Ratio: 16 (ref 12–28)
BUN: 11 mg/dL (ref 8–27)
Bilirubin Total: 0.5 mg/dL (ref 0.0–1.2)
CO2: 26 mmol/L (ref 20–29)
Calcium: 9 mg/dL (ref 8.7–10.3)
Chloride: 101 mmol/L (ref 96–106)
Creatinine, Ser: 0.69 mg/dL (ref 0.57–1.00)
Globulin, Total: 2 g/dL (ref 1.5–4.5)
Glucose: 97 mg/dL (ref 70–99)
Potassium: 4.3 mmol/L (ref 3.5–5.2)
Sodium: 140 mmol/L (ref 134–144)
Total Protein: 6.4 g/dL (ref 6.0–8.5)
eGFR: 90 mL/min/{1.73_m2} (ref >59)

## 2023-10-31 LAB — URINALYSIS, ROUTINE W REFLEX MICROSCOPIC
Bilirubin, UA: NEGATIVE
Glucose, UA: NEGATIVE
Ketones, UA: NEGATIVE
Leukocytes,UA: NEGATIVE
Nitrite, UA: NEGATIVE
Protein,UA: NEGATIVE
RBC, UA: NEGATIVE
Specific Gravity, UA: 1.014 (ref 1.005–1.030)
Urobilinogen, Ur: 0.2 mg/dL (ref 0.2–1.0)
pH, UA: 6 (ref 5.0–7.5)

## 2023-10-31 LAB — LIPID PANEL
Chol/HDL Ratio: 2.6 ratio (ref 0.0–4.4)
Cholesterol, Total: 169 mg/dL (ref 100–199)
HDL: 65 mg/dL (ref >39)
LDL Chol Calc (NIH): 91 mg/dL (ref 0–99)
Triglycerides: 69 mg/dL (ref 0–149)
VLDL Cholesterol Cal: 13 mg/dL (ref 5–40)

## 2023-10-31 LAB — CBC WITH DIFFERENTIAL/PLATELET
Basophils Absolute: 0.1 10*3/uL (ref 0.0–0.2)
Basos: 1 %
EOS (ABSOLUTE): 0.2 10*3/uL (ref 0.0–0.4)
Eos: 4 %
Hematocrit: 39.3 % (ref 34.0–46.6)
Hemoglobin: 12.6 g/dL (ref 11.1–15.9)
Immature Grans (Abs): 0 10*3/uL (ref 0.0–0.1)
Immature Granulocytes: 0 %
Lymphocytes Absolute: 1.8 10*3/uL (ref 0.7–3.1)
Lymphs: 34 %
MCH: 31.8 pg (ref 26.6–33.0)
MCHC: 32.1 g/dL (ref 31.5–35.7)
MCV: 99 fL — ABNORMAL HIGH (ref 79–97)
Monocytes Absolute: 0.5 10*3/uL (ref 0.1–0.9)
Monocytes: 9 %
Neutrophils Absolute: 2.7 10*3/uL (ref 1.4–7.0)
Neutrophils: 52 %
Platelets: 229 10*3/uL (ref 150–450)
RBC: 3.96 x10E6/uL (ref 3.77–5.28)
RDW: 12.4 % (ref 11.7–15.4)
WBC: 5.3 10*3/uL (ref 3.4–10.8)

## 2023-11-02 ENCOUNTER — Encounter: Payer: Self-pay | Admitting: Nurse Practitioner

## 2023-11-02 ENCOUNTER — Ambulatory Visit: Payer: Medicare HMO | Attending: Nurse Practitioner | Admitting: Nurse Practitioner

## 2023-11-02 VITALS — BP 134/72 | HR 54 | Ht 66.0 in | Wt 134.8 lb

## 2023-11-02 DIAGNOSIS — I428 Other cardiomyopathies: Secondary | ICD-10-CM | POA: Diagnosis not present

## 2023-11-02 DIAGNOSIS — I5022 Chronic systolic (congestive) heart failure: Secondary | ICD-10-CM

## 2023-11-02 DIAGNOSIS — I340 Nonrheumatic mitral (valve) insufficiency: Secondary | ICD-10-CM

## 2023-11-02 DIAGNOSIS — I10 Essential (primary) hypertension: Secondary | ICD-10-CM

## 2023-11-02 NOTE — Progress Notes (Signed)
Office Visit    Patient Name: Krista Peters Date of Encounter: 11/02/2023  Primary Care Provider:  Reubin Milan, MD Primary Cardiologist:  Debbe Odea, MD  Chief Complaint    77 y.o. female w/ a h/o NICM, HFmrEF, nl coronary arteries, HTN, HL, diastolic dysfxn, hypothyroidism, mild-mod MR/TR, and depression, who presents for f/u r/t HTN.   Past Medical History  Subjective   Past Medical History:  Diagnosis Date   Depression    Grade I diastolic dysfunction    Hearing aid worn    bilateral   Hypertension    Hypothyroidism    nodules on thyroid   Mitral regurgitation    a. 11/2022 Echo: Mild-mod MR.   Mixed hyperlipidemia    NICM (nonischemic cardiomyopathy) (HCC)    a. 11/2022 Echo: EF 45-50%, glob HK, GrI DD, nl RV size/fxn, mild-mod MR/TR; b. 01/2023 Cor CTA: Ca2+ = 0. Nl cors.   Nodule of lower lobe of right lung    a. 01/2023 CT chest: 4mm RLL nodule->no f/u if low-risk.   Tricuspid regurgitation    a. 11/2022 Echo: Mild-mod TR.   Past Surgical History:  Procedure Laterality Date   APPENDECTOMY     BIOPSY THYROID  2020   BUNIONECTOMY Left 02/27/2019   Procedure: DOUBLE OSTECTOMY;  Surgeon: Gwyneth Revels, DPM;  Location: [facility];  Service: Podiatry;  Laterality: Left;  GENERAL WITH LOCAL   CATARACT EXTRACTION W/PHACO Right 01/16/2023   Procedure: CATARACT EXTRACTION PHACO AND INTRAOCULAR LENS PLACEMENT (IOC) RIGHT EYHANCE TORIC  6.10  00:35.9;  Surgeon: Nevada Crane, MD;  Location: [facility];  Service: Ophthalmology;  Laterality: Right;   CATARACT EXTRACTION W/PHACO Left 01/30/2023   Procedure: CATARACT EXTRACTION PHACO AND INTRAOCULAR LENS PLACEMENT (IOC) LEFT EYHANCE TORIC 4.81 00:28.2;  Surgeon: Nevada Crane, MD;  Location: [facility];  Service: Ophthalmology;  Laterality: Left;   COLONOSCOPY  2015   Large adenoma removed laparoscopically   COLONOSCOPY WITH PROPOFOL N/A 11/06/2017   Procedure: COLONOSCOPY WITH  PROPOFOL;  Surgeon: Midge Minium, MD;  Location: [facility];  Service: Endoscopy;  Laterality: N/A;   COLONOSCOPY WITH PROPOFOL N/A 06/26/2023   Procedure: COLONOSCOPY WITH PROPOFOL;  Surgeon: Midge Minium, MD;  Location: [facility];  Service: Endoscopy;  Laterality: N/A;   LAPAROSCOPIC ILEOCECECTOMY  2015   Serrated adenoma   TONSILLECTOMY      Allergies  Allergies  Allergen Reactions   Lipitor [Atorvastatin] Other (See Comments)    Leg pain   Naproxen Rash   Other Rash    OTC "cold pills" cause rash.  Liquid meds are OK   Penicillins Rash   Pneumovax 23 [Pneumococcal Vac Polyvalent] Rash      History of Present Illness     77 y.o. y/o female w/ a h/o NICM, nl coronary arteries, HTN, HL, diastolic dysfxn, hypothyroidism, mild-mod MR/TR, and depression.  She previously established care in 12/2022 following echo in 11/2022, which showed mildly reduced EF @ 45-50% w/ mild-mod MR/TR.  Coronary CTA was performed and showed nl coronary arteries w/ calcium score of 0.  She was incidentally noted to have a 4mm RLL nodule.  She was subsequently placed on ? blocker and ARB.  Beta-blocker therapy was transition from metoprolol to carvedilol in June 2024 in the setting of elevated blood pressures.     Krista Peters was last seen in cardiology clinic in August 2024, at which time blood pressure was elevated and her carvedilol was  increased to 12.5 mg twice daily.  Blood pressure at primary care visit on November 4 was 110/78.  At home, pressures have been similar to what was seen at primary care though occasionally, she will have a blood pressure in the 140s.  She has not been checking very frequently.  She does feel well and has tolerated the titration of carvedilol well.  She denies chest pain, dyspnea, palpitations, PND, orthopnea, dizziness, syncope, edema, or early satiety. Objective  Home Medications    Current Outpatient Medications  Medication Sig Dispense Refill   buPROPion  (WELLBUTRIN) 75 MG tablet Take 1 tablet (75 mg total) by mouth daily at 6 (six) AM. 30 tablet 0   carvedilol (COREG) 12.5 MG tablet Take 1.5 tablets (18.75 mg total) by mouth 2 (two) times daily. 270 tablet 3   levothyroxine (SYNTHROID) 25 MCG tablet Take 25 mcg by mouth daily before breakfast.     losartan (COZAAR) 100 MG tablet Take 1 tablet (100 mg total) by mouth daily. 90 tablet 1   OVER THE COUNTER MEDICATION Macular shield (vitamins)     oxybutynin (DITROPAN) 5 MG tablet TAKE 1 TABLET(5 MG) BY MOUTH DAILY 90 tablet 3   pravastatin (PRAVACHOL) 10 MG tablet TAKE 1 TABLET(10 MG) BY MOUTH DAILY 90 tablet 1   No current facility-administered medications for this visit.     Physical Exam    VS:  BP 134/72   Pulse (!) 54   Ht 5\' 6"  (1.676 m)   Wt 134 lb 12.8 oz (61.1 kg)   SpO2 99%   BMI 21.76 kg/m  , BMI Body mass index is 21.76 kg/m.       GEN: Well nourished, well developed, in no acute distress. HEENT: normal. Neck: Supple, no JVD, carotid bruits, or masses. Cardiac: RRR, no murmurs, rubs, or gallops. No clubbing, cyanosis, edema.  Radials 2+/PT 2+ and equal bilaterally.  Respiratory:  Respirations regular and unlabored, clear to auscultation bilaterally. GI: Soft, nontender, nondistended, BS + x 4. MS: no deformity or atrophy. Skin: warm and dry, no rash. Neuro:  Strength and sensation are intact. Psych: Normal affect.  Accessory Clinical Findings    ECG personally reviewed by me today - EKG Interpretation Date/Time:  Thursday November 02 2023 10:27:44 EST Ventricular Rate:  54 PR Interval:  164 QRS Duration:  84 QT Interval:  424 QTC Calculation: 402 R Axis:   60  Text Interpretation: Sinus bradycardia Confirmed by Nicolasa Ducking [phone]) on 11/02/2023 10:32:02 AM  - no acute changes.  Lab Results  Component Value Date   WBC 5.3 10/30/2023   HGB 12.6 10/30/2023   HCT 39.3 10/30/2023   MCV 99 (H) 10/30/2023   PLT 229 10/30/2023   Lab Results  Component  Value Date   CREATININE 0.69 10/30/2023   BUN 11 10/30/2023   NA 140 10/30/2023   K 4.3 10/30/2023   CL 101 10/30/2023   CO2 26 10/30/2023   Lab Results  Component Value Date   ALT 10 10/30/2023   AST 15 10/30/2023   ALKPHOS 79 10/30/2023   BILITOT 0.5 10/30/2023   Lab Results  Component Value Date   CHOL 169 10/30/2023   HDL 65 10/30/2023   LDLCALC 91 10/30/2023   TRIG 69 10/30/2023   CHOLHDL 2.6 10/30/2023    No results found for: "HGBA1C" Lab Results  Component Value Date   TSH 1.870 10/21/2020       Assessment & Plan    1.  Primary hypertension:  Pressure elevated at follow-up visit in August, prompting titration of carvedilol.  Pressures overall improved at home, frequently in the 1 teens.  She was initially 140/72 today however on repeat, she came down to 134/72.  Continue current dose of carvedilol and losartan.  2.  Nonischemic cardiomyopathy/chronic heart failure with midrange ejection fraction: Echo in December 2023 notable for an EF of 45 to 50% with mild to moderate MR and TR.  Calcium score was 0 with normal coronary arteries by CTA in January 2024.  She is euvolemic on examination and asymptomatic at home.  Continue beta-blocker and ARB.  3.  Hyperlipidemia: LDL of 87 in May 2024.  Normal coronary arteries.  She remains on pravastatin therapy.  4.  Mild to moderate mitral regurgitation/tricuspid regurgitation: This was noted on echo December 2023.  Plan for follow-up echo in the next 1 to 2 years.  5.  Disposition: Follow-up in clinic in 6 months or sooner if necessary.  Nicolasa Ducking, NP 11/02/2023, 10:55 AM

## 2023-11-02 NOTE — Patient Instructions (Signed)
Medication Instructions:  Your Physician recommend you continue on your current medication as directed.    *If you need a refill on your cardiac medications before your next appointment, please call your pharmacy*   Lab Work: None ordered.  If you have labs (blood work) drawn today and your tests are completely normal, you will receive your results only by: MyChart Message (if you have MyChart) OR A paper copy in the mail If you have any lab test that is abnormal or we need to change your treatment, we will call you to review the results.   Testing/Procedures: None ordered.   Follow-Up: At [facility], you and your health needs are our priority.  As part of our continuing mission to provide you with exceptional heart care, we have created designated Provider Care Teams.  These Care Teams include your primary Cardiologist (physician) and Advanced Practice Providers (APPs -  Physician Assistants and Nurse Practitioners) who all work together to provide you with the care you need, when you need it.  We recommend signing up for the patient portal called "MyChart".  Sign up information is provided on this After Visit Summary.  MyChart is used to connect with patients for Virtual Visits (Telemedicine).  Patients are able to view lab/test results, encounter notes, upcoming appointments, etc.  Non-urgent messages can be sent to your provider as well.   To learn more about what you can do with MyChart, go to [email or web address].    Your next appointment:   6 month(s)  Provider:   You may see Debbe Odea, MD or one of the following Advanced Practice Providers on your designated Care Team:   Nicolasa Ducking, NP

## 2023-11-07 DIAGNOSIS — Z1231 Encounter for screening mammogram for malignant neoplasm of breast: Secondary | ICD-10-CM | POA: Diagnosis not present

## 2023-11-13 DIAGNOSIS — Z859 Personal history of malignant neoplasm, unspecified: Secondary | ICD-10-CM | POA: Diagnosis not present

## 2023-11-13 DIAGNOSIS — L821 Other seborrheic keratosis: Secondary | ICD-10-CM | POA: Diagnosis not present

## 2023-11-13 DIAGNOSIS — L578 Other skin changes due to chronic exposure to nonionizing radiation: Secondary | ICD-10-CM | POA: Diagnosis not present

## 2023-11-13 DIAGNOSIS — Z872 Personal history of diseases of the skin and subcutaneous tissue: Secondary | ICD-10-CM | POA: Diagnosis not present

## 2023-11-13 DIAGNOSIS — L570 Actinic keratosis: Secondary | ICD-10-CM | POA: Diagnosis not present

## 2023-11-13 DIAGNOSIS — L942 Calcinosis cutis: Secondary | ICD-10-CM | POA: Diagnosis not present

## 2023-11-13 DIAGNOSIS — Z86018 Personal history of other benign neoplasm: Secondary | ICD-10-CM | POA: Diagnosis not present

## 2023-11-20 ENCOUNTER — Encounter: Payer: Self-pay | Admitting: Internal Medicine

## 2023-11-20 ENCOUNTER — Other Ambulatory Visit: Payer: Self-pay | Admitting: Internal Medicine

## 2023-11-20 DIAGNOSIS — F325 Major depressive disorder, single episode, in full remission: Secondary | ICD-10-CM

## 2023-11-20 MED ORDER — BUPROPION HCL 75 MG PO TABS: 75.0000 mg | ORAL_TABLET | Freq: Every day | ORAL | 1 refills | Status: DC

## 2023-12-13 ENCOUNTER — Ambulatory Visit
Admission: RE | Admit: 2023-12-13 | Discharge: 2023-12-13 | Disposition: A | Discharge status: Home / Self Care | Payer: Medicare HMO | Source: Ambulatory Visit | Attending: Internal Medicine | Admitting: Internal Medicine

## 2023-12-13 DIAGNOSIS — Z780 Asymptomatic menopausal state: Secondary | ICD-10-CM | POA: Diagnosis not present

## 2023-12-13 DIAGNOSIS — M810 Age-related osteoporosis without current pathological fracture: Secondary | ICD-10-CM | POA: Diagnosis not present

## 2023-12-16 ENCOUNTER — Other Ambulatory Visit: Payer: Self-pay | Admitting: Internal Medicine

## 2023-12-16 DIAGNOSIS — F325 Major depressive disorder, single episode, in full remission: Secondary | ICD-10-CM

## 2023-12-16 DIAGNOSIS — I10 Essential (primary) hypertension: Secondary | ICD-10-CM

## 2023-12-18 NOTE — Telephone Encounter (Signed)
Requested Prescriptions  Pending Prescriptions Disp Refills   oxybutynin (DITROPAN) 5 MG tablet [Pharmacy Med Name: OXYBUTYNIN 5MG  TABLETS] 90 tablet 3    Sig: TAKE 1 TABLET(5 MG) BY MOUTH DAILY     Urology:  Bladder Agents Passed - 12/18/2023  1:32 PM      Passed - Valid encounter within last 12 months    Recent Outpatient Visits           1 month ago Annual physical exam   [place] [facility] at [facility], Nyoka Cowden, MD   4 months ago Non-recurrent acute suppurative otitis media of left ear with spontaneous rupture of tympanic membrane   [place] [facility], [facility]   7 months ago Mitral valve insufficiency, unspecified etiology   [place] [facility] at [facility], Nyoka Cowden, MD   1 year ago Annual physical exam   [place] [facility] at [facility], Nyoka Cowden, MD   1 year ago Essential hypertension   [place] [facility] at [facility], Nyoka Cowden, MD       Future Appointments             In 10 months Reubin Milan, MD [facility] at [facility], [facility]             losartan (COZAAR) 100 MG tablet [Pharmacy Med Name: LOSARTAN 100MG  TABLETS] 90 tablet 1    Sig: TAKE 1 TABLET(100 MG) BY MOUTH DAILY     Cardiovascular:  Angiotensin Receptor Blockers Passed - 12/18/2023  1:32 PM      Passed - Cr in normal range and within 180 days    Creatinine  Date Value Ref Range Status  10/14/2014 0.56 (L) 0.60 - 1.30 mg/dL Final   Creatinine, Ser  Date Value Ref Range Status  10/30/2023 0.69 0.57 - 1.00 mg/dL Final         Passed - K in normal range and within 180 days    Potassium  Date Value Ref Range Status  10/30/2023 4.3 3.5 - 5.2 mmol/L Final  10/14/2014 3.8 3.5 - 5.1 mmol/L Final         Passed - Patient is not pregnant      Passed - Last  BP in normal range    BP Readings from Last 1 Encounters:  11/02/23 134/72         Passed - Valid encounter within last 6 months    Recent Outpatient Visits           1 month ago Annual physical exam   [place] [facility] at [facility], Nyoka Cowden, MD   4 months ago Non-recurrent acute suppurative otitis media of left ear with spontaneous rupture of tympanic membrane   [place] [facility], MD   7 months ago Mitral valve insufficiency, unspecified etiology   [place] [facility] at [facility], Nyoka Cowden, MD   1 year ago Annual physical exam   [facility] at [facility], Nyoka Cowden, MD   1 year ago Essential hypertension   [facility] at [facility], Nyoka Cowden, MD  Future Appointments             In 10 months Judithann Graves, Nyoka Cowden, MD [facility] at [facility], [facility]            Refused Prescriptions Disp Refills   buPROPion (WELLBUTRIN XL) 150 MG 24 hr tablet [Pharmacy Med Name: BUPROPION XL 150MG  TABLETS (24 H)] 90 tablet 3    Sig: TAKE 1 TABLET(150 MG) BY MOUTH DAILY     Psychiatry: Antidepressants - bupropion Passed - 12/18/2023  1:32 PM      Passed - Cr in normal range and within 360 days    Creatinine  Date Value Ref Range Status  10/14/2014 0.56 (L) 0.60 - 1.30 mg/dL Final   Creatinine, Ser  Date Value Ref Range Status  10/30/2023 0.69 0.57 - 1.00 mg/dL Final         Passed - AST in normal range and within 360 days    AST  Date Value Ref Range Status  10/30/2023 15 0 - 40 IU/L Final   SGOT(AST)  Date Value Ref Range Status  10/09/2014 12 (L) 15 - 37 Unit/L Final         Passed - ALT in normal range and within 360 days    ALT  Date Value Ref Range Status  10/30/2023 10 0 - 32 IU/L Final   SGPT (ALT)  Date  Value Ref Range Status  10/09/2014 19 U/L Final    Comment:    14-63 NOTE: New Reference Range 07/15/14          Passed - Completed PHQ-2 or PHQ-9 in the last 360 days      Passed - Last BP in normal range    BP Readings from Last 1 Encounters:  11/02/23 134/72         Passed - Valid encounter within last 6 months    Recent Outpatient Visits           1 month ago Annual physical exam   [place] [facility] at [facility], Nyoka Cowden, MD   4 months ago Non-recurrent acute suppurative otitis media of left ear with spontaneous rupture of tympanic membrane   [place] [facility], MD   7 months ago Mitral valve insufficiency, unspecified etiology   [place] [facility] at [facility], Nyoka Cowden, MD   1 year ago Annual physical exam   [facility] at [facility], Nyoka Cowden, MD   1 year ago Essential hypertension   [place] [facility] at [facility], Nyoka Cowden, MD       Future Appointments             In 10 months Judithann Graves Nyoka Cowden, MD [facility] at [facility], [facility]

## 2024-04-04 ENCOUNTER — Ambulatory Visit: Payer: Medicare (Managed Care) | Attending: Nurse Practitioner | Admitting: Nurse Practitioner

## 2024-04-04 ENCOUNTER — Encounter: Payer: Self-pay | Admitting: Nurse Practitioner

## 2024-04-04 VITALS — BP 142/82 | HR 67 | Ht 66.0 in | Wt 135.0 lb

## 2024-04-04 DIAGNOSIS — I10 Essential (primary) hypertension: Secondary | ICD-10-CM | POA: Diagnosis not present

## 2024-04-04 DIAGNOSIS — I340 Nonrheumatic mitral (valve) insufficiency: Secondary | ICD-10-CM | POA: Diagnosis not present

## 2024-04-04 DIAGNOSIS — E782 Mixed hyperlipidemia: Secondary | ICD-10-CM

## 2024-04-04 DIAGNOSIS — I5022 Chronic systolic (congestive) heart failure: Secondary | ICD-10-CM

## 2024-04-04 DIAGNOSIS — I428 Other cardiomyopathies: Secondary | ICD-10-CM

## 2024-04-04 MED ORDER — CARVEDILOL 25 MG PO TABS: 25.0000 mg | ORAL_TABLET | Freq: Two times a day (BID) | ORAL | 3 refills | Status: AC

## 2024-04-04 NOTE — Progress Notes (Signed)
 Office Visit    Patient Name: Krista Peters Date of Encounter: 04/04/2024  Primary Care Provider:  Reubin Milan, MD Primary Cardiologist:  Krista Odea, MD  Chief Complaint    78 y.o. female w/ a h/o NICM, HFmrEF, nl coronary arteries, HTN, HL, diastolic dysfxn, hypothyroidism, mild-mod MR/TR, and depression,  who presents for heart failure and hypertension follow-up.  Past Medical History  Subjective   Past Medical History:  Diagnosis Date   Depression    Grade I diastolic dysfunction    Hearing aid worn    bilateral   Hypertension    Hypothyroidism    nodules on thyroid   Mitral regurgitation    a. 11/2022 Echo: Mild-mod MR.   Mixed hyperlipidemia    NICM (nonischemic cardiomyopathy) (HCC)    a. 11/2022 Echo: EF 45-50%, glob HK, GrI DD, nl RV size/fxn, mild-mod MR/TR; b. 01/2023 Cor CTA: Ca2+ = 0. Nl cors.   Nodule of lower lobe of right lung    a. 01/2023 CT chest: 4mm RLL nodule->no f/u if low-risk.   Tricuspid regurgitation    a. 11/2022 Echo: Mild-mod TR.   Past Surgical History:  Procedure Laterality Date   APPENDECTOMY     BIOPSY THYROID  2020   BUNIONECTOMY Left 02/27/2019   Procedure: DOUBLE OSTECTOMY;  Surgeon: Krista Peters, DPM;  Location: [facility];  Service: Podiatry;  Laterality: Left;  GENERAL WITH LOCAL   CATARACT EXTRACTION W/PHACO Right 01/16/2023   Procedure: CATARACT EXTRACTION PHACO AND INTRAOCULAR LENS PLACEMENT (IOC) RIGHT EYHANCE TORIC  6.10  00:35.9;  Surgeon: Krista Crane, MD;  Location: [facility];  Service: Ophthalmology;  Laterality: Right;   CATARACT EXTRACTION W/PHACO Left 01/30/2023   Procedure: CATARACT EXTRACTION PHACO AND INTRAOCULAR LENS PLACEMENT (IOC) LEFT EYHANCE TORIC 4.81 00:28.2;  Surgeon: Krista Crane, MD;  Location: [facility];  Service: Ophthalmology;  Laterality: Left;   COLONOSCOPY  2015   Large adenoma removed laparoscopically   COLONOSCOPY WITH PROPOFOL N/A 11/06/2017    Procedure: COLONOSCOPY WITH PROPOFOL;  Surgeon: Krista Minium, MD;  Location: [facility];  Service: Endoscopy;  Laterality: N/A;   COLONOSCOPY WITH PROPOFOL N/A 06/26/2023   Procedure: COLONOSCOPY WITH PROPOFOL;  Surgeon: Krista Minium, MD;  Location: [facility];  Service: Endoscopy;  Laterality: N/A;   LAPAROSCOPIC ILEOCECECTOMY  2015   Serrated adenoma   TONSILLECTOMY      Allergies  Allergies  Allergen Reactions   Lipitor [Atorvastatin] Other (See Comments)    Leg pain   Naproxen Rash   Other Rash    OTC "cold pills" cause rash.  Liquid meds are OK   Penicillins Rash   Pneumovax 23 [Pneumococcal Vac Polyvalent] Rash      History of Present Illness      78 y.o. y/o female w/ a h/o NICM, nl coronary arteries, HTN, HL, diastolic dysfxn, hypothyroidism, mild-mod MR/TR, and depression.  She previously established care in 12/2022 following echo in 11/2022, which showed mildly reduced EF @ 45-50% w/ mild-mod MR/TR.  Coronary CTA was performed and showed nl coronary arteries w/ calcium score of 0.  She was incidentally noted to have a 4mm RLL nodule.  She was subsequently placed on ? blocker and ARB.  Beta-blocker therapy was transition from metoprolol to carvedilol in June 2024 in the setting of elevated blood pressures.     [name] was last seen in cardiology clinic in November 2024, at which time she was doing well.  She remains active, walking with her husband several days a week.  He has Alzheimer's and so they are not able to walk for very long time.  She does not experience chest pain or dyspnea.  Her weight has been stable and she denies palpitations, PND, orthopnea, dizziness, syncope, edema, or early satiety.  She did have an episode about a month ago where she awoke in the middle of the night with tightness in her upper back.  She did not have any chest pain, dyspnea, or other associated symptoms.  Symptoms lasted about 20 minutes and resolve spontaneously.  She has  not had any recurrence of similar symptoms. Objective  Home Medications    Current Outpatient Medications  Medication Sig Dispense Refill   buPROPion (WELLBUTRIN) 75 MG tablet Take 1 tablet (75 mg total) by mouth daily at 6 (six) AM. 90 tablet 1   carvedilol (COREG) 12.5 MG tablet Take 1.5 tablets (18.75 mg total) by mouth 2 (two) times daily. 270 tablet 3   levothyroxine (SYNTHROID) 25 MCG tablet Take 25 mcg by mouth daily before breakfast.     losartan (COZAAR) 100 MG tablet TAKE 1 TABLET(100 MG) BY MOUTH DAILY 90 tablet 1   OVER THE COUNTER MEDICATION Macular shield (vitamins)     oxybutynin (DITROPAN) 5 MG tablet TAKE 1 TABLET(5 MG) BY MOUTH DAILY 90 tablet 3   pravastatin (PRAVACHOL) 10 MG tablet TAKE 1 TABLET(10 MG) BY MOUTH DAILY 90 tablet 1   No current facility-administered medications for this visit.     Physical Exam    VS:  BP (!) 156/88   Pulse 67   Ht 5\' 6"  (1.676 m)   Wt 135 lb (61.2 kg)   SpO2 98%   BMI 21.79 kg/m  , BMI Body mass index is 21.79 kg/m.     Vitals:   04/04/24 0854 04/04/24 0929  BP: (!) 156/88 (!) 142/82  Pulse: 67   SpO2: 98%       GEN: Well nourished, well developed, in no acute distress. HEENT: normal. Neck: Supple, no JVD, carotid bruits, or masses. Cardiac: RRR, no murmurs, rubs, or gallops. No clubbing, cyanosis, edema.  Radials 2+/PT 2+ and equal bilaterally.  Respiratory:  Respirations regular and unlabored, clear to auscultation bilaterally. GI: Soft, nontender, nondistended, BS + x 4. MS: no deformity or atrophy. Skin: warm and dry, no rash. Neuro:  Strength and sensation are intact. Psych: Normal affect.  Accessory Clinical Findings    ECG personally reviewed by me today - EKG Interpretation Date/Time:  Thursday April 04 2024 08:57:12 EDT Ventricular Rate:  67 PR Interval:  148 QRS Duration:  86 QT Interval:  412 QTC Calculation: 435 R Axis:   92  Text Interpretation: Normal sinus rhythm Rightward axis Nonspecific T  wave abnormality Confirmed by Nicolasa Ducking [phone]) on 04/04/2024 9:01:22 AM   Lab Results  Component Value Date   WBC 5.3 10/30/2023   HGB 12.6 10/30/2023   HCT 39.3 10/30/2023   MCV 99 (H) 10/30/2023   PLT 229 10/30/2023   Lab Results  Component Value Date   CREATININE 0.69 10/30/2023   BUN 11 10/30/2023   NA 140 10/30/2023   K 4.3 10/30/2023   CL 101 10/30/2023   CO2 26 10/30/2023   Lab Results  Component Value Date   ALT 10 10/30/2023   AST 15 10/30/2023   ALKPHOS 79 10/30/2023   BILITOT 0.5 10/30/2023   Lab Results  Component Value Date   CHOL 169 10/30/2023  HDL 65 10/30/2023   LDLCALC 91 10/30/2023   TRIG 69 10/30/2023   CHOLHDL 2.6 10/30/2023    Lab Results  Component Value Date   TSH 1.870 10/21/2020       Assessment & Plan    1.  Nonischemic cardiomyopathy/chronic heart failure with midrange ejection fraction: Echo in December 2023 notable for midrange EF of 45-50% with mild to moderate MR and TR.  Calcium score was 0 with normal coronary arteries by CTA in January 2024.  She is euvolemic on examination.  She had an episode of tightness in her upper back about a month ago that lasted 20 minutes resolved spontaneously.  Walking regularly without symptoms or limitations, and no recurrence of upper back symptoms.  ECG with anterolateral T wave inversion.  Arranging for repeat echocardiogram to reevaluate LV function.  Continue beta-blocker and ARB.  Titrating carvedilol in the setting of hypertension.  2.  Primary hypertension: Blood pressure elevated on 2 recordings today.  She has most been trending in the mid 130s at home with occasional peaks in the 150-160 range.  Increasing carvedilol to 25 mg twice daily.  Continue losartan.  3.  Hyperlipidemia: LDL of 91 in November 2024 with normal LFTs.  Normal coronary arteries in May 2024.  She remains on pravastatin therapy.  4.  Mild to moderate mitral regurgitation/tricuspid regurgitation: Noted on echo in  December 2023.  I did not appreciate a murmur on exam today.  As above, arranging for echo.  5.  Disposition: Follow-up echo.  She will continue to follow blood pressures at home.  Follow-up in 6 months or sooner if necessary.  Nicolasa Ducking, NP 04/04/2024, 9:02 AM

## 2024-04-04 NOTE — Patient Instructions (Signed)
 Medication Instructions:   Increase Carvedilol to 25 MG twice daily.   *If you need a refill on your cardiac medications before your next appointment, please call your pharmacy*  Lab Work: None ordered. If you have labs (blood work) drawn today and your tests are completely normal, you will receive your results only by: MyChart Message (if you have MyChart) OR A paper copy in the mail If you have any lab test that is abnormal or we need to change your treatment, we will call you to review the results.  Testing/Procedures: Your physician has requested that you have an echocardiogram. Echocardiography is a painless test that uses sound waves to create images of your heart. It provides your doctor with information about the size and shape of your heart and how well your heart's chambers and valves are working.   You may receive an ultrasound enhancing agent through an IV if needed to better visualize your heart during the echo. This procedure takes approximately one hour.  There are no restrictions for this procedure.  This will take place at [address] [facility] [address], [place] [address]  Please note: We ask at that you not bring children with you during ultrasound (echo/ vascular) testing. Due to room size and safety concerns, children are not allowed in the ultrasound rooms during exams. Our front office staff cannot provide observation of children in our lobby area while testing is being conducted. An adult accompanying a patient to their appointment will only be allowed in the ultrasound room at the discretion of the ultrasound technician under special circumstances. We apologize for any inconvenience.   Follow-Up: At [facility], you and your health needs are our priority.  As part of our continuing mission to provide you with exceptional heart care, our providers are all part of one team.  This team includes your primary Cardiologist (physician) and Advanced  Practice Providers or APPs (Physician Assistants and Nurse Practitioners) who all work together to provide you with the care you need, when you need it.  Your next appointment:   6 month(s)  Provider:   Debbe Odea, MD or Nicolasa Ducking, NP    We recommend signing up for the patient portal called "MyChart".  Sign up information is provided on this After Visit Summary.  MyChart is used to connect with patients for Virtual Visits (Telemedicine).  Patients are able to view lab/test results, encounter notes, upcoming appointments, etc.  Non-urgent messages can be sent to your provider as well.   To learn more about what you can do with MyChart, go to [email or web address].

## 2024-04-07 ENCOUNTER — Other Ambulatory Visit: Payer: Self-pay | Admitting: Internal Medicine

## 2024-04-07 DIAGNOSIS — E782 Mixed hyperlipidemia: Secondary | ICD-10-CM

## 2024-04-09 NOTE — Telephone Encounter (Signed)
 Requested Prescriptions  Pending Prescriptions Disp Refills   pravastatin (PRAVACHOL) 10 MG tablet [Pharmacy Med Name: PRAVASTATIN 10MG  TABLETS] 90 tablet 1    Sig: TAKE 1 TABLET(10 MG) BY MOUTH DAILY     Cardiovascular:  Antilipid - Statins Failed - 04/09/2024  9:20 AM      Failed - Valid encounter within last 12 months    Recent Outpatient Visits   None     Future Appointments             In 5 months Marvel Slicker, Eugene Hertz, NP [place] [facility] [place]   In 6 months Sheron Dixons, MD [facility] Primary Care & Sports Medicine at [facility], [facility]            Failed - Lipid Panel in normal range within the last 12 months    Cholesterol, Total  Date Value Ref Range Status  10/30/2023 169 100 - 199 mg/dL Final   LDL Chol Calc (NIH)  Date Value Ref Range Status  10/30/2023 91 0 - 99 mg/dL Final   HDL  Date Value Ref Range Status  10/30/2023 65 >39 mg/dL Final   Triglycerides  Date Value Ref Range Status  10/30/2023 69 0 - 149 mg/dL Final         Passed - Patient is not pregnant

## 2024-04-10 ENCOUNTER — Ambulatory Visit: Admitting: Nurse Practitioner

## 2024-04-30 ENCOUNTER — Ambulatory Visit: Payer: Medicare (Managed Care) | Attending: Nurse Practitioner

## 2024-04-30 DIAGNOSIS — I340 Nonrheumatic mitral (valve) insufficiency: Secondary | ICD-10-CM

## 2024-04-30 LAB — ECHOCARDIOGRAM COMPLETE
AR max vel: 1.86 cm2
AV Area VTI: 1.87 cm2
AV Area mean vel: 1.85 cm2
AV Mean grad: 2 mmHg
AV Peak grad: 4.2 mmHg
Ao pk vel: 1.02 m/s
Area-P 1/2: 3.42 cm2
S' Lateral: 3.27 cm

## 2024-05-20 ENCOUNTER — Other Ambulatory Visit: Payer: Self-pay | Admitting: Internal Medicine

## 2024-05-20 DIAGNOSIS — F325 Major depressive disorder, single episode, in full remission: Secondary | ICD-10-CM

## 2024-05-22 NOTE — Telephone Encounter (Signed)
 Medication refill

## 2024-05-22 NOTE — Telephone Encounter (Signed)
 Requested medication (s) are due for refill today: yes   Requested medication (s) are on the active medication list: yes   Last refill:  11/20/23 #90 1 refills   Future visit scheduled: yes in 5 months  Notes to clinic:  last OV 10/30/23. Greater than 6 months for last OV. Future visit 10/31/24. Do you want to continue refills until future visit or schedule earlier OV for refills?     Requested Prescriptions  Pending Prescriptions Disp Refills   buPROPion  (WELLBUTRIN ) 75 MG tablet [Pharmacy Med Name: BUPROPION  75MG  TABLETS] 90 tablet 1    Sig: TAKE 1 TABLET(75 MG) BY MOUTH DAILY AT 6 AM     Psychiatry: Antidepressants - bupropion  Failed - 05/22/2024  4:11 PM      Failed - Last BP in normal range    BP Readings from Last 1 Encounters:  04/04/24 (!) 142/82         Failed - Valid encounter within last 6 months    Recent Outpatient Visits   None     Future Appointments             In 4 months Marvel Slicker, Eugene Hertz, NP [place] [facility] [place]   In 5 months Sheron Dixons, MD [facility] & [facility] Medicine at [facility], [facility]            Passed - Cr in normal range and within 360 days    Creatinine  Date Value Ref Range Status  10/14/2014 0.56 (L) 0.60 - 1.30 mg/dL Final   Creatinine, Ser  Date Value Ref Range Status  10/30/2023 0.69 0.57 - 1.00 mg/dL Final         Passed - AST in normal range and within 360 days    AST  Date Value Ref Range Status  10/30/2023 15 0 - 40 IU/L Final   SGOT(AST)  Date Value Ref Range Status  10/09/2014 12 (L) 15 - 37 Unit/L Final         Passed - ALT in normal range and within 360 days    ALT  Date Value Ref Range Status  10/30/2023 10 0 - 32 IU/L Final   SGPT (ALT)  Date Value Ref Range Status  10/09/2014 19 U/L Final    Comment:    14-63 NOTE: New Reference Range 07/15/14          Passed - Completed PHQ-2 or PHQ-9 in the last 360 days

## 2024-06-11 ENCOUNTER — Other Ambulatory Visit: Payer: Self-pay | Admitting: Internal Medicine

## 2024-06-11 DIAGNOSIS — I10 Essential (primary) hypertension: Secondary | ICD-10-CM

## 2024-06-13 NOTE — Telephone Encounter (Signed)
 Courtesy refill. Patient will need an office visit for additional refills.  Requested Prescriptions  Pending Prescriptions Disp Refills   losartan  (COZAAR ) 100 MG tablet [Pharmacy Med Name: LOSARTAN  100MG  TABLETS] 30 tablet 0    Sig: TAKE 1 TABLET(100 MG) BY MOUTH DAILY     Cardiovascular:  Angiotensin Receptor Blockers Failed - 06/13/2024  4:01 PM      Failed - Cr in normal range and within 180 days    Creatinine  Date Value Ref Range Status  10/14/2014 0.56 (L) 0.60 - 1.30 mg/dL Final   Creatinine, Ser  Date Value Ref Range Status  10/30/2023 0.69 0.57 - 1.00 mg/dL Final         Failed - K in normal range and within 180 days    Potassium  Date Value Ref Range Status  10/30/2023 4.3 3.5 - 5.2 mmol/L Final  10/14/2014 3.8 3.5 - 5.1 mmol/L Final         Failed - Last BP in normal range    BP Readings from Last 1 Encounters:  04/04/24 (!) 142/82         Failed - Valid encounter within last 6 months    Recent Outpatient Visits   None     Future Appointments             In 3 months Krista Peters, Krista Hertz, NP [place] [facility] [place]   In 4 months Krista Jubilee Chales [place], MD [facility] Primary Care & Sports Medicine at [facility], [facility]            Passed - Patient is not pregnant

## 2024-06-14 ENCOUNTER — Encounter: Payer: Self-pay | Admitting: Internal Medicine

## 2024-06-14 ENCOUNTER — Ambulatory Visit (INDEPENDENT_AMBULATORY_CARE_PROVIDER_SITE_OTHER): Payer: Medicare (Managed Care) | Admitting: Internal Medicine

## 2024-06-14 VITALS — BP 126/78 | HR 63 | Ht 66.0 in | Wt 136.0 lb

## 2024-06-14 DIAGNOSIS — R490 Dysphonia: Secondary | ICD-10-CM

## 2024-06-14 DIAGNOSIS — N3941 Urge incontinence: Secondary | ICD-10-CM | POA: Diagnosis not present

## 2024-06-14 DIAGNOSIS — F325 Major depressive disorder, single episode, in full remission: Secondary | ICD-10-CM

## 2024-06-14 DIAGNOSIS — I10 Essential (primary) hypertension: Secondary | ICD-10-CM | POA: Diagnosis not present

## 2024-06-14 DIAGNOSIS — E782 Mixed hyperlipidemia: Secondary | ICD-10-CM

## 2024-06-14 MED ORDER — LOSARTAN POTASSIUM 100 MG PO TABS
100.0000 mg | ORAL_TABLET | Freq: Every day | ORAL | 1 refills | Status: AC
Start: 2024-06-14 — End: ?

## 2024-06-14 NOTE — Assessment & Plan Note (Signed)
 LDL is  Lab Results  Component Value Date   LDLCALC 91 10/30/2023   Current regimen is pravastatin .  No medication side effects noted. Goal LDL is <100.

## 2024-06-14 NOTE — Assessment & Plan Note (Addendum)
 Blood pressure is well controlled.  Current medications losartan  and Coreg .  Home readings 170-60 Will continue same regimen along with efforts to limit dietary sodium.

## 2024-06-14 NOTE — Assessment & Plan Note (Signed)
 Managed fairly well with Oxybutynin .  Having very mild constant leakage. Would consider Pelvic floor PT if desired.

## 2024-06-14 NOTE — Assessment & Plan Note (Addendum)
 Clinically stable on lower dose bupropion  75 mg.   No SI or HI on evaluation. Some stress dealing with her husbands poor health and dementia. Plan to continue same medications for now.

## 2024-06-14 NOTE — Progress Notes (Signed)
 Date:  06/14/2024   Name:  Krista Peters   DOB:  10-Aug-1946   MRN:  [id number]   Chief Complaint: Hypertension, Hypothyroidism, and Hyperlipidemia  Hypertension This is a chronic problem. The problem is controlled. Pertinent negatives include no chest pain, headaches, palpitations or shortness of breath. Past treatments include angiotensin blockers and beta blockers. The current treatment provides significant improvement. There is no history of kidney disease, CAD/MI or CVA.  Hyperlipidemia This is a chronic problem. The problem is controlled. Recent lipid tests were reviewed and are normal. Pertinent negatives include no chest pain, myalgias or shortness of breath. Current antihyperlipidemic treatment includes statins. The current treatment provides significant improvement of lipids.  Depression        This is a chronic problem.The problem is unchanged.  Associated symptoms include no decreased concentration, no fatigue, no myalgias and no headaches.  Treatments tried: did well with reduced dose of Bupropion . Hoarseness - has notice some AM phlegm and hoarse voice throughout the day.  No sore throat, no reflux, no dysphagia.  She was using Flonase but stopped recently due to thoughts that the pollen was minimal.  Review of Systems  Constitutional:  Negative for fatigue and unexpected weight change.  HENT:  Positive for voice change.   Eyes:  Negative for visual disturbance.  Respiratory:  Negative for cough, chest tightness, shortness of breath and wheezing.   Cardiovascular:  Negative for chest pain, palpitations and leg swelling.  Gastrointestinal:  Negative for abdominal pain, constipation and diarrhea.  Musculoskeletal:  Negative for arthralgias and myalgias.  Neurological:  Negative for dizziness, weakness, light-headedness and headaches.  Psychiatric/Behavioral:  Positive for depression. Negative for decreased concentration, hallucinations and sleep disturbance. The patient is not  nervous/anxious.      Lab Results  Component Value Date   NA 140 10/30/2023   K 4.3 10/30/2023   CO2 26 10/30/2023   GLUCOSE 97 10/30/2023   BUN 11 10/30/2023   CREATININE 0.69 10/30/2023   CALCIUM  9.0 10/30/2023   EGFR 90 10/30/2023   GFRNONAA >60 12/30/2022   Lab Results  Component Value Date   CHOL 169 10/30/2023   HDL 65 10/30/2023   LDLCALC 91 10/30/2023   TRIG 69 10/30/2023   CHOLHDL 2.6 10/30/2023   Lab Results  Component Value Date   TSH 1.870 10/21/2020   No results found for: HGBA1C Lab Results  Component Value Date   WBC 5.3 10/30/2023   HGB 12.6 10/30/2023   HCT 39.3 10/30/2023   MCV 99 (H) 10/30/2023   PLT 229 10/30/2023   Lab Results  Component Value Date   ALT 10 10/30/2023   AST 15 10/30/2023   ALKPHOS 79 10/30/2023   BILITOT 0.5 10/30/2023   Lab Results  Component Value Date   VD25OH 62.0 10/25/2021     Patient Active Problem List   Diagnosis Date Noted   History of colonic polyps 06/26/2023   Mitral regurgitation 04/27/2023   Essential hypertension 04/27/2023   Nontoxic single thyroid  nodule 09/16/2019   Mass of skin of left hand 04/28/2018   Osteoporosis of multiple sites without pathological fracture 10/21/2016   Urge incontinence of urine 09/11/2015   Major depression, single episode, in complete remission (HCC) 09/11/2015   Mixed hyperlipidemia 09/11/2015   Irritable bowel syndrome without diarrhea 09/11/2015   Hx of adenomatous colonic polyps 08/27/2014   Adhesive capsulitis 03/26/2014   Disequilibrium 06/13/2008    Allergies  Allergen Reactions   Lipitor [Atorvastatin ] Other (See Comments)  Leg pain   Naproxen Rash   Other Rash    OTC cold pills cause rash.  Liquid meds are OK   Penicillins Rash   Pneumovax 23 [Pneumococcal Vac Polyvalent] Rash    Past Surgical History:  Procedure Laterality Date   APPENDECTOMY     BIOPSY THYROID   2020   BUNIONECTOMY Left 02/27/2019   Procedure: DOUBLE OSTECTOMY;  Surgeon:  Anell Baptist, DPM;  Location: [facility];  Service: Podiatry;  Laterality: Left;  GENERAL WITH LOCAL   CATARACT EXTRACTION W/PHACO Right 01/16/2023   Procedure: CATARACT EXTRACTION PHACO AND INTRAOCULAR LENS PLACEMENT (IOC) RIGHT EYHANCE TORIC  6.10  00:35.9;  Surgeon: Rosa College, MD;  Location: [facility];  Service: Ophthalmology;  Laterality: Right;   CATARACT EXTRACTION W/PHACO Left 01/30/2023   Procedure: CATARACT EXTRACTION PHACO AND INTRAOCULAR LENS PLACEMENT (IOC) LEFT EYHANCE TORIC 4.81 00:28.2;  Surgeon: Rosa College, MD;  Location: [facility];  Service: Ophthalmology;  Laterality: Left;   COLONOSCOPY  2015   Large adenoma removed laparoscopically   COLONOSCOPY WITH PROPOFOL  N/A 11/06/2017   Procedure: COLONOSCOPY WITH PROPOFOL ;  Surgeon: Marnee Sink, MD;  Location: [facility];  Service: Endoscopy;  Laterality: N/A;   COLONOSCOPY WITH PROPOFOL  N/A 06/26/2023   Procedure: COLONOSCOPY WITH PROPOFOL ;  Surgeon: Marnee Sink, MD;  Location: [facility];  Service: Endoscopy;  Laterality: N/A;   LAPAROSCOPIC ILEOCECECTOMY  2015   Serrated adenoma   TONSILLECTOMY      Social History   Tobacco Use   Smoking status: Former    Current packs/day: 0.00    Average packs/day: 0.3 packs/day for 10.0 years (2.5 ttl pk-yrs)    Types: Cigarettes    Start date: 38    Quit date: 1970    Years since quitting: 55.5   Smokeless tobacco: Never   Tobacco comments:    smoking cessation materials not required  Vaping Use   Vaping status: Never Used  Substance Use Topics   Alcohol use: Yes    Alcohol/week: 10.0 standard drinks of alcohol    Types: 5 Glasses of wine, 5 Cans of beer per week    Comment: 1 beer or wine per day   Drug use: Never     Medication list has been reviewed and updated.  Current Meds  Medication Sig   buPROPion  (WELLBUTRIN ) 75 MG tablet TAKE 1 TABLET(75 MG) BY MOUTH DAILY AT 6 AM   carvedilol  (COREG ) 25 MG tablet  Take 1 tablet (25 mg total) by mouth 2 (two) times daily.   levothyroxine (SYNTHROID) 25 MCG tablet Take 25 mcg by mouth daily before breakfast.   OVER THE COUNTER MEDICATION Macular shield (vitamins)   oxybutynin  (DITROPAN ) 5 MG tablet TAKE 1 TABLET(5 MG) BY MOUTH DAILY   pravastatin  (PRAVACHOL ) 10 MG tablet TAKE 1 TABLET(10 MG) BY MOUTH DAILY   [DISCONTINUED] losartan  (COZAAR ) 100 MG tablet TAKE 1 TABLET(100 MG) BY MOUTH DAILY       06/14/2024   10:22 AM 10/30/2023    8:00 AM 08/15/2023    1:43 PM 04/27/2023    8:06 AM  GAD 7 : Generalized Anxiety Score  Nervous, Anxious, on Edge 0 0 0 0  Control/stop worrying 0 0 0 0  Worry too much - different things 0 0 0 1  Trouble relaxing 0 1 0 1  Restless 0 0 0 0  Easily annoyed or irritable 0 0 0 0  Afraid - awful might happen 0 0 0 0  Total GAD 7 Score 0 1 0 2  Anxiety Difficulty Not difficult at all Not difficult at all Not difficult at all Not difficult at all       06/14/2024   10:22 AM 10/30/2023    8:00 AM 09/20/2023    9:38 AM  Depression screen PHQ 2/9  Decreased Interest 0 1 0  Down, Depressed, Hopeless 0 0 0  PHQ - 2 Score 0 1 0  Altered sleeping 0 0 0  Tired, decreased energy 0 1 0  Change in appetite 0 0 0  Feeling bad or failure about yourself  0 0 0  Trouble concentrating 0 0 0  Moving slowly or fidgety/restless 0 0 0  Suicidal thoughts 0 0 0  PHQ-9 Score 0 2 0  Difficult doing work/chores Not difficult at all Not difficult at all Not difficult at all    BP Readings from Last 3 Encounters:  06/14/24 126/78  04/04/24 (!) 142/82  11/02/23 134/72    Physical Exam Vitals and nursing note reviewed.  Constitutional:      General: She is not in acute distress.    Appearance: Normal appearance. She is well-developed. She is not ill-appearing.  HENT:     Head: Normocephalic and atraumatic.     Nose:     Right Sinus: No maxillary sinus tenderness or frontal sinus tenderness.     Left Sinus: No maxillary sinus  tenderness or frontal sinus tenderness.     Mouth/Throat:     Pharynx: Posterior oropharyngeal erythema present. No oropharyngeal exudate.  Neck:     Vascular: No carotid bruit.   Cardiovascular:     Rate and Rhythm: Normal rate and regular rhythm.     Heart sounds: No murmur heard. Pulmonary:     Effort: Pulmonary effort is normal. No respiratory distress.     Breath sounds: No wheezing or rhonchi.   Musculoskeletal:     Cervical back: Normal range of motion.     Right lower leg: No edema.     Left lower leg: No edema.  Lymphadenopathy:     Cervical: No cervical adenopathy.   Skin:    General: Skin is warm and dry.     Findings: No rash.   Neurological:     Mental Status: She is alert and oriented to person, place, and time.   Psychiatric:        Mood and Affect: Mood normal.        Behavior: Behavior normal.     Wt Readings from Last 3 Encounters:  06/14/24 136 lb (61.7 kg)  04/04/24 135 lb (61.2 kg)  11/02/23 134 lb 12.8 oz (61.1 kg)    BP 126/78   Pulse 63   Ht 5' 6 (1.676 m)   Wt 136 lb (61.7 kg)   SpO2 97%   BMI 21.95 kg/m   Assessment and Plan:  Problem List Items Addressed This Visit       Unprioritized   Major depression, single episode, in complete remission (HCC) (Chronic)   Clinically stable on lower dose bupropion  75 mg.   No SI or HI on evaluation. Some stress dealing with her husbands poor health and dementia. Plan to continue same medications for now.       Mixed hyperlipidemia (Chronic)   LDL is  Lab Results  Component Value Date   LDLCALC 91 10/30/2023   Current regimen is pravastatin .  No medication side effects noted. Goal LDL is <100.  Relevant Medications   losartan  (COZAAR ) 100 MG tablet   Essential hypertension - Primary (Chronic)   Blood pressure is well controlled.  Current medications losartan  and Coreg .  Home readings 170-60 Will continue same regimen along with efforts to limit dietary sodium.        Relevant Medications   losartan  (COZAAR ) 100 MG tablet   Urge incontinence of urine   Managed fairly well with Oxybutynin .  Having very mild constant leakage. Would consider Pelvic floor PT if desired.      Other Visit Diagnoses       Hoarseness of voice       likely due to PND - resume flonase or Claritin daily - if no improvement, see ENT       No follow-ups on file.    Sheron Dixons, MD [facility] [place]

## 2024-07-05 DIAGNOSIS — E041 Nontoxic single thyroid nodule: Secondary | ICD-10-CM | POA: Diagnosis not present

## 2024-07-05 DIAGNOSIS — H6983 Other specified disorders of Eustachian tube, bilateral: Secondary | ICD-10-CM | POA: Diagnosis not present

## 2024-07-05 DIAGNOSIS — R490 Dysphonia: Secondary | ICD-10-CM | POA: Diagnosis not present

## 2024-07-05 DIAGNOSIS — J329 Chronic sinusitis, unspecified: Secondary | ICD-10-CM | POA: Diagnosis not present

## 2024-09-03 DIAGNOSIS — H26493 Other secondary cataract, bilateral: Secondary | ICD-10-CM | POA: Diagnosis not present

## 2024-09-03 DIAGNOSIS — H353132 Nonexudative age-related macular degeneration, bilateral, intermediate dry stage: Secondary | ICD-10-CM | POA: Diagnosis not present

## 2024-09-03 DIAGNOSIS — Z961 Presence of intraocular lens: Secondary | ICD-10-CM | POA: Diagnosis not present

## 2024-09-03 DIAGNOSIS — D3131 Benign neoplasm of right choroid: Secondary | ICD-10-CM | POA: Diagnosis not present

## 2024-09-25 ENCOUNTER — Ambulatory Visit: Payer: Medicare (Managed Care)

## 2024-09-25 DIAGNOSIS — Z0000 Encounter for general adult medical examination without abnormal findings: Secondary | ICD-10-CM | POA: Diagnosis not present

## 2024-09-25 NOTE — Patient Instructions (Addendum)
 Krista Peters,  Thank you for taking the time for your Medicare Wellness Visit. I appreciate your continued commitment to your health goals. Please review the care plan we discussed, and feel free to reach out if I can assist you further.  Medicare recommends these wellness visits once per year to help you and your care team stay ahead of potential health issues. These visits are designed to focus on prevention, allowing your provider to concentrate on managing your acute and chronic conditions during your regular appointments.  Please note that Annual Wellness Visits do not include a physical exam. Some assessments may be limited, especially if the visit was conducted virtually. If needed, we may recommend a separate in-person follow-up with your provider.  Ongoing Care Seeing your primary care provider every 3 to 6 months helps us  monitor your health and provide consistent, personalized care.   Referrals If a referral was made during today's visit and you haven't received any updates within two weeks, please contact the referred provider directly to check on the status.  Recommended Screenings:  Health Maintenance  Topic Date Due   DTaP/Tdap/Td vaccine (2 - Td or Tdap) 12/27/2020   Flu Shot  07/26/2024   COVID-19 Vaccine (8 - 2025-26 season) 08/26/2024   Breast Cancer Screening  11/06/2024   Pneumococcal Vaccine for age over 83 (2 of 2 - PCV) 10/29/2024*   Medicare Annual Wellness Visit  09/25/2025   DEXA scan (bone density measurement)  12/12/2025   Colon Cancer Screening  06/25/2028   Zoster (Shingles) Vaccine  Completed   Hepatitis C Screening  Addressed   HPV Vaccine  Aged Out   Meningitis B Vaccine  Aged Out  *Topic was postponed. The date shown is not the original due date.       09/25/2024    9:51 AM  Advanced Directives  Does Patient Have a Medical Advance Directive? Yes  Type of Estate agent of [place];Living will  Does patient want to make  changes to medical advance directive? No - Patient declined  Copy of Healthcare Power of Attorney in Chart? Yes - validated most recent copy scanned in chart (See row information)   Advance Care Planning is important because it: Ensures you receive medical care that aligns with your values, goals, and preferences. Provides guidance to your family and loved ones, reducing the emotional burden of decision-making during critical moments.  Vision: Annual vision screenings are recommended for early detection of glaucoma, cataracts, and diabetic retinopathy. These exams can also reveal signs of chronic conditions such as diabetes and high blood pressure.  Dental: Annual dental screenings help detect early signs of oral cancer, gum disease, and other conditions linked to overall health, including heart disease and diabetes.  Please see the attached documents for additional preventive care recommendations.   NEXT AWV 10/01/25 @ 8:50 AM BY PHONE

## 2024-09-25 NOTE — Progress Notes (Signed)
 Subjective:   Krista Peters is a 78 y.o. who presents for a Medicare Wellness preventive visit.  As a reminder, Annual Wellness Visits don't include a physical exam, and some assessments may be limited, especially if this visit is performed virtually. We may recommend an in-person follow-up visit with your provider if needed.  Visit Complete: Virtual I connected with  Vance L Knope on 09/25/24 by a audio enabled telemedicine application and verified that I am speaking with the correct person using two identifiers.  Patient Location: Home  Provider Location: Home Office  I discussed the limitations of evaluation and management by telemedicine. The patient expressed understanding and agreed to proceed.  Vital Signs: Because this visit was a virtual/telehealth visit, some criteria may be missing or patient reported. Any vitals not documented were not able to be obtained and vitals that have been documented are patient reported.  VideoDeclined- This patient declined Librarian, academic. Therefore the visit was completed with audio only.  Persons Participating in Visit: Patient.  AWV Questionnaire: No: Patient Medicare AWV questionnaire was not completed prior to this visit.  Cardiac Risk Factors include: advanced age (>73men, >78 women);dyslipidemia;hypertension;sedentary lifestyle     Objective:    There were no vitals filed for this visit. There is no height or weight on file to calculate BMI.     09/25/2024    9:51 AM 09/20/2023    9:40 AM 06/26/2023    7:21 AM 01/30/2023    9:41 AM 01/16/2023   11:41 AM 09/14/2022    8:41 AM 09/13/2021   10:23 AM  Advanced Directives  Does Patient Have a Medical Advance Directive? Yes Yes Yes Yes Yes Yes Yes  Type of Estate agent of [place];Living will Healthcare Power of [place];Living will Healthcare Power of [place];Living will  Living will;Healthcare Power of [employer] Power of  [place];Living will  Does patient want to make changes to medical advance directive? No - Patient declined No - Patient declined    No - Patient declined   Copy of Healthcare Power of Attorney in Chart? Yes - validated most recent copy scanned in chart (See row information) Yes - validated most recent copy scanned in chart (See row information) Yes - validated most recent copy scanned in chart (See row information) No - copy requested No - copy requested  Yes - validated most recent copy scanned in chart (See row information)    Current Medications (verified) Outpatient Encounter Medications as of 09/25/2024  Medication Sig   buPROPion  (WELLBUTRIN ) 75 MG tablet TAKE 1 TABLET(75 MG) BY MOUTH DAILY AT 6 AM   carvedilol  (COREG ) 25 MG tablet Take 1 tablet (25 mg total) by mouth 2 (two) times daily.   levothyroxine (SYNTHROID) 25 MCG tablet Take 25 mcg by mouth daily before breakfast.   losartan  (COZAAR ) 100 MG tablet Take 1 tablet (100 mg total) by mouth daily.   oxybutynin  (DITROPAN ) 5 MG tablet TAKE 1 TABLET(5 MG) BY MOUTH DAILY   pravastatin  (PRAVACHOL ) 10 MG tablet TAKE 1 TABLET(10 MG) BY MOUTH DAILY   OVER THE COUNTER MEDICATION Macular shield (vitamins) (Patient not taking: Reported on 09/25/2024)   No facility-administered encounter medications on file as of 09/25/2024.    Allergies (verified) Lipitor [atorvastatin ], Naproxen, Other, Penicillins, and Pneumovax 23 [pneumococcal vac polyvalent]   History: Past Medical History:  Diagnosis Date   Depression    Grade I diastolic dysfunction    Hearing aid worn    bilateral  Hypertension    Hypothyroidism    nodules on thyroid    Mitral regurgitation    a. 11/2022 Echo: Mild-mod MR.   Mixed hyperlipidemia    NICM (nonischemic cardiomyopathy) (HCC)    a. 11/2022 Echo: EF 45-50%, glob HK, GrI DD, nl RV size/fxn, mild-mod MR/TR; b. 01/2023 Cor CTA: Ca2+ = 0. Nl cors.   Nodule of lower lobe of right lung    a. 01/2023 CT chest: 4mm RLL  nodule->no f/u if low-risk.   Tricuspid regurgitation    a. 11/2022 Echo: Mild-mod TR.   Past Surgical History:  Procedure Laterality Date   APPENDECTOMY     BIOPSY THYROID   2020   BUNIONECTOMY Left 02/27/2019   Procedure: DOUBLE OSTECTOMY;  Surgeon: Ashley Soulier, DPM;  Location: [facility];  Service: Podiatry;  Laterality: Left;  GENERAL WITH LOCAL   CATARACT EXTRACTION W/PHACO Right 01/16/2023   Procedure: CATARACT EXTRACTION PHACO AND INTRAOCULAR LENS PLACEMENT (IOC) RIGHT EYHANCE TORIC  6.10  00:35.9;  Surgeon: Myrna Adine Anes, MD;  Location: [facility];  Service: Ophthalmology;  Laterality: Right;   CATARACT EXTRACTION W/PHACO Left 01/30/2023   Procedure: CATARACT EXTRACTION PHACO AND INTRAOCULAR LENS PLACEMENT (IOC) LEFT EYHANCE TORIC 4.81 00:28.2;  Surgeon: Myrna Adine Anes, MD;  Location: [facility];  Service: Ophthalmology;  Laterality: Left;   COLONOSCOPY  2015   Large adenoma removed laparoscopically   COLONOSCOPY WITH PROPOFOL  N/A 11/06/2017   Procedure: COLONOSCOPY WITH PROPOFOL ;  Surgeon: Jinny Carmine, MD;  Location: [facility];  Service: Endoscopy;  Laterality: N/A;   COLONOSCOPY WITH PROPOFOL  N/A 06/26/2023   Procedure: COLONOSCOPY WITH PROPOFOL ;  Surgeon: Jinny Carmine, MD;  Location: [facility];  Service: Endoscopy;  Laterality: N/A;   LAPAROSCOPIC ILEOCECECTOMY  2015   Serrated adenoma   TONSILLECTOMY     Family History  Problem Relation Age of Onset   Diabetes Mother    CAD Father    Cardiomyopathy Father    Diabetes Brother    Cardiomyopathy Brother    CAD Brother    Social History   Socioeconomic History   Marital status: Married    Spouse name: Not on file   Number of children: 4   Years of education: Not on file   Highest education level: 12th grade  Occupational History   Occupation: Retired  Tobacco Use   Smoking status: Former    Current packs/day: 0.00    Average packs/day: 0.3 packs/day for 10.0  years (2.5 ttl pk-yrs)    Types: Cigarettes    Start date: 41    Quit date: 1970    Years since quitting: 55.7   Smokeless tobacco: Never   Tobacco comments:    smoking cessation materials not required  Vaping Use   Vaping status: Never Used  Substance and Sexual Activity   Alcohol use: Yes    Alcohol/week: 10.0 standard drinks of alcohol    Types: 5 Glasses of wine, 5 Cans of beer per week    Comment: 1 beer or wine per day   Drug use: Never   Sexual activity: Not Currently  Other Topics Concern   Not on file  Social History Narrative   Not on file   Social Drivers of Health   Financial Resource Strain: Low Risk  (09/25/2024)   Overall Financial Resource Strain (CARDIA)    Difficulty of Paying Living Expenses: Not hard at all  Food Insecurity: No Food Insecurity (09/25/2024)   Hunger Vital Sign    Worried  About Running Out of Food in the Last Year: Never true    Ran Out of Food in the Last Year: Never true  Transportation Needs: No Transportation Needs (09/25/2024)   PRAPARE - Administrator, Civil Service (Medical): No    Lack of Transportation (Non-Medical): No  Physical Activity: Inactive (09/25/2024)   Exercise Vital Sign    Days of Exercise per Week: 0 days    Minutes of Exercise per Session: 0 min  Stress: No Stress Concern Present (09/25/2024)   [employer] of Occupational Health - Occupational Stress Questionnaire    Feeling of Stress: Only a little  Social Connections: Moderately Integrated (09/25/2024)   Social Connection and Isolation Panel    Frequency of Communication with Friends and Family: More than three times a week    Frequency of Social Gatherings with Friends and Family: Once a week    Attends Religious Services: Never    Database administrator or Organizations: Yes    Attends Engineer, structural: More than 4 times per year    Marital Status: Married    Tobacco Counseling Counseling given: Not Answered Tobacco  comments: smoking cessation materials not required    Clinical Intake:  Pre-visit preparation completed: Yes  Pain : No/denies pain     BMI - recorded: 22 Nutritional Status: BMI of 19-24  Normal Nutritional Risks: None Diabetes: No  No results found for: HGBA1C   How often do you need to have someone help you when you read instructions, pamphlets, or other written materials from your doctor or pharmacy?: 1 - Never  Interpreter Needed?: No  Information entered by :: JHONNIE DAS, LPN   Activities of Daily Living     09/25/2024    9:52 AM 09/21/2024   12:07 PM  In your present state of health, do you have any difficulty performing the following activities:  Hearing? 0 0  Vision? 0 0  Difficulty concentrating or making decisions? 0 0  Walking or climbing stairs? 0 0  Dressing or bathing? 0 0  Doing errands, shopping? 0 0  Preparing Food and eating ? N N  Using the Toilet? N N  In the past six months, have you accidently leaked urine? N N  Do you have problems with loss of bowel control? N N  Managing your Medications? N N  Managing your Finances? N N  Housekeeping or managing your Housekeeping? N N    Patient Care Team: Justus Leita DEL, MD as PCP - General (Internal Medicine) Darliss Rogue, MD as PCP - Cardiology (Cardiology) [facility] as Consulting Physician (Dermatology) Myrna Adine Anes, MD as Consulting Physician (Ophthalmology)  I have updated your Care Teams any recent Medical Services you may have received from other providers in the past year.     Assessment:   This is a routine wellness examination for Katerra.  Hearing/Vision screen Hearing Screening - Comments:: WEARS AIDS, BOTH EARS Vision Screening - Comments:: WEARS BIFOCALS, HAD CATARACT SGY- DR.BRADLEY KING   Goals Addressed             This Visit's Progress    DIET - REDUCE SALT INTAKE TO 2 GRAMS PER DAY OR LESS         Depression Screen     09/25/2024     9:50 AM 06/14/2024   10:22 AM 10/30/2023    8:00 AM 09/20/2023    9:38 AM 08/15/2023    1:43 PM 04/27/2023    8:06  AM 10/26/2022    8:05 AM  PHQ 2/9 Scores  PHQ - 2 Score 0 0 1 0 0 0 0  PHQ- 9 Score 0 0 2 0 0 2 0    Fall Risk     09/21/2024   12:07 PM 06/14/2024   10:22 AM 10/30/2023    8:00 AM 09/20/2023    9:41 AM 09/17/2023    8:38 AM  Fall Risk   Falls in the past year? 0 0 0 0 0  Number falls in past yr: 0 0 0 0   Injury with Fall? 0 0 0 0   Risk for fall due to : No Fall Risks No Fall Risks No Fall Risks No Fall Risks   Follow up Falls evaluation completed;Falls prevention discussed Falls evaluation completed Falls evaluation completed Falls prevention discussed;Falls evaluation completed     MEDICARE RISK AT HOME:  Medicare Risk at Home Any stairs in or around the home?: No If so, are there any without handrails?: No Home free of loose throw rugs in walkways, pet beds, electrical cords, etc?: Yes Adequate lighting in your home to reduce risk of falls?: Yes Life alert?: No Use of a cane, walker or w/c?: No Grab bars in the bathroom?: Yes Shower chair or bench in shower?: Yes Elevated toilet seat or a handicapped toilet?: Yes  TIMED UP AND GO:  Was the test performed?  No  Cognitive Function: 6CIT completed        09/25/2024    9:53 AM 09/20/2023    9:42 AM 09/14/2022    8:42 AM 09/09/2019   10:21 AM 09/03/2018   10:30 AM  6CIT Screen  What Year? 0 points 0 points 0 points 0 points 0 points  What month? 0 points 0 points 0 points 0 points 0 points  What time? 0 points 0 points 0 points 0 points 0 points  Count back from 20 0 points 0 points 0 points 0 points 0 points  Months in reverse 0 points 0 points 0 points 0 points 0 points  Repeat phrase 0 points 0 points 0 points 0 points 0 points  Total Score 0 points 0 points 0 points 0 points 0 points    Immunizations Immunization History  Administered Date(s) Administered   Fluad Quad(high Dose 65+) 09/10/2019,  09/14/2022, 10/11/2023   INFLUENZA, HIGH DOSE SEASONAL PF 09/25/2013, 10/30/2018   Influenza,inj,Quad PF,6+ Mos 09/11/2015, 09/20/2016, 08/31/2017   Influenza-Unspecified 10/30/2018, 10/15/2020, 10/01/2021   Moderna Covid-19 Vaccine Bivalent Booster 66yrs & up 10/01/2021   Moderna SARS-COV2 Booster Vaccination 11/03/2020, 04/12/2021   Moderna Sars-Covid-2 Vaccination 01/30/2020, 02/27/2020, 10/11/2023   Pfizer(Comirnaty)Fall Seasonal Vaccine 12 years and older 09/16/2022   Pneumococcal Polysaccharide-23 12/28/2011, 11/18/2013   Tdap 12/27/2010   Zoster Recombinant(Shingrix) 06/09/2018, 08/28/2018   Zoster, Live 08/02/2012    Screening Tests Health Maintenance  Topic Date Due   DTaP/Tdap/Td (2 - Td or Tdap) 12/27/2020   Influenza Vaccine  07/26/2024   COVID-19 Vaccine (8 - 2025-26 season) 08/26/2024   Mammogram  11/06/2024   Pneumococcal Vaccine: 50+ Years (2 of 2 - PCV) 10/29/2024 (Originally 11/18/2014)   Medicare Annual Wellness (AWV)  09/25/2025   DEXA SCAN  12/12/2025   Colonoscopy  06/25/2028   Zoster Vaccines- Shingrix  Completed   Hepatitis C Screening  Addressed   HPV VACCINES  Aged Out   Meningococcal B Vaccine  Aged Out    Health Maintenance Items Addressed: UP TO DATE ON SHOTS;HAS APPT TO HAVE MAMMOGRAM DONE  ON 11/12; UP TO DATE ON BDS; UP TO DATE ON COLONOSCOPY  Additional Screening:  Vision Screening: Recommended annual ophthalmology exams for early detection of glaucoma and other disorders of the eye. Is the patient up to date with their annual eye exam?  Yes  Who is the provider or what is the name of the office in which the patient attends annual eye exams? DR.KING  Dental Screening: Recommended annual dental exams for proper oral hygiene  Community Resource Referral / Chronic Care Management: CRR required this visit?  No   CCM required this visit?  No   Plan:    I have personally reviewed and noted the following in the patient's chart:   Medical  and social history Use of alcohol, tobacco or illicit drugs  Current medications and supplements including opioid prescriptions. Patient is not currently taking opioid prescriptions. Functional ability and status Nutritional status Physical activity Advanced directives List of other physicians Hospitalizations, surgeries, and ER visits in previous 12 months Vitals Screenings to include cognitive, depression, and falls Referrals and appointments  In addition, I have reviewed and discussed with patient certain preventive protocols, quality metrics, and best practice recommendations. A written personalized care plan for preventive services as well as general preventive health recommendations were provided to patient.   Jhonnie GORMAN Das, LPN   [id number]   After Visit Summary: (MyChart) Due to this being a telephonic visit, the after visit summary with patients personalized plan was offered to patient via MyChart   Notes: Nothing significant to report at this time.

## 2024-09-27 ENCOUNTER — Ambulatory Visit: Payer: Medicare (Managed Care) | Attending: Nurse Practitioner | Admitting: Nurse Practitioner

## 2024-09-27 ENCOUNTER — Encounter: Payer: Self-pay | Admitting: Nurse Practitioner

## 2024-09-27 VITALS — BP 112/80 | HR 61 | Ht 66.0 in | Wt 136.2 lb

## 2024-09-27 DIAGNOSIS — I428 Other cardiomyopathies: Secondary | ICD-10-CM | POA: Diagnosis not present

## 2024-09-27 DIAGNOSIS — I10 Essential (primary) hypertension: Secondary | ICD-10-CM | POA: Diagnosis not present

## 2024-09-27 DIAGNOSIS — I5020 Unspecified systolic (congestive) heart failure: Secondary | ICD-10-CM

## 2024-09-27 DIAGNOSIS — I340 Nonrheumatic mitral (valve) insufficiency: Secondary | ICD-10-CM | POA: Diagnosis not present

## 2024-09-27 DIAGNOSIS — E782 Mixed hyperlipidemia: Secondary | ICD-10-CM | POA: Diagnosis not present

## 2024-09-27 NOTE — Patient Instructions (Signed)
 Medication Instructions:  Your physician recommends that you continue on your current medications as directed. Please refer to the Current Medication list given to you today.   *If you need a refill on your cardiac medications before your next appointment, please call your pharmacy*  Lab Work: No labs ordered today  If you have labs (blood work) drawn today and your tests are completely normal, you will receive your results only by: MyChart Message (if you have MyChart) OR A paper copy in the mail If you have any lab test that is abnormal or we need to change your treatment, we will call you to review the results.  Testing/Procedures: No test ordered today   Follow-Up: At [facility], you and your health needs are our priority.  As part of our continuing mission to provide you with exceptional heart care, our providers are all part of one team.  This team includes your primary Cardiologist (physician) and Advanced Practice Providers or APPs (Physician Assistants and Nurse Practitioners) who all work together to provide you with the care you need, when you need it.  Your next appointment:   6 month(s)  Provider:   Redell Cave, MD    We recommend signing up for the patient portal called MyChart.  Sign up information is provided on this After Visit Summary.  MyChart is used to connect with patients for Virtual Visits (Telemedicine).  Patients are able to view lab/test results, encounter notes, upcoming appointments, etc.  Non-urgent messages can be sent to your provider as well.   To learn more about what you can do with MyChart, go to [email or web address].

## 2024-09-27 NOTE — Progress Notes (Signed)
 Office Visit    Patient Name: Krista Peters Date of Encounter: 09/27/2024  Primary Care Provider:  Justus Leita DEL, MD Primary Cardiologist:  Redell Cave, MD    Chief Complaint    78 y.o. female w/ a h/o NICM, HFmrEF, nl coronary arteries, HTN, HL, diastolic dysfxn, hypothyroidism, mild-mod MR/TR, and depression,  who presents for heart failure and hypertension follow-up.   Past Medical History   Subjective   Past Medical History:  Diagnosis Date   Depression    Grade I diastolic dysfunction    Hearing aid worn    bilateral   Hypertension    Hypothyroidism    nodules on thyroid    Mitral regurgitation    a. 11/2022 Echo: Mild-mod MR; b. 04/2024 Echo: mild-mod MR.   Mixed hyperlipidemia    NICM (nonischemic cardiomyopathy) (HCC)    a. 11/2022 Echo: EF 45-50%, glob HK, GrI DD, nl RV size/fxn, mild-mod MR/TR; b. 01/2023 Cor CTA: Ca2+ = 0. Nl cors; c. 04/2024 Echo: EF of 40 to 45%, nl RV function, RVSP 22.5 mmHg, mild to mod MR, mod TR, and Ao sclerosis.   Nodule of lower lobe of right lung    a. 01/2023 CT chest: 4mm RLL nodule->no f/u if low-risk.   Tricuspid regurgitation    a. 11/2022 Echo: Mild-mod TR; b. 04/2024 Echo: Mod TR.   Past Surgical History:  Procedure Laterality Date   APPENDECTOMY     BIOPSY THYROID   2020   BUNIONECTOMY Left 02/27/2019   Procedure: DOUBLE OSTECTOMY;  Surgeon: Ashley Soulier, DPM;  Location: [facility];  Service: Podiatry;  Laterality: Left;  GENERAL WITH LOCAL   CATARACT EXTRACTION W/PHACO Right 01/16/2023   Procedure: CATARACT EXTRACTION PHACO AND INTRAOCULAR LENS PLACEMENT (IOC) RIGHT EYHANCE TORIC  6.10  00:35.9;  Surgeon: Myrna Adine Anes, MD;  Location: [facility];  Service: Ophthalmology;  Laterality: Right;   CATARACT EXTRACTION W/PHACO Left 01/30/2023   Procedure: CATARACT EXTRACTION PHACO AND INTRAOCULAR LENS PLACEMENT (IOC) LEFT EYHANCE TORIC 4.81 00:28.2;  Surgeon: Myrna Adine Anes, MD;  Location: [facility];  Service: Ophthalmology;  Laterality: Left;   COLONOSCOPY  2015   Large adenoma removed laparoscopically   COLONOSCOPY WITH PROPOFOL  N/A 11/06/2017   Procedure: COLONOSCOPY WITH PROPOFOL ;  Surgeon: Jinny Carmine, MD;  Location: [facility];  Service: Endoscopy;  Laterality: N/A;   COLONOSCOPY WITH PROPOFOL  N/A 06/26/2023   Procedure: COLONOSCOPY WITH PROPOFOL ;  Surgeon: Jinny Carmine, MD;  Location: [facility];  Service: Endoscopy;  Laterality: N/A;   LAPAROSCOPIC ILEOCECECTOMY  2015   Serrated adenoma   TONSILLECTOMY      Allergies  Allergies  Allergen Reactions   Lipitor [Atorvastatin ] Other (See Comments)    Leg pain   Naproxen Rash   Other Rash    OTC cold pills cause rash.  Liquid meds are OK   Penicillins Rash   Pneumovax 23 [Pneumococcal Vac Polyvalent] Rash       History of Present Illness      78 y.o. y/o female w/ a h/o NICM, nl coronary arteries, HTN, HL, diastolic dysfxn, hypothyroidism, mild-mod MR/TR, and depression.  She previously established care in 12/2022 following echo in 11/2022, which showed mildly reduced EF @ 45-50% w/ mild-mod MR/TR.  Coronary CTA was performed and showed nl coronary arteries w/ calcium  score of 0.  She was incidentally noted to have a 4mm RLL nodule.  She was subsequently placed on ? blocker and ARB.  Beta-blocker therapy was  transition from metoprolol  to carvedilol  in June 2024 in the setting of elevated blood pressures.      Krista Peters was last seen in cardiology clinic in April 2025, at which time she reported an episode of back tightness.  ECG showed anterolateral T wave inversion.  Echocardiogram was performed and showed stable moderate LV dysfunction with EF of 40 to 45%, normal RV function, RVSP 22.5 mmHg, mild to moderate MR, moderate TR, and aortic sclerosis.  Medical therapy was continued.  Over the past several months, she has done well.  She is not routinely exercising but states she stays active in  and around her home.  She has no limitations activity.  She denies chest pain, palpitations, dizziness, syncope, PND, orthopnea, or early satiety.  She monitors her blood pressures roughly once a week or so at home with trends typically in the 1 teens to 130s, though rarely will know pressures in the 150s to 160s. Objective   Home Medications    Current Outpatient Medications  Medication Sig Dispense Refill   buPROPion  (WELLBUTRIN ) 75 MG tablet TAKE 1 TABLET(75 MG) BY MOUTH DAILY AT 6 AM 90 tablet 1   carvedilol  (COREG ) 25 MG tablet Take 1 tablet (25 mg total) by mouth 2 (two) times daily. 180 tablet 3   levothyroxine (SYNTHROID) 25 MCG tablet Take 25 mcg by mouth daily before breakfast.     losartan  (COZAAR ) 100 MG tablet Take 1 tablet (100 mg total) by mouth daily. 90 tablet 1   oxybutynin  (DITROPAN ) 5 MG tablet TAKE 1 TABLET(5 MG) BY MOUTH DAILY 90 tablet 3   pravastatin  (PRAVACHOL ) 10 MG tablet TAKE 1 TABLET(10 MG) BY MOUTH DAILY 90 tablet 1   OVER THE COUNTER MEDICATION Macular shield (vitamins) (Patient not taking: Reported on 09/27/2024)     No current facility-administered medications for this visit.     Physical Exam    VS:  BP 112/80 (BP Location: Left Arm, Patient Position: Sitting, Cuff Size: Normal)   Pulse 61   Ht 5' 6 (1.676 m)   Wt 136 lb 4 oz (61.8 kg)   SpO2 98%   BMI 21.99 kg/m  , BMI Body mass index is 21.99 kg/m.          GEN: Well nourished, well developed, in no acute distress. HEENT: normal. Neck: Supple, no JVD, carotid bruits, or masses. Cardiac: RRR, no murmurs, rubs, or gallops. No clubbing, cyanosis, edema.  Radials 2+/PT 2+ and equal bilaterally.  Respiratory:  Respirations regular and unlabored, clear to auscultation bilaterally. GI: Soft, nontender, nondistended, BS + x 4. MS: no deformity or atrophy. Skin: warm and dry, no rash. Neuro:  Strength and sensation are intact. Psych: Normal affect.  Accessory Clinical Findings    ECG personally  reviewed by me today - EKG Interpretation Date/Time:  Friday September 27 2024 09:15:23 EDT Ventricular Rate:  61 PR Interval:  160 QRS Duration:  86 QT Interval:  410 QTC Calculation: 412 R Axis:   48  Text Interpretation: Normal sinus rhythm Normal ECG Confirmed by Vivienne Bruckner [phone]) on 09/27/2024 9:26:05 AM  - no acute changes.  Lab Results  Component Value Date   WBC 5.3 10/30/2023   HGB 12.6 10/30/2023   HCT 39.3 10/30/2023   MCV 99 (H) 10/30/2023   PLT 229 10/30/2023   Lab Results  Component Value Date   CREATININE 0.69 10/30/2023   BUN 11 10/30/2023   NA 140 10/30/2023   K 4.3 10/30/2023   CL 101  10/30/2023   CO2 26 10/30/2023   Lab Results  Component Value Date   ALT 10 10/30/2023   AST 15 10/30/2023   ALKPHOS 79 10/30/2023   BILITOT 0.5 10/30/2023   Lab Results  Component Value Date   CHOL 169 10/30/2023   HDL 65 10/30/2023   LDLCALC 91 10/30/2023   TRIG 69 10/30/2023   CHOLHDL 2.6 10/30/2023    Lab Results  Component Value Date   TSH 1.870 10/21/2020       Assessment & Plan    1.  Nonischemic cardiomyopathy/chronic heart failure with midrange ejection fraction: Most recent echo in May 2025 showed an EF of 40 to 45% with normal RV function, mild to moderate MR, moderate TR, and aortic valve sclerosis.  She has done well over the past 6 months without chest pain, dyspnea, or edema.  She is euvolemic on examination today.  Heart rate and blood pressure stable.  We discussed optimization of her medical therapy which currently includes carvedilol  and losartan .  Discussion included transitioning losartan  to Entresto, potentially adding spironolactone, and potentially adding SGLT2 inhibitor.  She has never been hospitalized for heart failure and does not currently require daily diuretic.  Following her discussion, she has opted to continue current regimen.  2.  Primary hypertension: Patient brought blood pressure trends over the past several months and for  the most part, she is trending in the 1 teens to 130s with occasional higher pressures.  In light of relative stability, we will continue current regimen.  If pressures elevate in the future, would have a low threshold to either transition her to Entresto or add spironolactone in light of #1.  3.  Hyperlipidemia: LDL 90 27 October 2023 with normal LFTs.  She had normal coronary arteries in May 2024.  She remains on pravastatin  therapy.  4.  Mild to moderate mitral regurgitation/moderate tricuspid regurgitation: Slight worsening of TR on echo in May 2025 with stable mitral regurgitation.  Asymptomatic.  I do not appreciate a murmur on examination.  5.  Disposition: Follow-up in 6 months or sooner if necessary.  Lonni Meager, NP 09/27/2024, 9:55 AM

## 2024-10-06 ENCOUNTER — Other Ambulatory Visit: Payer: Self-pay | Admitting: Internal Medicine

## 2024-10-06 DIAGNOSIS — E782 Mixed hyperlipidemia: Secondary | ICD-10-CM

## 2024-10-08 NOTE — Telephone Encounter (Signed)
 Requested Prescriptions  Pending Prescriptions Disp Refills   pravastatin  (PRAVACHOL ) 10 MG tablet [Pharmacy Med Name: PRAVASTATIN  10MG  TABLETS] 90 tablet 0    Sig: TAKE 1 TABLET(10 MG) BY MOUTH DAILY     Cardiovascular:  Antilipid - Statins Failed - 10/08/2024  3:20 PM      Failed - Lipid Panel in normal range within the last 12 months    Cholesterol, Total  Date Value Ref Range Status  10/30/2023 169 100 - 199 mg/dL Final   LDL Chol Calc (NIH)  Date Value Ref Range Status  10/30/2023 91 0 - 99 mg/dL Final   HDL  Date Value Ref Range Status  10/30/2023 65 >39 mg/dL Final   Triglycerides  Date Value Ref Range Status  10/30/2023 69 0 - 149 mg/dL Final         Passed - Patient is not pregnant      Passed - Valid encounter within last 12 months    Recent Outpatient Visits           3 months ago Essential hypertension   [place] at [facility], Leita DEL, MD       Future Appointments             In 3 weeks Justus, Leita DEL, MD [facility] at [facility], [phone] [place]

## 2024-10-31 ENCOUNTER — Ambulatory Visit (INDEPENDENT_AMBULATORY_CARE_PROVIDER_SITE_OTHER): Payer: Medicare (Managed Care) | Admitting: Internal Medicine

## 2024-10-31 ENCOUNTER — Encounter: Payer: Self-pay | Admitting: Internal Medicine

## 2024-10-31 VITALS — BP 116/74 | HR 69 | Ht 66.0 in | Wt 135.0 lb

## 2024-10-31 DIAGNOSIS — N3941 Urge incontinence: Secondary | ICD-10-CM | POA: Diagnosis not present

## 2024-10-31 DIAGNOSIS — F325 Major depressive disorder, single episode, in full remission: Secondary | ICD-10-CM | POA: Diagnosis not present

## 2024-10-31 DIAGNOSIS — Z1231 Encounter for screening mammogram for malignant neoplasm of breast: Secondary | ICD-10-CM

## 2024-10-31 DIAGNOSIS — I10 Essential (primary) hypertension: Secondary | ICD-10-CM

## 2024-10-31 DIAGNOSIS — M810 Age-related osteoporosis without current pathological fracture: Secondary | ICD-10-CM | POA: Diagnosis not present

## 2024-10-31 DIAGNOSIS — E041 Nontoxic single thyroid nodule: Secondary | ICD-10-CM | POA: Diagnosis not present

## 2024-10-31 DIAGNOSIS — E782 Mixed hyperlipidemia: Secondary | ICD-10-CM

## 2024-10-31 DIAGNOSIS — Z0000 Encounter for general adult medical examination without abnormal findings: Secondary | ICD-10-CM | POA: Diagnosis not present

## 2024-10-31 NOTE — Progress Notes (Signed)
 Date:  10/31/2024   Name:  Krista Peters   DOB:  07/19/1946   MRN:  [id number]   Chief Complaint: Annual Exam Krista Peters is a 78 y.o. female who presents today for her Complete Annual Exam. She feels well. She reports exercising none. She reports she is sleeping well. Breast complaints none.  Health Maintenance  Topic Date Due   DTaP/Tdap/Td vaccine (2 - Td or Tdap) 12/27/2020   Breast Cancer Screening  11/06/2024   Pneumococcal Vaccine for age over 59 (2 of 2 - PCV) 10/31/2025*   COVID-19 Vaccine (9 - 2025-26 season) 11/16/2025*   Medicare Annual Wellness Visit  09/25/2025   DEXA scan (bone density measurement)  12/12/2025   Colon Cancer Screening  06/25/2028   Flu Shot  Completed   Zoster (Shingles) Vaccine  Completed   Hepatitis C Screening  Addressed   Meningitis B Vaccine  Aged Out  *Topic was postponed. The date shown is not the original due date.    Hypertension This is a chronic problem. The problem is controlled. Pertinent negatives include no chest pain, headaches, palpitations or shortness of breath. Past treatments include angiotensin blockers and beta blockers. The current treatment provides significant improvement. There is no history of kidney disease, CAD/MI or CVA. Identifiable causes of hypertension include a thyroid  problem.  Hyperlipidemia This is a chronic problem. The problem is controlled. Pertinent negatives include no chest pain, myalgias or shortness of breath. Current antihyperlipidemic treatment includes statins. The current treatment provides significant improvement of lipids.  Thyroid  Problem Presents for follow-up visit. Patient reports no constipation, diarrhea, fatigue or palpitations. The symptoms have been stable. Her past medical history is significant for hyperlipidemia.  Depression        This is a chronic problem.  The problem has been resolved since onset.  Associated symptoms include no fatigue, no myalgias and no headaches.   Treatments tried: bupropion .  Compliance with treatment is good.  Previous treatment provided significant relief.  Past medical history includes thyroid  problem.     Review of Systems  Constitutional:  Negative for fatigue and unexpected weight change.  HENT:  Negative for trouble swallowing.   Eyes:  Negative for visual disturbance.  Respiratory:  Negative for cough, chest tightness, shortness of breath and wheezing.   Cardiovascular:  Negative for chest pain, palpitations and leg swelling.  Gastrointestinal:  Negative for abdominal pain, constipation and diarrhea.  Musculoskeletal:  Negative for arthralgias and myalgias.  Neurological:  Negative for dizziness, weakness, light-headedness and headaches.  Psychiatric/Behavioral:  Positive for depression.      Lab Results  Component Value Date   NA 140 10/30/2023   K 4.3 10/30/2023   CO2 26 10/30/2023   GLUCOSE 97 10/30/2023   BUN 11 10/30/2023   CREATININE 0.69 10/30/2023   CALCIUM  9.0 10/30/2023   EGFR 90 10/30/2023   GFRNONAA >60 12/30/2022   Lab Results  Component Value Date   CHOL 169 10/30/2023   HDL 65 10/30/2023   LDLCALC 91 10/30/2023   TRIG 69 10/30/2023   CHOLHDL 2.6 10/30/2023   Lab Results  Component Value Date   TSH 1.870 10/21/2020   No results found for: HGBA1C Lab Results  Component Value Date   WBC 5.3 10/30/2023   HGB 12.6 10/30/2023   HCT 39.3 10/30/2023   MCV 99 (H) 10/30/2023   PLT 229 10/30/2023   Lab Results  Component Value Date   ALT 10 10/30/2023   AST 15 10/30/2023  ALKPHOS 79 10/30/2023   BILITOT 0.5 10/30/2023   Lab Results  Component Value Date   VD25OH 62.0 10/25/2021     Patient Active Problem List   Diagnosis Date Noted   History of colonic polyps 06/26/2023   Mitral regurgitation 04/27/2023   Essential hypertension 04/27/2023   Nontoxic single thyroid  nodule 09/16/2019   Mass of skin of left hand 04/28/2018   Osteoporosis of multiple sites without pathological  fracture 10/21/2016   Urge incontinence of urine 09/11/2015   Major depression, single episode, in complete remission 09/11/2015   Mixed hyperlipidemia 09/11/2015   Irritable bowel syndrome without diarrhea 09/11/2015   Hx of adenomatous colonic polyps 08/27/2014   Adhesive capsulitis 03/26/2014   Disequilibrium 06/13/2008    Allergies  Allergen Reactions   Lipitor [Atorvastatin ] Other (See Comments)    Leg pain   Naproxen Rash   Other Rash    OTC cold pills cause rash.  Liquid meds are OK   Penicillins Rash   Pneumovax 23 [Pneumococcal Vac Polyvalent] Rash    Past Surgical History:  Procedure Laterality Date   APPENDECTOMY     BIOPSY THYROID   2020   BUNIONECTOMY Left 02/27/2019   Procedure: DOUBLE OSTECTOMY;  Surgeon: Ashley Soulier, DPM;  Location: [facility];  Service: Podiatry;  Laterality: Left;  GENERAL WITH LOCAL   CATARACT EXTRACTION W/PHACO Right 01/16/2023   Procedure: CATARACT EXTRACTION PHACO AND INTRAOCULAR LENS PLACEMENT (IOC) RIGHT EYHANCE TORIC  6.10  00:35.9;  Surgeon: Myrna Adine Anes, MD;  Location: [facility];  Service: Ophthalmology;  Laterality: Right;   CATARACT EXTRACTION W/PHACO Left 01/30/2023   Procedure: CATARACT EXTRACTION PHACO AND INTRAOCULAR LENS PLACEMENT (IOC) LEFT EYHANCE TORIC 4.81 00:28.2;  Surgeon: Myrna Adine Anes, MD;  Location: [facility];  Service: Ophthalmology;  Laterality: Left;   COLONOSCOPY  2015   Large adenoma removed laparoscopically   COLONOSCOPY WITH PROPOFOL  N/A 11/06/2017   Procedure: COLONOSCOPY WITH PROPOFOL ;  Surgeon: Jinny Carmine, MD;  Location: [facility];  Service: Endoscopy;  Laterality: N/A;   COLONOSCOPY WITH PROPOFOL  N/A 06/26/2023   Procedure: COLONOSCOPY WITH PROPOFOL ;  Surgeon: Jinny Carmine, MD;  Location: [facility];  Service: Endoscopy;  Laterality: N/A;   LAPAROSCOPIC ILEOCECECTOMY  2015   Serrated adenoma   TONSILLECTOMY      Social History   Tobacco Use    Smoking status: Former    Current packs/day: 0.00    Average packs/day: 0.3 packs/day for 10.0 years (2.5 ttl pk-yrs)    Types: Cigarettes    Start date: 20    Quit date: 1970    Years since quitting: 55.8   Smokeless tobacco: Never   Tobacco comments:    smoking cessation materials not required  Vaping Use   Vaping status: Never Used  Substance Use Topics   Alcohol use: Yes    Alcohol/week: 10.0 standard drinks of alcohol    Types: 5 Glasses of wine, 5 Cans of beer per week    Comment: 1 beer or wine per day   Drug use: Never     Medication list has been reviewed and updated.  Current Meds  Medication Sig   buPROPion  (WELLBUTRIN ) 75 MG tablet TAKE 1 TABLET(75 MG) BY MOUTH DAILY AT 6 AM   carvedilol  (COREG ) 25 MG tablet Take 1 tablet (25 mg total) by mouth 2 (two) times daily.   fluticasone (FLONASE) 50 MCG/ACT nasal spray Place 2 sprays into both nostrils daily.   levothyroxine (SYNTHROID) 25 MCG tablet  Take 25 mcg by mouth daily before breakfast.   losartan  (COZAAR ) 100 MG tablet Take 1 tablet (100 mg total) by mouth daily.   oxybutynin  (DITROPAN ) 5 MG tablet TAKE 1 TABLET(5 MG) BY MOUTH DAILY   pravastatin  (PRAVACHOL ) 10 MG tablet TAKE 1 TABLET(10 MG) BY MOUTH DAILY       06/14/2024   10:22 AM 10/30/2023    8:00 AM 08/15/2023    1:43 PM 04/27/2023    8:06 AM  GAD 7 : Generalized Anxiety Score  Nervous, Anxious, on Edge 0 0 0 0  Control/stop worrying 0 0 0 0  Worry too much - different things 0 0 0 1  Trouble relaxing 0 1 0 1  Restless 0 0 0 0  Easily annoyed or irritable 0 0 0 0  Afraid - awful might happen 0 0 0 0  Total GAD 7 Score 0 1 0 2  Anxiety Difficulty Not difficult at all Not difficult at all Not difficult at all Not difficult at all       09/25/2024    9:50 AM 06/14/2024   10:22 AM 10/30/2023    8:00 AM  Depression screen PHQ 2/9  Decreased Interest 0 0 1  Down, Depressed, Hopeless 0 0 0  PHQ - 2 Score 0 0 1  Altered sleeping 0 0 0  Tired,  decreased energy 0 0 1  Change in appetite 0 0 0  Feeling bad or failure about yourself  0 0 0  Trouble concentrating 0 0 0  Moving slowly or fidgety/restless 0 0 0  Suicidal thoughts 0 0 0  PHQ-9 Score 0 0 2  Difficult doing work/chores Not difficult at all Not difficult at all Not difficult at all    BP Readings from Last 3 Encounters:  10/31/24 116/74  09/27/24 112/80  06/14/24 126/78    Physical Exam Vitals and nursing note reviewed.  Constitutional:      General: She is not in acute distress.    Appearance: She is well-developed.  HENT:     Head: Normocephalic and atraumatic.     Right Ear: Tympanic membrane and ear canal normal.     Left Ear: Tympanic membrane and ear canal normal.     Nose:     Right Sinus: No maxillary sinus tenderness.     Left Sinus: No maxillary sinus tenderness.  Eyes:     General: No scleral icterus.       Right eye: No discharge.        Left eye: No discharge.     Conjunctiva/sclera: Conjunctivae normal.  Neck:     Thyroid : No thyromegaly.     Vascular: No carotid bruit.  Cardiovascular:     Rate and Rhythm: Normal rate and regular rhythm.     Pulses: Normal pulses.     Heart sounds: Normal heart sounds.  Pulmonary:     Effort: Pulmonary effort is normal. No respiratory distress.     Breath sounds: No wheezing.  Abdominal:     General: Bowel sounds are normal.     Palpations: Abdomen is soft.     Tenderness: There is no abdominal tenderness.  Musculoskeletal:     Cervical back: Normal range of motion. No erythema.     Right lower leg: No edema.     Left lower leg: No edema.  Lymphadenopathy:     Cervical: No cervical adenopathy.  Skin:    General: Skin is warm and dry.     Findings:  No rash.  Neurological:     Mental Status: She is alert and oriented to person, place, and time.     Cranial Nerves: No cranial nerve deficit.     Sensory: No sensory deficit.     Deep Tendon Reflexes: Reflexes are normal and symmetric.   Psychiatric:        Attention and Perception: Attention normal.        Mood and Affect: Mood normal.     Wt Readings from Last 3 Encounters:  10/31/24 135 lb (61.2 kg)  09/27/24 136 lb 4 oz (61.8 kg)  06/14/24 136 lb (61.7 kg)    BP 116/74   Pulse 69   Ht 5' 6 (1.676 m)   Wt 135 lb (61.2 kg)   SpO2 99%   BMI 21.79 kg/m   Assessment and Plan:  Problem List Items Addressed This Visit       Unprioritized   Urge incontinence of urine   Symptoms well controlled on ditropan .      Major depression, single episode, in complete remission (Chronic)   Depression and anxiety symptoms are stable and well controlled on bupropion . No SI/HI reported. I recommend continuing the same medical regimen.       Mixed hyperlipidemia (Chronic)   LDL is  Lab Results  Component Value Date   LDLCALC 91 10/30/2023    Current medication regimen is pravastatin . Goal LDL is < 100.       Relevant Orders   Lipid panel   Osteoporosis of multiple sites without pathological fracture (Chronic)   DEXA last year showed mild worsening at the femur but stable spine/total hip osteopenia      Nontoxic single thyroid  nodule (Chronic)   Followed by Dr. Juengel - did not go in 2025 due to insurance Will need a referral after Jan 1 to see him again -insurance change      Essential hypertension (Chronic)   Well controlled blood pressure today. Current regimen is losartan  and metoprolol . No medication side effects noted.        Relevant Orders   CBC with Differential/Platelet   Comprehensive metabolic panel with GFR   Urinalysis, Routine w reflex microscopic   Other Visit Diagnoses       Annual physical exam    -  Primary   immunizations and screenings are up to date     Encounter for screening mammogram for breast cancer       scheduled at [facility]       Return in about 6 months (around 04/30/2025) for HTN Dr. Lemon.    Leita HILARIO Adie, MD [facility] [place]

## 2024-10-31 NOTE — Assessment & Plan Note (Signed)
 Well controlled blood pressure today. Current regimen is losartan  and metoprolol . No medication side effects noted.

## 2024-10-31 NOTE — Assessment & Plan Note (Signed)
 DEXA last year showed mild worsening at the femur but stable spine/total hip osteopenia

## 2024-10-31 NOTE — Assessment & Plan Note (Signed)
 Followed by Dr. Juengel - did not go in 2025 due to insurance Will need a referral after Jan 1 to see him again -insurance change

## 2024-10-31 NOTE — Assessment & Plan Note (Signed)
 Symptoms well controlled on ditropan .

## 2024-10-31 NOTE — Assessment & Plan Note (Signed)
 Depression and anxiety symptoms are stable and well controlled on bupropion . No SI/HI reported. I recommend continuing the same medical regimen.

## 2024-10-31 NOTE — Assessment & Plan Note (Signed)
 LDL is  Lab Results  Component Value Date   LDLCALC 91 10/30/2023    Current medication regimen is pravastatin . Goal LDL is < 100.

## 2024-11-01 ENCOUNTER — Ambulatory Visit: Payer: Self-pay | Admitting: Internal Medicine

## 2024-11-01 ENCOUNTER — Encounter: Payer: Self-pay | Admitting: Internal Medicine

## 2024-11-01 LAB — MICROSCOPIC EXAMINATION
Bacteria, UA: NONE SEEN
Casts: NONE SEEN /LPF
WBC, UA: NONE SEEN /HPF (ref 0–5)

## 2024-11-01 LAB — URINALYSIS, ROUTINE W REFLEX MICROSCOPIC
Bilirubin, UA: NEGATIVE
Glucose, UA: NEGATIVE
Ketones, UA: NEGATIVE
Nitrite, UA: NEGATIVE
Protein,UA: NEGATIVE
RBC, UA: NEGATIVE
Specific Gravity, UA: 1.019 (ref 1.005–1.030)
Urobilinogen, Ur: 0.2 mg/dL (ref 0.2–1.0)
pH, UA: 6 (ref 5.0–7.5)

## 2024-11-01 LAB — CBC WITH DIFFERENTIAL/PLATELET
Basophils Absolute: 0.1 x10E3/uL (ref 0.0–0.2)
Basos: 1 %
EOS (ABSOLUTE): 0.2 x10E3/uL (ref 0.0–0.4)
Eos: 4 %
Hematocrit: 38 % (ref 34.0–46.6)
Hemoglobin: 12.4 g/dL (ref 11.1–15.9)
Immature Grans (Abs): 0 x10E3/uL (ref 0.0–0.1)
Immature Granulocytes: 0 %
Lymphocytes Absolute: 1.8 x10E3/uL (ref 0.7–3.1)
Lymphs: 38 %
MCH: 32.2 pg (ref 26.6–33.0)
MCHC: 32.6 g/dL (ref 31.5–35.7)
MCV: 99 fL — ABNORMAL HIGH (ref 79–97)
Monocytes Absolute: 0.4 x10E3/uL (ref 0.1–0.9)
Monocytes: 10 %
Neutrophils Absolute: 2.2 x10E3/uL (ref 1.4–7.0)
Neutrophils: 47 %
Platelets: 221 x10E3/uL (ref 150–450)
RBC: 3.85 x10E6/uL (ref 3.77–5.28)
RDW: 11.8 % (ref 11.7–15.4)
WBC: 4.6 x10E3/uL (ref 3.4–10.8)

## 2024-11-01 LAB — COMPREHENSIVE METABOLIC PANEL WITH GFR
ALT: 13 IU/L (ref 0–32)
AST: 15 IU/L (ref 0–40)
Albumin: 4.2 g/dL (ref 3.8–4.8)
Alkaline Phosphatase: 75 IU/L (ref 49–135)
BUN/Creatinine Ratio: 18 (ref 12–28)
BUN: 14 mg/dL (ref 8–27)
Bilirubin Total: 0.5 mg/dL (ref 0.0–1.2)
CO2: 25 mmol/L (ref 20–29)
Calcium: 9.1 mg/dL (ref 8.7–10.3)
Chloride: 100 mmol/L (ref 96–106)
Creatinine, Ser: 0.76 mg/dL (ref 0.57–1.00)
Globulin, Total: 2.3 g/dL (ref 1.5–4.5)
Glucose: 103 mg/dL — ABNORMAL HIGH (ref 70–99)
Potassium: 4.6 mmol/L (ref 3.5–5.2)
Sodium: 138 mmol/L (ref 134–144)
Total Protein: 6.5 g/dL (ref 6.0–8.5)
eGFR: 81 mL/min/1.73 (ref >59)

## 2024-11-01 LAB — LIPID PANEL
Chol/HDL Ratio: 3 ratio (ref 0.0–4.4)
Cholesterol, Total: 183 mg/dL (ref 100–199)
HDL: 62 mg/dL (ref >39)
LDL Chol Calc (NIH): 106 mg/dL — ABNORMAL HIGH (ref 0–99)
Triglycerides: 84 mg/dL (ref 0–149)
VLDL Cholesterol Cal: 15 mg/dL (ref 5–40)

## 2024-11-01 NOTE — Telephone Encounter (Signed)
 Please review and advise.  JM

## 2024-11-06 ENCOUNTER — Other Ambulatory Visit: Payer: Self-pay | Admitting: Internal Medicine

## 2024-11-06 DIAGNOSIS — F325 Major depressive disorder, single episode, in full remission: Secondary | ICD-10-CM

## 2024-11-06 DIAGNOSIS — Z1231 Encounter for screening mammogram for malignant neoplasm of breast: Secondary | ICD-10-CM | POA: Diagnosis not present

## 2024-11-06 LAB — HM MAMMOGRAPHY

## 2024-11-07 ENCOUNTER — Encounter: Payer: Self-pay | Admitting: Internal Medicine

## 2024-11-08 NOTE — Telephone Encounter (Signed)
 Requested Prescriptions  Pending Prescriptions Disp Refills   buPROPion  (WELLBUTRIN ) 75 MG tablet [Pharmacy Med Name: BUPROPION  75MG  TABLETS] 90 tablet 1    Sig: TAKE 1 TABLET(75 MG) BY MOUTH DAILY AT 6 AM     Psychiatry: Antidepressants - bupropion  Passed - 11/08/2024 12:34 PM      Passed - Cr in normal range and within 360 days    Creatinine  Date Value Ref Range Status  10/14/2014 0.56 (L) 0.60 - 1.30 mg/dL Final   Creatinine, Ser  Date Value Ref Range Status  10/31/2024 0.76 0.57 - 1.00 mg/dL Final         Passed - AST in normal range and within 360 days    AST  Date Value Ref Range Status  10/31/2024 15 0 - 40 IU/L Final   SGOT(AST)  Date Value Ref Range Status  10/09/2014 12 (L) 15 - 37 Unit/L Final         Passed - ALT in normal range and within 360 days    ALT  Date Value Ref Range Status  10/31/2024 13 0 - 32 IU/L Final   SGPT (ALT)  Date Value Ref Range Status  10/09/2014 19 U/L Final    Comment:    14-63 NOTE: New Reference Range 07/15/14          Passed - Completed PHQ-2 or PHQ-9 in the last 360 days      Passed - Last BP in normal range    BP Readings from Last 1 Encounters:  10/31/24 116/74         Passed - Valid encounter within last 6 months    Recent Outpatient Visits           1 week ago Annual physical exam   [facility] at [facility], Leita DEL, MD   4 months ago Essential hypertension   [facility] at [facility], Leita DEL, MD

## 2024-11-12 DIAGNOSIS — Z859 Personal history of malignant neoplasm, unspecified: Secondary | ICD-10-CM | POA: Diagnosis not present

## 2024-11-12 DIAGNOSIS — L578 Other skin changes due to chronic exposure to nonionizing radiation: Secondary | ICD-10-CM | POA: Diagnosis not present

## 2024-11-12 DIAGNOSIS — L821 Other seborrheic keratosis: Secondary | ICD-10-CM | POA: Diagnosis not present

## 2024-11-12 DIAGNOSIS — Z872 Personal history of diseases of the skin and subcutaneous tissue: Secondary | ICD-10-CM | POA: Diagnosis not present

## 2024-11-12 DIAGNOSIS — Z86018 Personal history of other benign neoplasm: Secondary | ICD-10-CM | POA: Diagnosis not present

## 2024-11-12 DIAGNOSIS — L570 Actinic keratosis: Secondary | ICD-10-CM | POA: Diagnosis not present

## 2024-12-09 ENCOUNTER — Other Ambulatory Visit: Payer: Self-pay | Admitting: Internal Medicine

## 2024-12-10 ENCOUNTER — Other Ambulatory Visit: Payer: Self-pay | Admitting: Internal Medicine

## 2024-12-10 DIAGNOSIS — I10 Essential (primary) hypertension: Secondary | ICD-10-CM

## 2024-12-11 NOTE — Telephone Encounter (Signed)
 Requested Prescriptions  Pending Prescriptions Disp Refills   oxybutynin  (DITROPAN ) 5 MG tablet [Pharmacy Med Name: OXYBUTYNIN  5MG  TABLETS] 90 tablet 1    Sig: TAKE 1 TABLET(5 MG) BY MOUTH DAILY     Urology:  Bladder Agents Passed - 12/11/2024  3:44 PM      Passed - Valid encounter within last 12 months    Recent Outpatient Visits           1 month ago Annual physical exam   [facility] at [facility], Leita DEL, MD   6 months ago Essential hypertension   [facility] at [facility], Leita DEL, MD

## 2024-12-12 NOTE — Telephone Encounter (Signed)
 Requested Prescriptions  Pending Prescriptions Disp Refills   losartan  (COZAAR ) 100 MG tablet [Pharmacy Med Name: LOSARTAN  100MG  TABLETS] 90 tablet 0    Sig: TAKE 1 TABLET(100 MG) BY MOUTH DAILY     Cardiovascular:  Angiotensin Receptor Blockers Passed - 12/12/2024  3:35 PM      Passed - Cr in normal range and within 180 days    Creatinine  Date Value Ref Range Status  10/14/2014 0.56 (L) 0.60 - 1.30 mg/dL Final   Creatinine, Ser  Date Value Ref Range Status  10/31/2024 0.76 0.57 - 1.00 mg/dL Final         Passed - K in normal range and within 180 days    Potassium  Date Value Ref Range Status  10/31/2024 4.6 3.5 - 5.2 mmol/L Final  10/14/2014 3.8 3.5 - 5.1 mmol/L Final         Passed - Patient is not pregnant      Passed - Last BP in normal range    BP Readings from Last 1 Encounters:  10/31/24 116/74         Passed - Valid encounter within last 6 months    Recent Outpatient Visits           1 month ago Annual physical exam   [facility], Leita DEL, MD   6 months ago Essential hypertension   [facility] at [facility], Leita DEL, MD

## 2024-12-27 ENCOUNTER — Telehealth: Payer: Self-pay | Admitting: Internal Medicine

## 2024-12-27 NOTE — Telephone Encounter (Signed)
 Copied from CRM [id number]. Topic: Referral - Request for Referral >> Dec 27, 2024 11:39 AM Mia F wrote: Did the patient discuss referral with their provider in the last year? Yes (If No - schedule appointment) (If Yes - send message)  Appointment offered? No  Type of order/referral and detailed reason for visit: Pt says she see this provider for her thyroid  but she now has insurance that requires a referral   Preference of office, provider, location: Dr Juengel ENT   If referral order, have you been seen by this specialty before? Yes (If Yes, this issue or another issue? When? Where?  Can we respond through MyChart? No

## 2024-12-30 ENCOUNTER — Other Ambulatory Visit: Payer: Self-pay

## 2024-12-30 DIAGNOSIS — E041 Nontoxic single thyroid nodule: Secondary | ICD-10-CM

## 2024-12-30 NOTE — Telephone Encounter (Signed)
 Please review. Pt wants referral.  KP

## 2024-12-30 NOTE — Telephone Encounter (Signed)
 Referral sent.  KP

## 2025-01-07 ENCOUNTER — Telehealth: Payer: Self-pay | Admitting: Student

## 2025-01-07 NOTE — Telephone Encounter (Signed)
 Please send in referral. Referral placed 12/30/24.  KP

## 2025-01-07 NOTE — Telephone Encounter (Signed)
 Copied from CRM #[id number]. Topic: Referral - Question >> Jan 07, 2025  9:55 AM Tiffini S wrote: Reason for CRM: Patient have a appointment with Dr. Juengel, Paul, MD tomorrow at 10am- states that they haven't received the referral yet/ please fax the referral to   Fax number [phone] The patient is asking for a call back at [phone]

## 2025-01-07 NOTE — Telephone Encounter (Signed)
 Pt informed and verbalized understating.  KP

## 2025-01-09 ENCOUNTER — Other Ambulatory Visit: Payer: Self-pay

## 2025-01-09 DIAGNOSIS — E782 Mixed hyperlipidemia: Secondary | ICD-10-CM

## 2025-01-09 MED ORDER — PRAVASTATIN SODIUM 10 MG PO TABS: 10.0000 mg | ORAL_TABLET | Freq: Every day | ORAL | 0 refills | Status: AC

## 2025-05-02 ENCOUNTER — Encounter: Payer: Medicare (Managed Care) | Admitting: Student

## 2025-10-01 ENCOUNTER — Ambulatory Visit: Payer: Medicare (Managed Care)
